# Patient Record
Sex: Female | Born: 1940
Health system: Southern US, Community
[De-identification: ages and names within clinical notes are randomized; demographics above are authoritative.]

## PROBLEM LIST (undated history)

## (undated) DIAGNOSIS — J45909 Unspecified asthma, uncomplicated: Secondary | ICD-10-CM

## (undated) DIAGNOSIS — E785 Hyperlipidemia, unspecified: Secondary | ICD-10-CM

## (undated) DIAGNOSIS — M1990 Unspecified osteoarthritis, unspecified site: Secondary | ICD-10-CM

## (undated) DIAGNOSIS — E7800 Pure hypercholesterolemia, unspecified: Secondary | ICD-10-CM

## (undated) DIAGNOSIS — E669 Obesity, unspecified: Secondary | ICD-10-CM

## (undated) DIAGNOSIS — K635 Polyp of colon: Secondary | ICD-10-CM

## (undated) DIAGNOSIS — I700 Atherosclerosis of aorta: Secondary | ICD-10-CM

## (undated) DIAGNOSIS — F32A Depression, unspecified: Secondary | ICD-10-CM

## (undated) DIAGNOSIS — E1140 Type 2 diabetes mellitus with diabetic neuropathy, unspecified: Secondary | ICD-10-CM

## (undated) DIAGNOSIS — E119 Type 2 diabetes mellitus without complications: Secondary | ICD-10-CM

## (undated) DIAGNOSIS — F329 Major depressive disorder, single episode, unspecified: Secondary | ICD-10-CM

## (undated) DIAGNOSIS — I10 Essential (primary) hypertension: Secondary | ICD-10-CM

## (undated) DIAGNOSIS — J309 Allergic rhinitis, unspecified: Secondary | ICD-10-CM

## (undated) HISTORY — DX: Major depressive disorder, single episode, unspecified: F32.9

## (undated) HISTORY — DX: Depression, unspecified: F32.A

## (undated) HISTORY — PX: KNEE SURGERY: SHX244

## (undated) HISTORY — DX: Atherosclerosis of aorta: I70.0

## (undated) HISTORY — DX: Obesity, unspecified: E66.9

## (undated) HISTORY — PX: BACK SURGERY: SHX140

## (undated) HISTORY — PX: CARDIAC CATHETERIZATION: SHX172

## (undated) HISTORY — PX: SHOULDER SURGERY: SHX246

## (undated) HISTORY — DX: Allergic rhinitis, unspecified: J30.9

## (undated) HISTORY — PX: APPENDECTOMY: SHX54

## (undated) HISTORY — DX: Type 2 diabetes mellitus with diabetic neuropathy, unspecified: E11.40

## (undated) HISTORY — DX: Unspecified osteoarthritis, unspecified site: M19.90

## (undated) HISTORY — DX: Hyperlipidemia, unspecified: E78.5

## (undated) HISTORY — PX: CHOLECYSTECTOMY: SHX55

## (undated) HISTORY — DX: Polyp of colon: K63.5

## (undated) HISTORY — PX: SPINE SURGERY: SHX786

## (undated) HISTORY — DX: Unspecified asthma, uncomplicated: J45.909

## (undated) HISTORY — PX: ABDOMINAL HYSTERECTOMY: SHX81

---

## 2016-04-05 DIAGNOSIS — M5416 Radiculopathy, lumbar region: Secondary | ICD-10-CM

## 2016-04-05 DIAGNOSIS — Z9889 Other specified postprocedural states: Secondary | ICD-10-CM

## 2016-04-06 DIAGNOSIS — M5416 Radiculopathy, lumbar region: Secondary | ICD-10-CM | POA: Diagnosis not present

## 2016-04-06 DIAGNOSIS — Z9889 Other specified postprocedural states: Secondary | ICD-10-CM | POA: Diagnosis not present

## 2016-04-30 DIAGNOSIS — M48062 Spinal stenosis, lumbar region with neurogenic claudication: Secondary | ICD-10-CM | POA: Diagnosis not present

## 2016-05-17 DIAGNOSIS — E1140 Type 2 diabetes mellitus with diabetic neuropathy, unspecified: Secondary | ICD-10-CM | POA: Diagnosis not present

## 2016-05-17 DIAGNOSIS — E119 Type 2 diabetes mellitus without complications: Secondary | ICD-10-CM | POA: Diagnosis not present

## 2016-05-17 DIAGNOSIS — M48061 Spinal stenosis, lumbar region without neurogenic claudication: Secondary | ICD-10-CM | POA: Diagnosis not present

## 2016-05-17 DIAGNOSIS — I10 Essential (primary) hypertension: Secondary | ICD-10-CM | POA: Diagnosis not present

## 2016-07-07 DIAGNOSIS — M48062 Spinal stenosis, lumbar region with neurogenic claudication: Secondary | ICD-10-CM | POA: Diagnosis not present

## 2016-07-29 DIAGNOSIS — N390 Urinary tract infection, site not specified: Secondary | ICD-10-CM | POA: Diagnosis not present

## 2016-08-01 DIAGNOSIS — J9601 Acute respiratory failure with hypoxia: Secondary | ICD-10-CM | POA: Diagnosis not present

## 2016-08-01 DIAGNOSIS — I209 Angina pectoris, unspecified: Secondary | ICD-10-CM | POA: Diagnosis not present

## 2016-08-01 DIAGNOSIS — R222 Localized swelling, mass and lump, trunk: Secondary | ICD-10-CM | POA: Diagnosis not present

## 2016-08-01 DIAGNOSIS — R5381 Other malaise: Secondary | ICD-10-CM | POA: Diagnosis not present

## 2016-08-01 DIAGNOSIS — R0989 Other specified symptoms and signs involving the circulatory and respiratory systems: Secondary | ICD-10-CM | POA: Diagnosis not present

## 2016-08-01 DIAGNOSIS — L02212 Cutaneous abscess of back [any part, except buttock]: Secondary | ICD-10-CM | POA: Diagnosis not present

## 2016-08-01 DIAGNOSIS — T814XXA Infection following a procedure, initial encounter: Secondary | ICD-10-CM | POA: Diagnosis not present

## 2016-08-01 DIAGNOSIS — N390 Urinary tract infection, site not specified: Secondary | ICD-10-CM | POA: Diagnosis not present

## 2016-08-01 DIAGNOSIS — I10 Essential (primary) hypertension: Secondary | ICD-10-CM | POA: Diagnosis not present

## 2016-08-01 DIAGNOSIS — R0789 Other chest pain: Secondary | ICD-10-CM | POA: Diagnosis not present

## 2016-08-01 DIAGNOSIS — Y838 Other surgical procedures as the cause of abnormal reaction of the patient, or of later complication, without mention of misadventure at the time of the procedure: Secondary | ICD-10-CM | POA: Diagnosis not present

## 2016-08-01 DIAGNOSIS — I674 Hypertensive encephalopathy: Secondary | ICD-10-CM | POA: Diagnosis not present

## 2016-08-01 DIAGNOSIS — R4182 Altered mental status, unspecified: Secondary | ICD-10-CM | POA: Diagnosis not present

## 2016-08-01 DIAGNOSIS — R410 Disorientation, unspecified: Secondary | ICD-10-CM | POA: Diagnosis not present

## 2016-08-01 DIAGNOSIS — E785 Hyperlipidemia, unspecified: Secondary | ICD-10-CM | POA: Diagnosis not present

## 2016-08-01 DIAGNOSIS — E669 Obesity, unspecified: Secondary | ICD-10-CM | POA: Diagnosis not present

## 2016-08-01 DIAGNOSIS — E1165 Type 2 diabetes mellitus with hyperglycemia: Secondary | ICD-10-CM | POA: Diagnosis not present

## 2016-08-01 DIAGNOSIS — M5416 Radiculopathy, lumbar region: Secondary | ICD-10-CM | POA: Diagnosis not present

## 2016-08-01 DIAGNOSIS — L7682 Other postprocedural complications of skin and subcutaneous tissue: Secondary | ICD-10-CM | POA: Diagnosis not present

## 2016-08-01 DIAGNOSIS — Z792 Long term (current) use of antibiotics: Secondary | ICD-10-CM | POA: Diagnosis not present

## 2016-08-01 DIAGNOSIS — J45909 Unspecified asthma, uncomplicated: Secondary | ICD-10-CM | POA: Diagnosis not present

## 2016-08-01 DIAGNOSIS — Z79899 Other long term (current) drug therapy: Secondary | ICD-10-CM | POA: Diagnosis not present

## 2016-08-01 DIAGNOSIS — I96 Gangrene, not elsewhere classified: Secondary | ICD-10-CM | POA: Diagnosis not present

## 2016-08-02 DIAGNOSIS — M5416 Radiculopathy, lumbar region: Secondary | ICD-10-CM | POA: Diagnosis not present

## 2016-08-02 DIAGNOSIS — T814XXA Infection following a procedure, initial encounter: Secondary | ICD-10-CM | POA: Diagnosis not present

## 2016-08-02 DIAGNOSIS — I10 Essential (primary) hypertension: Secondary | ICD-10-CM | POA: Diagnosis not present

## 2016-08-02 DIAGNOSIS — E119 Type 2 diabetes mellitus without complications: Secondary | ICD-10-CM | POA: Diagnosis not present

## 2016-08-02 DIAGNOSIS — R410 Disorientation, unspecified: Secondary | ICD-10-CM | POA: Diagnosis not present

## 2016-08-02 DIAGNOSIS — L02212 Cutaneous abscess of back [any part, except buttock]: Secondary | ICD-10-CM | POA: Diagnosis not present

## 2016-08-04 DIAGNOSIS — I10 Essential (primary) hypertension: Secondary | ICD-10-CM | POA: Diagnosis not present

## 2016-08-04 DIAGNOSIS — R410 Disorientation, unspecified: Secondary | ICD-10-CM | POA: Diagnosis not present

## 2016-08-04 DIAGNOSIS — J9601 Acute respiratory failure with hypoxia: Secondary | ICD-10-CM | POA: Diagnosis not present

## 2016-08-04 DIAGNOSIS — R0902 Hypoxemia: Secondary | ICD-10-CM | POA: Diagnosis not present

## 2016-08-04 DIAGNOSIS — L02212 Cutaneous abscess of back [any part, except buttock]: Secondary | ICD-10-CM | POA: Diagnosis not present

## 2016-08-05 DIAGNOSIS — J9601 Acute respiratory failure with hypoxia: Secondary | ICD-10-CM | POA: Diagnosis not present

## 2016-08-05 DIAGNOSIS — I10 Essential (primary) hypertension: Secondary | ICD-10-CM | POA: Diagnosis not present

## 2016-08-05 DIAGNOSIS — L02212 Cutaneous abscess of back [any part, except buttock]: Secondary | ICD-10-CM | POA: Diagnosis not present

## 2016-08-05 DIAGNOSIS — R410 Disorientation, unspecified: Secondary | ICD-10-CM | POA: Diagnosis not present

## 2016-08-06 DIAGNOSIS — M4636 Infection of intervertebral disc (pyogenic), lumbar region: Secondary | ICD-10-CM | POA: Diagnosis not present

## 2016-08-06 DIAGNOSIS — R410 Disorientation, unspecified: Secondary | ICD-10-CM | POA: Diagnosis not present

## 2016-08-06 DIAGNOSIS — I10 Essential (primary) hypertension: Secondary | ICD-10-CM | POA: Diagnosis not present

## 2016-08-06 DIAGNOSIS — L02212 Cutaneous abscess of back [any part, except buttock]: Secondary | ICD-10-CM | POA: Diagnosis not present

## 2016-08-06 DIAGNOSIS — Z452 Encounter for adjustment and management of vascular access device: Secondary | ICD-10-CM | POA: Diagnosis not present

## 2016-08-06 DIAGNOSIS — J9601 Acute respiratory failure with hypoxia: Secondary | ICD-10-CM | POA: Diagnosis not present

## 2016-08-09 DIAGNOSIS — E1142 Type 2 diabetes mellitus with diabetic polyneuropathy: Secondary | ICD-10-CM | POA: Diagnosis not present

## 2016-08-09 DIAGNOSIS — I10 Essential (primary) hypertension: Secondary | ICD-10-CM | POA: Diagnosis not present

## 2016-08-09 DIAGNOSIS — F3341 Major depressive disorder, recurrent, in partial remission: Secondary | ICD-10-CM | POA: Diagnosis not present

## 2016-08-09 DIAGNOSIS — L02212 Cutaneous abscess of back [any part, except buttock]: Secondary | ICD-10-CM | POA: Diagnosis not present

## 2016-08-10 DIAGNOSIS — E1142 Type 2 diabetes mellitus with diabetic polyneuropathy: Secondary | ICD-10-CM | POA: Diagnosis not present

## 2016-08-10 DIAGNOSIS — E782 Mixed hyperlipidemia: Secondary | ICD-10-CM | POA: Diagnosis not present

## 2016-08-10 DIAGNOSIS — F3341 Major depressive disorder, recurrent, in partial remission: Secondary | ICD-10-CM | POA: Diagnosis not present

## 2016-08-10 DIAGNOSIS — I10 Essential (primary) hypertension: Secondary | ICD-10-CM | POA: Diagnosis not present

## 2016-08-13 DIAGNOSIS — Z79891 Long term (current) use of opiate analgesic: Secondary | ICD-10-CM | POA: Diagnosis not present

## 2016-08-13 DIAGNOSIS — L02818 Cutaneous abscess of other sites: Secondary | ICD-10-CM | POA: Diagnosis not present

## 2016-08-13 DIAGNOSIS — F329 Major depressive disorder, single episode, unspecified: Secondary | ICD-10-CM | POA: Diagnosis not present

## 2016-08-13 DIAGNOSIS — J45909 Unspecified asthma, uncomplicated: Secondary | ICD-10-CM | POA: Diagnosis not present

## 2016-08-13 DIAGNOSIS — Z452 Encounter for adjustment and management of vascular access device: Secondary | ICD-10-CM | POA: Diagnosis not present

## 2016-08-13 DIAGNOSIS — Z7984 Long term (current) use of oral hypoglycemic drugs: Secondary | ICD-10-CM | POA: Diagnosis not present

## 2016-08-13 DIAGNOSIS — F3341 Major depressive disorder, recurrent, in partial remission: Secondary | ICD-10-CM | POA: Diagnosis not present

## 2016-08-13 DIAGNOSIS — Z792 Long term (current) use of antibiotics: Secondary | ICD-10-CM | POA: Diagnosis not present

## 2016-08-13 DIAGNOSIS — I10 Essential (primary) hypertension: Secondary | ICD-10-CM | POA: Diagnosis not present

## 2016-08-13 DIAGNOSIS — R410 Disorientation, unspecified: Secondary | ICD-10-CM | POA: Diagnosis not present

## 2016-08-13 DIAGNOSIS — E119 Type 2 diabetes mellitus without complications: Secondary | ICD-10-CM | POA: Diagnosis not present

## 2016-08-13 DIAGNOSIS — J9601 Acute respiratory failure with hypoxia: Secondary | ICD-10-CM | POA: Diagnosis not present

## 2016-08-13 DIAGNOSIS — L02212 Cutaneous abscess of back [any part, except buttock]: Secondary | ICD-10-CM | POA: Diagnosis not present

## 2016-08-13 DIAGNOSIS — E1142 Type 2 diabetes mellitus with diabetic polyneuropathy: Secondary | ICD-10-CM | POA: Diagnosis not present

## 2016-08-14 DIAGNOSIS — J9601 Acute respiratory failure with hypoxia: Secondary | ICD-10-CM | POA: Diagnosis not present

## 2016-08-14 DIAGNOSIS — L02818 Cutaneous abscess of other sites: Secondary | ICD-10-CM | POA: Diagnosis not present

## 2016-08-14 DIAGNOSIS — R410 Disorientation, unspecified: Secondary | ICD-10-CM | POA: Diagnosis not present

## 2016-08-15 DIAGNOSIS — J9601 Acute respiratory failure with hypoxia: Secondary | ICD-10-CM | POA: Diagnosis not present

## 2016-08-15 DIAGNOSIS — L02818 Cutaneous abscess of other sites: Secondary | ICD-10-CM | POA: Diagnosis not present

## 2016-08-15 DIAGNOSIS — R410 Disorientation, unspecified: Secondary | ICD-10-CM | POA: Diagnosis not present

## 2016-08-16 DIAGNOSIS — L02818 Cutaneous abscess of other sites: Secondary | ICD-10-CM | POA: Diagnosis not present

## 2016-08-16 DIAGNOSIS — Z5181 Encounter for therapeutic drug level monitoring: Secondary | ICD-10-CM | POA: Diagnosis not present

## 2016-08-16 DIAGNOSIS — J9601 Acute respiratory failure with hypoxia: Secondary | ICD-10-CM | POA: Diagnosis not present

## 2016-08-16 DIAGNOSIS — R410 Disorientation, unspecified: Secondary | ICD-10-CM | POA: Diagnosis not present

## 2016-08-17 DIAGNOSIS — R410 Disorientation, unspecified: Secondary | ICD-10-CM | POA: Diagnosis not present

## 2016-08-17 DIAGNOSIS — J9601 Acute respiratory failure with hypoxia: Secondary | ICD-10-CM | POA: Diagnosis not present

## 2016-08-17 DIAGNOSIS — L02818 Cutaneous abscess of other sites: Secondary | ICD-10-CM | POA: Diagnosis not present

## 2016-08-18 DIAGNOSIS — J9601 Acute respiratory failure with hypoxia: Secondary | ICD-10-CM | POA: Diagnosis not present

## 2016-08-18 DIAGNOSIS — R410 Disorientation, unspecified: Secondary | ICD-10-CM | POA: Diagnosis not present

## 2016-08-18 DIAGNOSIS — L02818 Cutaneous abscess of other sites: Secondary | ICD-10-CM | POA: Diagnosis not present

## 2016-08-19 DIAGNOSIS — R410 Disorientation, unspecified: Secondary | ICD-10-CM | POA: Diagnosis not present

## 2016-08-19 DIAGNOSIS — I10 Essential (primary) hypertension: Secondary | ICD-10-CM | POA: Diagnosis not present

## 2016-08-19 DIAGNOSIS — L02212 Cutaneous abscess of back [any part, except buttock]: Secondary | ICD-10-CM | POA: Diagnosis not present

## 2016-08-19 DIAGNOSIS — J9601 Acute respiratory failure with hypoxia: Secondary | ICD-10-CM | POA: Diagnosis not present

## 2016-08-19 DIAGNOSIS — L02818 Cutaneous abscess of other sites: Secondary | ICD-10-CM | POA: Diagnosis not present

## 2016-08-20 DIAGNOSIS — R410 Disorientation, unspecified: Secondary | ICD-10-CM | POA: Diagnosis not present

## 2016-08-20 DIAGNOSIS — L02818 Cutaneous abscess of other sites: Secondary | ICD-10-CM | POA: Diagnosis not present

## 2016-08-20 DIAGNOSIS — J9601 Acute respiratory failure with hypoxia: Secondary | ICD-10-CM | POA: Diagnosis not present

## 2016-08-21 DIAGNOSIS — J9601 Acute respiratory failure with hypoxia: Secondary | ICD-10-CM | POA: Diagnosis not present

## 2016-08-21 DIAGNOSIS — R410 Disorientation, unspecified: Secondary | ICD-10-CM | POA: Diagnosis not present

## 2016-08-21 DIAGNOSIS — L02818 Cutaneous abscess of other sites: Secondary | ICD-10-CM | POA: Diagnosis not present

## 2016-08-22 DIAGNOSIS — I10 Essential (primary) hypertension: Secondary | ICD-10-CM | POA: Diagnosis not present

## 2016-08-22 DIAGNOSIS — R0602 Shortness of breath: Secondary | ICD-10-CM | POA: Diagnosis not present

## 2016-08-22 DIAGNOSIS — R4182 Altered mental status, unspecified: Secondary | ICD-10-CM | POA: Diagnosis not present

## 2016-08-22 DIAGNOSIS — J9601 Acute respiratory failure with hypoxia: Secondary | ICD-10-CM | POA: Diagnosis not present

## 2016-08-22 DIAGNOSIS — L02818 Cutaneous abscess of other sites: Secondary | ICD-10-CM | POA: Diagnosis not present

## 2016-08-22 DIAGNOSIS — R410 Disorientation, unspecified: Secondary | ICD-10-CM | POA: Diagnosis not present

## 2016-08-22 DIAGNOSIS — E785 Hyperlipidemia, unspecified: Secondary | ICD-10-CM | POA: Diagnosis not present

## 2016-08-22 DIAGNOSIS — L02212 Cutaneous abscess of back [any part, except buttock]: Secondary | ICD-10-CM | POA: Diagnosis not present

## 2016-08-23 DIAGNOSIS — E785 Hyperlipidemia, unspecified: Secondary | ICD-10-CM | POA: Diagnosis not present

## 2016-08-23 DIAGNOSIS — E669 Obesity, unspecified: Secondary | ICD-10-CM | POA: Diagnosis not present

## 2016-08-23 DIAGNOSIS — M1990 Unspecified osteoarthritis, unspecified site: Secondary | ICD-10-CM | POA: Diagnosis not present

## 2016-08-23 DIAGNOSIS — Z888 Allergy status to other drugs, medicaments and biological substances status: Secondary | ICD-10-CM | POA: Diagnosis not present

## 2016-08-23 DIAGNOSIS — R0602 Shortness of breath: Secondary | ICD-10-CM | POA: Diagnosis not present

## 2016-08-23 DIAGNOSIS — J9601 Acute respiratory failure with hypoxia: Secondary | ICD-10-CM | POA: Diagnosis not present

## 2016-08-23 DIAGNOSIS — E1165 Type 2 diabetes mellitus with hyperglycemia: Secondary | ICD-10-CM | POA: Diagnosis not present

## 2016-08-23 DIAGNOSIS — R4182 Altered mental status, unspecified: Secondary | ICD-10-CM | POA: Diagnosis not present

## 2016-08-23 DIAGNOSIS — I10 Essential (primary) hypertension: Secondary | ICD-10-CM | POA: Diagnosis not present

## 2016-08-23 DIAGNOSIS — G9340 Encephalopathy, unspecified: Secondary | ICD-10-CM | POA: Diagnosis not present

## 2016-08-23 DIAGNOSIS — R410 Disorientation, unspecified: Secondary | ICD-10-CM | POA: Diagnosis not present

## 2016-08-23 DIAGNOSIS — Z9889 Other specified postprocedural states: Secondary | ICD-10-CM | POA: Diagnosis not present

## 2016-08-23 DIAGNOSIS — F329 Major depressive disorder, single episode, unspecified: Secondary | ICD-10-CM | POA: Diagnosis not present

## 2016-08-23 DIAGNOSIS — Z884 Allergy status to anesthetic agent status: Secondary | ICD-10-CM | POA: Diagnosis not present

## 2016-08-23 DIAGNOSIS — Z79899 Other long term (current) drug therapy: Secondary | ICD-10-CM | POA: Diagnosis not present

## 2016-08-23 DIAGNOSIS — L02212 Cutaneous abscess of back [any part, except buttock]: Secondary | ICD-10-CM | POA: Diagnosis not present

## 2016-08-23 DIAGNOSIS — K219 Gastro-esophageal reflux disease without esophagitis: Secondary | ICD-10-CM | POA: Diagnosis not present

## 2016-08-23 DIAGNOSIS — L02818 Cutaneous abscess of other sites: Secondary | ICD-10-CM | POA: Diagnosis not present

## 2016-08-24 DIAGNOSIS — J9601 Acute respiratory failure with hypoxia: Secondary | ICD-10-CM | POA: Diagnosis not present

## 2016-08-24 DIAGNOSIS — L02818 Cutaneous abscess of other sites: Secondary | ICD-10-CM | POA: Diagnosis not present

## 2016-08-24 DIAGNOSIS — E785 Hyperlipidemia, unspecified: Secondary | ICD-10-CM | POA: Diagnosis not present

## 2016-08-24 DIAGNOSIS — L02212 Cutaneous abscess of back [any part, except buttock]: Secondary | ICD-10-CM | POA: Diagnosis not present

## 2016-08-24 DIAGNOSIS — S31000A Unspecified open wound of lower back and pelvis without penetration into retroperitoneum, initial encounter: Secondary | ICD-10-CM | POA: Diagnosis not present

## 2016-08-24 DIAGNOSIS — I10 Essential (primary) hypertension: Secondary | ICD-10-CM | POA: Diagnosis not present

## 2016-08-24 DIAGNOSIS — R4182 Altered mental status, unspecified: Secondary | ICD-10-CM | POA: Diagnosis not present

## 2016-08-24 DIAGNOSIS — R410 Disorientation, unspecified: Secondary | ICD-10-CM | POA: Diagnosis not present

## 2016-08-25 DIAGNOSIS — E669 Obesity, unspecified: Secondary | ICD-10-CM | POA: Diagnosis not present

## 2016-08-25 DIAGNOSIS — L02818 Cutaneous abscess of other sites: Secondary | ICD-10-CM | POA: Diagnosis not present

## 2016-08-25 DIAGNOSIS — L02212 Cutaneous abscess of back [any part, except buttock]: Secondary | ICD-10-CM | POA: Diagnosis not present

## 2016-08-25 DIAGNOSIS — J9601 Acute respiratory failure with hypoxia: Secondary | ICD-10-CM | POA: Diagnosis not present

## 2016-08-25 DIAGNOSIS — R410 Disorientation, unspecified: Secondary | ICD-10-CM | POA: Diagnosis not present

## 2016-08-25 DIAGNOSIS — Z9889 Other specified postprocedural states: Secondary | ICD-10-CM | POA: Diagnosis not present

## 2016-08-25 DIAGNOSIS — E1165 Type 2 diabetes mellitus with hyperglycemia: Secondary | ICD-10-CM | POA: Diagnosis not present

## 2016-08-26 DIAGNOSIS — Z9889 Other specified postprocedural states: Secondary | ICD-10-CM | POA: Diagnosis not present

## 2016-08-26 DIAGNOSIS — E669 Obesity, unspecified: Secondary | ICD-10-CM | POA: Diagnosis not present

## 2016-08-26 DIAGNOSIS — L02818 Cutaneous abscess of other sites: Secondary | ICD-10-CM | POA: Diagnosis not present

## 2016-08-26 DIAGNOSIS — E1165 Type 2 diabetes mellitus with hyperglycemia: Secondary | ICD-10-CM | POA: Diagnosis not present

## 2016-08-26 DIAGNOSIS — R410 Disorientation, unspecified: Secondary | ICD-10-CM | POA: Diagnosis not present

## 2016-08-26 DIAGNOSIS — J9601 Acute respiratory failure with hypoxia: Secondary | ICD-10-CM | POA: Diagnosis not present

## 2016-08-26 DIAGNOSIS — L02212 Cutaneous abscess of back [any part, except buttock]: Secondary | ICD-10-CM | POA: Diagnosis not present

## 2016-08-28 ENCOUNTER — Inpatient Hospital Stay (HOSPITAL_COMMUNITY)
Admission: EM | Admit: 2016-08-28 | Discharge: 2016-08-29 | DRG: 948 | Disposition: A | Discharge status: Home / Self Care | Payer: PPO | Attending: Emergency Medicine | Admitting: Emergency Medicine

## 2016-08-28 ENCOUNTER — Emergency Department (HOSPITAL_COMMUNITY): Payer: PPO

## 2016-08-28 ENCOUNTER — Encounter (HOSPITAL_COMMUNITY): Payer: Self-pay | Admitting: Emergency Medicine

## 2016-08-28 DIAGNOSIS — Z539 Procedure and treatment not carried out, unspecified reason: Secondary | ICD-10-CM | POA: Diagnosis present

## 2016-08-28 DIAGNOSIS — R410 Disorientation, unspecified: Principal | ICD-10-CM | POA: Diagnosis present

## 2016-08-28 DIAGNOSIS — Z9889 Other specified postprocedural states: Secondary | ICD-10-CM

## 2016-08-28 DIAGNOSIS — R0902 Hypoxemia: Secondary | ICD-10-CM | POA: Diagnosis not present

## 2016-08-28 DIAGNOSIS — R0600 Dyspnea, unspecified: Secondary | ICD-10-CM | POA: Diagnosis not present

## 2016-08-28 DIAGNOSIS — J9600 Acute respiratory failure, unspecified whether with hypoxia or hypercapnia: Secondary | ICD-10-CM | POA: Diagnosis present

## 2016-08-28 DIAGNOSIS — M545 Low back pain: Secondary | ICD-10-CM | POA: Diagnosis not present

## 2016-08-28 HISTORY — DX: Pure hypercholesterolemia, unspecified: E78.00

## 2016-08-28 HISTORY — DX: Essential (primary) hypertension: I10

## 2016-08-28 HISTORY — DX: Type 2 diabetes mellitus without complications: E11.9

## 2016-08-28 LAB — CBC WITH DIFFERENTIAL/PLATELET
BASOS ABS: 0 10*3/uL (ref 0.0–0.1)
BASOS PCT: 0 %
EOS ABS: 0.1 10*3/uL (ref 0.0–0.7)
Eosinophils Relative: 1 %
HCT: 39.3 % (ref 36.0–46.0)
Hemoglobin: 12.5 g/dL (ref 12.0–15.0)
Lymphocytes Relative: 25 %
Lymphs Abs: 2.5 10*3/uL (ref 0.7–4.0)
MCH: 28 pg (ref 26.0–34.0)
MCHC: 31.8 g/dL (ref 30.0–36.0)
MCV: 87.9 fL (ref 78.0–100.0)
MONO ABS: 0.7 10*3/uL (ref 0.1–1.0)
Monocytes Relative: 7 %
Neutro Abs: 6.6 10*3/uL (ref 1.7–7.7)
Neutrophils Relative %: 67 %
PLATELETS: 232 10*3/uL (ref 150–400)
RBC: 4.47 MIL/uL (ref 3.87–5.11)
RDW: 14.6 % (ref 11.5–15.5)
WBC: 10 10*3/uL (ref 4.0–10.5)

## 2016-08-28 LAB — COMPREHENSIVE METABOLIC PANEL
ALK PHOS: 51 U/L (ref 38–126)
ALT: 12 U/L — AB (ref 14–54)
AST: 18 U/L (ref 15–41)
Albumin: 3.5 g/dL (ref 3.5–5.0)
Anion gap: 10 (ref 5–15)
BILIRUBIN TOTAL: 0.8 mg/dL (ref 0.3–1.2)
BUN: 19 mg/dL (ref 6–20)
CALCIUM: 9.7 mg/dL (ref 8.9–10.3)
CO2: 29 mmol/L (ref 22–32)
CREATININE: 0.94 mg/dL (ref 0.44–1.00)
Chloride: 101 mmol/L (ref 101–111)
GFR calc Af Amer: >60 mL/min (ref >60)
GFR, EST NON AFRICAN AMERICAN: 59 mL/min — AB (ref >60)
Glucose, Bld: 151 mg/dL — ABNORMAL HIGH (ref 65–99)
POTASSIUM: 3.2 mmol/L — AB (ref 3.5–5.1)
Sodium: 140 mmol/L (ref 135–145)
TOTAL PROTEIN: 7.5 g/dL (ref 6.5–8.1)

## 2016-08-28 LAB — URINALYSIS, ROUTINE W REFLEX MICROSCOPIC
Bacteria, UA: NONE SEEN
Bilirubin Urine: NEGATIVE
GLUCOSE, UA: NEGATIVE mg/dL
Hgb urine dipstick: NEGATIVE
KETONES UR: 5 mg/dL — AB
Nitrite: NEGATIVE
PH: 5 (ref 5.0–8.0)
Protein, ur: 100 mg/dL — AB
Specific Gravity, Urine: 1.03 (ref 1.005–1.030)

## 2016-08-28 LAB — I-STAT ARTERIAL BLOOD GAS, ED
ACID-BASE EXCESS: 5 mmol/L — AB (ref 0.0–2.0)
Bicarbonate: 30.1 mmol/L — ABNORMAL HIGH (ref 20.0–28.0)
O2 SAT: 99 %
PCO2 ART: 43.8 mmHg (ref 32.0–48.0)
PO2 ART: 111 mmHg — AB (ref 83.0–108.0)
Patient temperature: 97.9
TCO2: 31 mmol/L (ref 0–100)
pH, Arterial: 7.444 (ref 7.350–7.450)

## 2016-08-28 LAB — I-STAT CG4 LACTIC ACID, ED
LACTIC ACID, VENOUS: 1.21 mmol/L (ref 0.5–1.9)
Lactic Acid, Venous: 0.83 mmol/L (ref 0.5–1.9)

## 2016-08-28 LAB — PROTIME-INR
INR: 0.99
Prothrombin Time: 13.1 seconds (ref 11.4–15.2)

## 2016-08-28 MED ORDER — GADOBENATE DIMEGLUMINE 529 MG/ML IV SOLN
17.0000 mL | Freq: Once | INTRAVENOUS | Status: AC | PRN
  Administered 2016-08-28: 17 mL via INTRAVENOUS

## 2016-08-28 MED ORDER — POTASSIUM CHLORIDE CRYS ER 20 MEQ PO TBCR
40.0000 meq | EXTENDED_RELEASE_TABLET | Freq: Once | ORAL | Status: AC
  Administered 2016-08-29: 40 meq via ORAL
  Filled 2016-08-28 (×2): qty 2

## 2016-08-28 NOTE — ED Notes (Signed)
Patient transported to MRI 

## 2016-08-28 NOTE — ED Notes (Signed)
Delay in blood draw,  edp at bedside.

## 2016-08-28 NOTE — Discharge Instructions (Addendum)
You were seen in the ED for confusion.  We did not find any evidence of active infection and the MRI of your back shows continuing improvement.  You oxygen levels were a little low, but we did not find evidence of pneumonia, blood clot, or any other dangerous causes.  Please follow-up with your PCP ASAP and discuss your breathing.  You should also see a neurologist to discuss your confusion.

## 2016-08-28 NOTE — ED Provider Notes (Signed)
[facility] DEPT Provider Note   CSN: [id number] Arrival date & time: 08/28/16  1622     History   Chief Complaint Chief Complaint  Patient presents with  . Altered Mental Status  . r/o sepsis    HPI Jeanette [name] is a 76 y.o. female with history of DM, HTN, and multiple spinal surgeries (last 07/2016, c/b wound infection) who presents with subacute confusion and memory loss.  In 11/2015 she had a spinal surgery by Dr Donivan Scull of [facility] (L3-4 decompressive surgery).  The surgical site began draining pus, and she was readmitted in 03/2016 for revision for infection.  She was again readmitted for infection in 07/2016, and has been infusion cefepime BID for the past month, first and rehab and then at home for the last 2 weeks.  Current plan is for 2 more weeks of cefepime.  She presents to the ED today with some confusion and impaired concentration and memory which have been present over the last month or two, stable or perhaps slightly improving.  There was no acute change to precipitate this presentation, but a friend recommended she come here rather than [facility].    She denies headaches, falls, weakness/numbness, double vision, dysuria, dyspnea, cough, and other symptoms.   HPI  Past Medical History:  Diagnosis Date  . Diabetes mellitus without complication ([place])   . High cholesterol   . Hypertension     There are no active problems to display for this patient.   Past Surgical History:  Procedure Laterality Date  . BACK SURGERY    . KNEE SURGERY      OB History    No data available       Home Medications    Prior to Admission medications   Medication Sig Start Date End Date Taking? Authorizing Provider  ceFEPIme 2 g in dextrose 5 % 50 mL Inject 2 g into the vein every 12 (twelve) hours. FOR 38 DAYS   Yes [provider]  diclofenac (VOLTAREN) 75 MG EC tablet Take 75 mg by mouth 2 (two) times daily. 08/18/16  Yes [provider]    FLUoxetine (PROZAC) 20 MG capsule Take 20 mg by mouth daily. 07/02/16  Yes [provider]  hydrALAZINE (APRESOLINE) 100 MG tablet Take 100 mg by mouth 2 (two) times daily. 08/18/16  Yes [provider]  hydrochlorothiazide (HYDRODIURIL) 25 MG tablet Take 25 mg by mouth daily. 08/18/16  Yes [provider]  lisinopril (PRINIVIL,ZESTRIL) 40 MG tablet Take 40 mg by mouth daily. 07/06/16  Yes [provider]  metFORMIN (GLUCOPHAGE) 500 MG tablet Take 500 mg by mouth 2 (two) times daily. 08/18/16  Yes [provider]  simvastatin (ZOCOR) 40 MG tablet Take 40 mg by mouth every evening. 08/18/16  Yes [provider]    Family History No family history on file.  Social History Social History  Substance Use Topics  . Smoking status: Never Smoker  . Smokeless tobacco: Never Used  . Alcohol use No     Allergies   Lidocaine; Metoprolol; Oxycodone; and Tramadol   Review of Systems Review of Systems  Constitutional: Negative for chills and fever.  Eyes: Negative for photophobia and visual disturbance.  Respiratory: Negative for chest tightness and shortness of breath.   Cardiovascular: Negative for chest pain and palpitations.  Gastrointestinal: Negative for abdominal pain, constipation, diarrhea, nausea and vomiting.  Genitourinary: Negative for dysuria and hematuria.  Musculoskeletal: Positive for back pain. Negative for gait problem.  Skin:  Positive for wound.  Neurological: Negative for dizziness, syncope and headaches.  Psychiatric/Behavioral: Positive for behavioral problems, confusion and decreased concentration.     Physical Exam Updated Vital Signs BP (!) 146/74   Pulse 95   Temp 97.9 F (36.6 C)   Resp (!) 22   Ht 5\' 6"  (1.676 m)   Wt 84.8 kg   SpO2 (!) 87%   BMI 30.18 kg/m   Physical Exam  Constitutional: She appears well-developed and well-nourished. No distress.  Cardiovascular: Normal rate and regular rhythm.    Musculoskeletal:  Subacute lumbar incision with mild erythema, no bleeding or drainage, no signs of infection No LE edema No posterior calf tenderness  Neurological:  AO x4 Follows commands slowly Immediate recall 3/3, delayed 1/3 Cannot spell WORLD backwards Cannot add nickel, dime, and quarter Occasional echolalia Resting tremor of bilateral hands CN intact Strength and sensation intact and symmetric throughout  Psychiatric: She has a normal mood and affect. Her behavior is normal.     ED Treatments / Results  Labs (all labs ordered are listed, but only abnormal results are displayed) Labs Reviewed  COMPREHENSIVE METABOLIC PANEL - Abnormal; Notable for the following:       Result Value   Potassium 3.2 (*)    Glucose, Bld 151 (*)    ALT 12 (*)    GFR calc non Af Amer 59 (*)    All other components within normal limits  URINALYSIS, ROUTINE W REFLEX MICROSCOPIC - Abnormal; Notable for the following:    APPearance HAZY (*)    Ketones, ur 5 (*)    Protein, ur 100 (*)    Leukocytes, UA TRACE (*)    Squamous Epithelial / LPF 6-30 (*)    All other components within normal limits  I-STAT ARTERIAL BLOOD GAS, ED - Abnormal; Notable for the following:    pO2, Arterial 111.0 (*)    Bicarbonate 30.1 (*)    Acid-Base Excess 5.0 (*)    All other components within normal limits  CULTURE, BLOOD (ROUTINE X 2)  CULTURE, BLOOD (ROUTINE X 2)  CBC WITH DIFFERENTIAL/PLATELET  PROTIME-INR  I-STAT CG4 LACTIC ACID, ED  I-STAT CG4 LACTIC ACID, ED    EKG  EKG Interpretation None       Radiology Mr Lumbar Spine W Wo Contrast  Result Date: 08/28/2016 CLINICAL DATA:  Low back pain EXAM: MRI LUMBAR SPINE WITHOUT AND WITH CONTRAST TECHNIQUE: Multiplanar and multiecho pulse sequences of the lumbar spine were obtained without and with intravenous contrast. CONTRAST:  30mL MULTIHANCE GADOBENATE DIMEGLUMINE 529 MG/ML IV SOLN COMPARISON:  Lumbar spine MRI [facility] 08/24/2016  FINDINGS: Segmentation:  Standard. Alignment:  Chronic grade 1 anterolisthesis at L3-4 and L4-5 Vertebrae: Remote T12 inferior endplate fracture. No acute fracture, discitis, or aggressive bone lesion. Facet marrow edema and enhancement described below. Conus medullaris: Extends to the L1-2 disc level and appears normal. No abnormal intrathecal enhancement. Paraspinal and other soft tissues: Status post L3-4 laminectomy. Complex fluid collection in the laminectomy defect measuring 24 x 18 x 27 mm, mildly decreased from prior. This collection is likely contiguous with superior tracking subcutaneous and intermuscular collection extending to the level of the L2 spinous process, largest pocket 11 x 7 mm on axial slices. There is also inferior migration to the left, posterior to the L4-5 facet. Stable enhancement of facets at L3-4, nonspecific given the postoperative changes and degree of severe degeneration. Disc enhancement at L3-4 is at an annular fissure and protrusion and is chronic.  No indication of discitis. No epidural collection. Disc levels: T12- L1: Degenerative disc narrowing with endplate ridging. No impingement L1-L2: Mild facet spurring.  Negative disc.  No impingement L2-L3: Facet arthropathy with spurring and bilateral joint fluid. Ligamentum flavum thickening. Mild inferior foraminal disc bulging or protrusions. No impingement L3-L4: Severe facet arthropathy with anterolisthesis. Facet joints are markedly hypertrophic. There is anterolisthesis with circumferential disc bulging and biforaminal L3 compression. Spinal stenosis is severe, with minimal visible CSF. These changes are above the laminectomy defect and associated collection. L4-L5: Advanced facet arthropathy with bulky hypertrophy and anterolisthesis. The disc is circumferentially bulging. Moderate spinal stenosis. Moderate bilateral foraminal narrowing. Status post laminotomy on the left. Bilateral effacement of the subarticular recesses.  L5-S1:Degenerative disc narrowing with endplate ridging. Facet spurring. No impingement. IMPRESSION: 1. Mildly decreased postoperative fluid collection in the L3-4 laminectomy defect, 24 x 18 x 27 mm today. Likely contiguous subcutaneous/intermuscular collections also appear decreased. These are of indeterminate sterility; no epidural collection. 2. L3-4 severe facet arthropathy with anterolisthesis and disc bulging/ herniation. Chronic severe spinal and bilateral foraminal stenosis. 3. L4-5 severe facet arthropathy with anterolisthesis. Chronic moderate spinal stenosis. Bilateral subarticular recess impingement and bilateral moderate foraminal narrowing. Prior left laminotomy at this level. 4. Remote T12 compression fracture. Electronically Signed   By: Monte Fantasia M.D.   On: 08/28/2016 21:21   Dg Chest Port 1 View  Result Date: 08/28/2016 CLINICAL DATA:  Acute onset of hypoxia.  Initial encounter. EXAM: PORTABLE CHEST 1 VIEW COMPARISON:  None. FINDINGS: The lungs are hypoexpanded but appear grossly clear. There is no evidence of focal opacification, pleural effusion or pneumothorax. The cardiomediastinal silhouette is borderline enlarged. No acute osseous abnormalities are seen. A right PICC is noted ending about the mid SVC. Clips are noted within the right upper quadrant, reflecting prior cholecystectomy. IMPRESSION: Lungs hypoexpanded but grossly clear.  Borderline cardiomegaly. Electronically Signed   By: Garald Balding M.D.   On: 08/28/2016 23:10    Procedures Procedures (including critical care time)  Medications Ordered in ED Medications  potassium chloride SA (K-DUR,KLOR-CON) CR tablet 40 mEq (not administered)  gadobenate dimeglumine (MULTIHANCE) injection 17 mL (17 mLs Intravenous Contrast Given 08/28/16 2030)     Initial Impression / Assessment and Plan / ED Course  I have reviewed the triage vital signs and the nursing notes.  Pertinent labs & imaging results that were available  during my care of the patient were reviewed by me and considered in my medical decision making (see chart for details).  76 year old woman with recent history of spine surgery complicated by infection and confusion.  She has had no acute change in neurologic status, her confusion is likely delerium, perhaps associated with her antibiotics.  Afebrile, no leukocytosis, wound looks healthy, no signs of active infection.  Will order repeat imaging of L spine to evaluate for resolution vs progression of infection. -MRI L spine w wo contrast -UA Clinical Course as of Aug 29 1309  Sat Aug 28, 2016  2133 MRI with improving post-operative fluid collection in soft tissue over L3-L4 laminectomy  [MO]  2200 Mental status improved, more alert and interactive  [MO]  8768 Desat to mid 30s per RN, placed on 2L [place].  Now sats in mid 90 on RA again, no dyspnea.  Will check CXR and VBG to rule out PNA or CO2 retention, thought she has no history of pulmonary disease.  [MO]  2333 With recent surgery, borderline unexplained hypoxia, will order CTA chest  PE.  D-dimer likely to be elevated from surgeries and inflammation.  [MO]  Sun Aug 29, 2016  5885 Care transferred to Dr Venora Maples.  [MO]    Clinical Course User Index [MO] Minus Liberty, MD    Final Clinical Impressions(s) / ED Diagnoses   Final diagnoses:  History of lumbar surgery  Hypoxia   Ambulatory with desaturations, feels well, will discharged home with instructions to follow-up with PCP and neurology.  New Prescriptions New Prescriptions   No medications on file     Minus Liberty, MD 08/29/16 1311    Minus Liberty, MD 08/29/16 1312    Blanchie Dessert, MD 08/29/16 [phone]

## 2016-08-28 NOTE — ED Notes (Signed)
Pt SPO2 was at 88-90 while ambulating

## 2016-08-28 NOTE — ED Notes (Signed)
Caregiver sts upon release from [facility] pt's B12 was 40 points low and her CO was high.  They did not recheck her blood gas or B12 before they released her.  Caregiver sts she got a breathing treatment and oxygen and became more clear-headed.

## 2016-08-28 NOTE — ED Triage Notes (Addendum)
Pt here with on going confusion-- "something is just not right" -- pt d/c'd from [facility] 5/10-- for same-- had PICC line placed for home antibiotics for spinal infection post op from lumbar surgery- 4/16-- getting Cefipime q 12 hours at home.  Family is frustrated due to not having answers and having recurrent symptoms

## 2016-08-28 NOTE — ED Notes (Signed)
MRI called and is ready for patient.

## 2016-08-28 NOTE — Progress Notes (Signed)
ABG collected  

## 2016-08-29 ENCOUNTER — Encounter (HOSPITAL_COMMUNITY): Payer: Self-pay | Admitting: Radiology

## 2016-08-29 ENCOUNTER — Emergency Department (HOSPITAL_COMMUNITY): Payer: PPO

## 2016-08-29 DIAGNOSIS — J9600 Acute respiratory failure, unspecified whether with hypoxia or hypercapnia: Secondary | ICD-10-CM | POA: Diagnosis present

## 2016-08-29 DIAGNOSIS — Z539 Procedure and treatment not carried out, unspecified reason: Secondary | ICD-10-CM | POA: Diagnosis not present

## 2016-08-29 DIAGNOSIS — R410 Disorientation, unspecified: Secondary | ICD-10-CM | POA: Diagnosis not present

## 2016-08-29 DIAGNOSIS — R0600 Dyspnea, unspecified: Secondary | ICD-10-CM | POA: Diagnosis not present

## 2016-08-29 HISTORY — DX: Acute respiratory failure, unspecified whether with hypoxia or hypercapnia: J96.00

## 2016-08-29 MED ORDER — IOPAMIDOL (ISOVUE-370) INJECTION 76%
INTRAVENOUS | Status: AC
  Administered 2016-08-29: 100 mL
  Filled 2016-08-29: qty 100

## 2016-08-29 NOTE — ED Notes (Signed)
Admission orders entered in error.  Mrs. Jeanette [name] is still up for discharge.

## 2016-08-30 DIAGNOSIS — R410 Disorientation, unspecified: Secondary | ICD-10-CM | POA: Diagnosis not present

## 2016-08-30 DIAGNOSIS — J9601 Acute respiratory failure with hypoxia: Secondary | ICD-10-CM | POA: Diagnosis not present

## 2016-08-30 DIAGNOSIS — Z5181 Encounter for therapeutic drug level monitoring: Secondary | ICD-10-CM | POA: Diagnosis not present

## 2016-08-30 DIAGNOSIS — L02818 Cutaneous abscess of other sites: Secondary | ICD-10-CM | POA: Diagnosis not present

## 2016-08-31 DIAGNOSIS — R410 Disorientation, unspecified: Secondary | ICD-10-CM | POA: Diagnosis not present

## 2016-08-31 DIAGNOSIS — L02818 Cutaneous abscess of other sites: Secondary | ICD-10-CM | POA: Diagnosis not present

## 2016-08-31 DIAGNOSIS — J9601 Acute respiratory failure with hypoxia: Secondary | ICD-10-CM | POA: Diagnosis not present

## 2016-09-01 DIAGNOSIS — M2548 Effusion, other site: Secondary | ICD-10-CM | POA: Diagnosis not present

## 2016-09-01 DIAGNOSIS — M5136 Other intervertebral disc degeneration, lumbar region: Secondary | ICD-10-CM | POA: Diagnosis not present

## 2016-09-01 DIAGNOSIS — R4182 Altered mental status, unspecified: Secondary | ICD-10-CM | POA: Diagnosis not present

## 2016-09-01 DIAGNOSIS — Z792 Long term (current) use of antibiotics: Secondary | ICD-10-CM | POA: Diagnosis not present

## 2016-09-01 DIAGNOSIS — E860 Dehydration: Secondary | ICD-10-CM | POA: Diagnosis not present

## 2016-09-01 DIAGNOSIS — J9601 Acute respiratory failure with hypoxia: Secondary | ICD-10-CM | POA: Diagnosis not present

## 2016-09-01 DIAGNOSIS — D72829 Elevated white blood cell count, unspecified: Secondary | ICD-10-CM | POA: Diagnosis not present

## 2016-09-01 DIAGNOSIS — Z7984 Long term (current) use of oral hypoglycemic drugs: Secondary | ICD-10-CM | POA: Diagnosis not present

## 2016-09-01 DIAGNOSIS — L02818 Cutaneous abscess of other sites: Secondary | ICD-10-CM | POA: Diagnosis not present

## 2016-09-01 DIAGNOSIS — L02211 Cutaneous abscess of abdominal wall: Secondary | ICD-10-CM | POA: Diagnosis not present

## 2016-09-01 DIAGNOSIS — J9811 Atelectasis: Secondary | ICD-10-CM | POA: Diagnosis not present

## 2016-09-01 DIAGNOSIS — R402441 Other coma, without documented Glasgow coma scale score, or with partial score reported, in the field [EMT or ambulance]: Secondary | ICD-10-CM | POA: Diagnosis not present

## 2016-09-01 DIAGNOSIS — Z79899 Other long term (current) drug therapy: Secondary | ICD-10-CM | POA: Diagnosis not present

## 2016-09-01 DIAGNOSIS — R29898 Other symptoms and signs involving the musculoskeletal system: Secondary | ICD-10-CM | POA: Diagnosis not present

## 2016-09-01 DIAGNOSIS — M6281 Muscle weakness (generalized): Secondary | ICD-10-CM | POA: Diagnosis not present

## 2016-09-01 DIAGNOSIS — R410 Disorientation, unspecified: Secondary | ICD-10-CM | POA: Diagnosis not present

## 2016-09-01 DIAGNOSIS — R809 Proteinuria, unspecified: Secondary | ICD-10-CM | POA: Diagnosis not present

## 2016-09-01 DIAGNOSIS — G92 Toxic encephalopathy: Secondary | ICD-10-CM | POA: Diagnosis not present

## 2016-09-01 DIAGNOSIS — E119 Type 2 diabetes mellitus without complications: Secondary | ICD-10-CM | POA: Diagnosis not present

## 2016-09-01 DIAGNOSIS — J45909 Unspecified asthma, uncomplicated: Secondary | ICD-10-CM | POA: Diagnosis not present

## 2016-09-02 DIAGNOSIS — R410 Disorientation, unspecified: Secondary | ICD-10-CM | POA: Diagnosis not present

## 2016-09-02 DIAGNOSIS — J9601 Acute respiratory failure with hypoxia: Secondary | ICD-10-CM | POA: Diagnosis not present

## 2016-09-02 DIAGNOSIS — L02818 Cutaneous abscess of other sites: Secondary | ICD-10-CM | POA: Diagnosis not present

## 2016-09-02 LAB — CULTURE, BLOOD (ROUTINE X 2)
CULTURE: NO GROWTH
Culture: NO GROWTH
SPECIAL REQUESTS: ADEQUATE
Special Requests: ADEQUATE

## 2016-09-03 DIAGNOSIS — L02818 Cutaneous abscess of other sites: Secondary | ICD-10-CM | POA: Diagnosis not present

## 2016-09-03 DIAGNOSIS — J9601 Acute respiratory failure with hypoxia: Secondary | ICD-10-CM | POA: Diagnosis not present

## 2016-09-03 DIAGNOSIS — R410 Disorientation, unspecified: Secondary | ICD-10-CM | POA: Diagnosis not present

## 2016-09-04 DIAGNOSIS — K219 Gastro-esophageal reflux disease without esophagitis: Secondary | ICD-10-CM | POA: Diagnosis not present

## 2016-09-04 DIAGNOSIS — R4182 Altered mental status, unspecified: Secondary | ICD-10-CM | POA: Diagnosis not present

## 2016-09-04 DIAGNOSIS — R410 Disorientation, unspecified: Secondary | ICD-10-CM | POA: Diagnosis not present

## 2016-09-04 DIAGNOSIS — M5136 Other intervertebral disc degeneration, lumbar region: Secondary | ICD-10-CM | POA: Diagnosis not present

## 2016-09-04 DIAGNOSIS — M6281 Muscle weakness (generalized): Secondary | ICD-10-CM | POA: Diagnosis not present

## 2016-09-04 DIAGNOSIS — R809 Proteinuria, unspecified: Secondary | ICD-10-CM | POA: Diagnosis not present

## 2016-09-04 DIAGNOSIS — L02818 Cutaneous abscess of other sites: Secondary | ICD-10-CM | POA: Diagnosis not present

## 2016-09-04 DIAGNOSIS — J9601 Acute respiratory failure with hypoxia: Secondary | ICD-10-CM | POA: Diagnosis not present

## 2016-09-04 DIAGNOSIS — E119 Type 2 diabetes mellitus without complications: Secondary | ICD-10-CM | POA: Diagnosis not present

## 2016-09-04 DIAGNOSIS — R29898 Other symptoms and signs involving the musculoskeletal system: Secondary | ICD-10-CM | POA: Diagnosis not present

## 2016-09-04 DIAGNOSIS — I10 Essential (primary) hypertension: Secondary | ICD-10-CM | POA: Diagnosis not present

## 2016-09-04 DIAGNOSIS — F339 Major depressive disorder, recurrent, unspecified: Secondary | ICD-10-CM | POA: Diagnosis not present

## 2016-09-04 DIAGNOSIS — R2681 Unsteadiness on feet: Secondary | ICD-10-CM | POA: Diagnosis not present

## 2016-09-04 DIAGNOSIS — E860 Dehydration: Secondary | ICD-10-CM | POA: Diagnosis not present

## 2016-09-04 DIAGNOSIS — D72829 Elevated white blood cell count, unspecified: Secondary | ICD-10-CM | POA: Diagnosis not present

## 2016-09-04 DIAGNOSIS — L02212 Cutaneous abscess of back [any part, except buttock]: Secondary | ICD-10-CM | POA: Diagnosis not present

## 2016-09-04 DIAGNOSIS — L02211 Cutaneous abscess of abdominal wall: Secondary | ICD-10-CM | POA: Diagnosis not present

## 2016-09-04 DIAGNOSIS — E785 Hyperlipidemia, unspecified: Secondary | ICD-10-CM | POA: Diagnosis not present

## 2016-09-04 DIAGNOSIS — R489 Unspecified symbolic dysfunctions: Secondary | ICD-10-CM | POA: Diagnosis not present

## 2016-09-05 DIAGNOSIS — R410 Disorientation, unspecified: Secondary | ICD-10-CM | POA: Diagnosis not present

## 2016-09-05 DIAGNOSIS — J9601 Acute respiratory failure with hypoxia: Secondary | ICD-10-CM | POA: Diagnosis not present

## 2016-09-05 DIAGNOSIS — L02818 Cutaneous abscess of other sites: Secondary | ICD-10-CM | POA: Diagnosis not present

## 2016-09-06 DIAGNOSIS — J9601 Acute respiratory failure with hypoxia: Secondary | ICD-10-CM | POA: Diagnosis not present

## 2016-09-06 DIAGNOSIS — R410 Disorientation, unspecified: Secondary | ICD-10-CM | POA: Diagnosis not present

## 2016-09-06 DIAGNOSIS — E860 Dehydration: Secondary | ICD-10-CM | POA: Diagnosis not present

## 2016-09-06 DIAGNOSIS — L02212 Cutaneous abscess of back [any part, except buttock]: Secondary | ICD-10-CM | POA: Diagnosis not present

## 2016-09-06 DIAGNOSIS — M5136 Other intervertebral disc degeneration, lumbar region: Secondary | ICD-10-CM | POA: Diagnosis not present

## 2016-09-06 DIAGNOSIS — L02818 Cutaneous abscess of other sites: Secondary | ICD-10-CM | POA: Diagnosis not present

## 2016-09-07 DIAGNOSIS — R410 Disorientation, unspecified: Secondary | ICD-10-CM | POA: Diagnosis not present

## 2016-09-07 DIAGNOSIS — J9601 Acute respiratory failure with hypoxia: Secondary | ICD-10-CM | POA: Diagnosis not present

## 2016-09-07 DIAGNOSIS — L02818 Cutaneous abscess of other sites: Secondary | ICD-10-CM | POA: Diagnosis not present

## 2016-09-08 ENCOUNTER — Other Ambulatory Visit: Payer: Self-pay | Admitting: *Deleted

## 2016-09-08 DIAGNOSIS — L02818 Cutaneous abscess of other sites: Secondary | ICD-10-CM | POA: Diagnosis not present

## 2016-09-08 DIAGNOSIS — R410 Disorientation, unspecified: Secondary | ICD-10-CM | POA: Diagnosis not present

## 2016-09-08 DIAGNOSIS — J9601 Acute respiratory failure with hypoxia: Secondary | ICD-10-CM | POA: Diagnosis not present

## 2016-09-08 NOTE — Patient Outreach (Signed)
[facility]) Care Management  09/08/2016  KEYLANI PERLSTEIN 1941-01-27 [id number]   Met with Juan Quam, LPN, case manager at facility. She reports patient at facility for IV antibiotics. Until 5/28 then will go home. Patient was at facility before for IV antibiotics for cellulitis. She will set up home care, MD appointment, patient will get 30 days prescriptions.   Met with patient at bedside, Patient states she is married, has great support. Briefly reviewed [facility] care management program. Patient does not feel she has any needs at this time, but appreciates information. Left brochure for patient.   Plan to sign off, patient will get EMMI discharge calls. Patient has [facility] care management and RNCM contact for future reference.   Royetta Crochet. Laymond Purser, RN, BSN, [place] [phone]) Business Cell  [phone]) Toll Free Office

## 2016-09-09 DIAGNOSIS — R4182 Altered mental status, unspecified: Secondary | ICD-10-CM | POA: Diagnosis not present

## 2016-09-09 DIAGNOSIS — E119 Type 2 diabetes mellitus without complications: Secondary | ICD-10-CM | POA: Diagnosis not present

## 2016-09-09 DIAGNOSIS — R569 Unspecified convulsions: Secondary | ICD-10-CM | POA: Diagnosis not present

## 2016-09-09 DIAGNOSIS — R531 Weakness: Secondary | ICD-10-CM | POA: Diagnosis not present

## 2016-09-09 DIAGNOSIS — R410 Disorientation, unspecified: Secondary | ICD-10-CM | POA: Diagnosis not present

## 2016-09-09 DIAGNOSIS — I10 Essential (primary) hypertension: Secondary | ICD-10-CM | POA: Diagnosis not present

## 2016-09-09 DIAGNOSIS — J9601 Acute respiratory failure with hypoxia: Secondary | ICD-10-CM | POA: Diagnosis not present

## 2016-09-09 DIAGNOSIS — R0602 Shortness of breath: Secondary | ICD-10-CM | POA: Diagnosis not present

## 2016-09-09 DIAGNOSIS — L02212 Cutaneous abscess of back [any part, except buttock]: Secondary | ICD-10-CM | POA: Diagnosis not present

## 2016-09-09 DIAGNOSIS — M1990 Unspecified osteoarthritis, unspecified site: Secondary | ICD-10-CM | POA: Diagnosis not present

## 2016-09-09 DIAGNOSIS — Z884 Allergy status to anesthetic agent status: Secondary | ICD-10-CM | POA: Diagnosis not present

## 2016-09-09 DIAGNOSIS — L02818 Cutaneous abscess of other sites: Secondary | ICD-10-CM | POA: Diagnosis not present

## 2016-09-09 DIAGNOSIS — F329 Major depressive disorder, single episode, unspecified: Secondary | ICD-10-CM | POA: Diagnosis not present

## 2016-09-09 DIAGNOSIS — M5416 Radiculopathy, lumbar region: Secondary | ICD-10-CM | POA: Diagnosis not present

## 2016-09-09 DIAGNOSIS — E7800 Pure hypercholesterolemia, unspecified: Secondary | ICD-10-CM | POA: Diagnosis not present

## 2016-09-09 DIAGNOSIS — Z79899 Other long term (current) drug therapy: Secondary | ICD-10-CM | POA: Diagnosis not present

## 2016-09-09 DIAGNOSIS — Z888 Allergy status to other drugs, medicaments and biological substances status: Secondary | ICD-10-CM | POA: Diagnosis not present

## 2016-09-09 DIAGNOSIS — Z9889 Other specified postprocedural states: Secondary | ICD-10-CM | POA: Diagnosis not present

## 2016-09-09 DIAGNOSIS — A419 Sepsis, unspecified organism: Secondary | ICD-10-CM | POA: Diagnosis not present

## 2016-09-09 DIAGNOSIS — K219 Gastro-esophageal reflux disease without esophagitis: Secondary | ICD-10-CM | POA: Diagnosis not present

## 2016-09-09 DIAGNOSIS — G9340 Encephalopathy, unspecified: Secondary | ICD-10-CM | POA: Diagnosis not present

## 2016-09-10 DIAGNOSIS — L02818 Cutaneous abscess of other sites: Secondary | ICD-10-CM | POA: Diagnosis not present

## 2016-09-10 DIAGNOSIS — J9601 Acute respiratory failure with hypoxia: Secondary | ICD-10-CM | POA: Diagnosis not present

## 2016-09-10 DIAGNOSIS — R410 Disorientation, unspecified: Secondary | ICD-10-CM | POA: Diagnosis not present

## 2016-09-11 DIAGNOSIS — L02818 Cutaneous abscess of other sites: Secondary | ICD-10-CM | POA: Diagnosis not present

## 2016-09-11 DIAGNOSIS — R410 Disorientation, unspecified: Secondary | ICD-10-CM | POA: Diagnosis not present

## 2016-09-11 DIAGNOSIS — J9601 Acute respiratory failure with hypoxia: Secondary | ICD-10-CM | POA: Diagnosis not present

## 2016-09-12 DIAGNOSIS — L02818 Cutaneous abscess of other sites: Secondary | ICD-10-CM | POA: Diagnosis not present

## 2016-09-12 DIAGNOSIS — R410 Disorientation, unspecified: Secondary | ICD-10-CM | POA: Diagnosis not present

## 2016-09-12 DIAGNOSIS — J9601 Acute respiratory failure with hypoxia: Secondary | ICD-10-CM | POA: Diagnosis not present

## 2016-09-13 ENCOUNTER — Encounter (HOSPITAL_COMMUNITY): Payer: Self-pay | Admitting: Internal Medicine

## 2016-09-13 ENCOUNTER — Observation Stay (HOSPITAL_COMMUNITY)
Admission: AD | Admit: 2016-09-13 | Discharge: 2016-09-16 | Disposition: A | Discharge status: Home / Self Care | Payer: PPO | Source: Other Acute Inpatient Hospital | Attending: Internal Medicine | Admitting: Internal Medicine

## 2016-09-13 DIAGNOSIS — I10 Essential (primary) hypertension: Secondary | ICD-10-CM | POA: Diagnosis present

## 2016-09-13 DIAGNOSIS — R410 Disorientation, unspecified: Secondary | ICD-10-CM | POA: Diagnosis not present

## 2016-09-13 DIAGNOSIS — E119 Type 2 diabetes mellitus without complications: Secondary | ICD-10-CM

## 2016-09-13 DIAGNOSIS — E7800 Pure hypercholesterolemia, unspecified: Secondary | ICD-10-CM | POA: Insufficient documentation

## 2016-09-13 DIAGNOSIS — G92 Toxic encephalopathy: Secondary | ICD-10-CM | POA: Diagnosis not present

## 2016-09-13 DIAGNOSIS — M7989 Other specified soft tissue disorders: Secondary | ICD-10-CM | POA: Diagnosis present

## 2016-09-13 DIAGNOSIS — L02818 Cutaneous abscess of other sites: Secondary | ICD-10-CM | POA: Diagnosis not present

## 2016-09-13 DIAGNOSIS — Z79899 Other long term (current) drug therapy: Secondary | ICD-10-CM | POA: Diagnosis not present

## 2016-09-13 DIAGNOSIS — G9340 Encephalopathy, unspecified: Secondary | ICD-10-CM

## 2016-09-13 DIAGNOSIS — D649 Anemia, unspecified: Secondary | ICD-10-CM | POA: Diagnosis not present

## 2016-09-13 DIAGNOSIS — T814XXA Infection following a procedure, initial encounter: Secondary | ICD-10-CM | POA: Diagnosis not present

## 2016-09-13 DIAGNOSIS — L02212 Cutaneous abscess of back [any part, except buttock]: Secondary | ICD-10-CM | POA: Diagnosis present

## 2016-09-13 DIAGNOSIS — E876 Hypokalemia: Secondary | ICD-10-CM | POA: Diagnosis not present

## 2016-09-13 DIAGNOSIS — J9601 Acute respiratory failure with hypoxia: Secondary | ICD-10-CM | POA: Diagnosis not present

## 2016-09-13 HISTORY — DX: Encephalopathy, unspecified: G93.40

## 2016-09-13 HISTORY — DX: Essential (primary) hypertension: I10

## 2016-09-13 HISTORY — DX: Cutaneous abscess of back (any part, except buttock): L02.212

## 2016-09-13 HISTORY — DX: Other specified soft tissue disorders: M79.89

## 2016-09-13 HISTORY — DX: Type 2 diabetes mellitus without complications: E11.9

## 2016-09-13 LAB — GLUCOSE, CAPILLARY: Glucose-Capillary: 97 mg/dL (ref 65–99)

## 2016-09-13 MED ORDER — SODIUM CHLORIDE 0.9% FLUSH: 10.0000 mL | Freq: Two times a day (BID) | INTRAVENOUS | Status: DC

## 2016-09-13 MED ORDER — HYDROCHLOROTHIAZIDE 25 MG PO TABS
25.0000 mg | ORAL_TABLET | Freq: Every day | ORAL | Status: DC
  Administered 2016-09-14 – 2016-09-16 (×3): 25 mg via ORAL
  Filled 2016-09-13 (×3): qty 1

## 2016-09-13 MED ORDER — SODIUM CHLORIDE 0.9% FLUSH
10.0000 mL | INTRAVENOUS | Status: DC | PRN
  Administered 2016-09-14: 10 mL
  Filled 2016-09-13: qty 40

## 2016-09-13 MED ORDER — ENOXAPARIN SODIUM 40 MG/0.4ML ~~LOC~~ SOLN
40.0000 mg | SUBCUTANEOUS | Status: DC
  Administered 2016-09-14 – 2016-09-16 (×3): 40 mg via SUBCUTANEOUS
  Filled 2016-09-13 (×3): qty 0.4

## 2016-09-13 MED ORDER — HYDRALAZINE HCL 50 MG PO TABS
100.0000 mg | ORAL_TABLET | Freq: Two times a day (BID) | ORAL | Status: DC
  Administered 2016-09-13 – 2016-09-16 (×6): 100 mg via ORAL
  Filled 2016-09-13 (×6): qty 2

## 2016-09-13 MED ORDER — SIMVASTATIN 40 MG PO TABS
40.0000 mg | ORAL_TABLET | Freq: Every evening | ORAL | Status: DC
  Administered 2016-09-13 – 2016-09-15 (×3): 40 mg via ORAL
  Filled 2016-09-13 (×3): qty 1

## 2016-09-13 MED ORDER — FLUOXETINE HCL 20 MG PO CAPS
20.0000 mg | ORAL_CAPSULE | Freq: Every day | ORAL | Status: DC
  Administered 2016-09-14 – 2016-09-16 (×3): 20 mg via ORAL
  Filled 2016-09-13 (×3): qty 1

## 2016-09-13 MED ORDER — LEVETIRACETAM 500 MG PO TABS
500.0000 mg | ORAL_TABLET | Freq: Two times a day (BID) | ORAL | Status: DC
  Administered 2016-09-13 – 2016-09-14 (×3): 500 mg via ORAL
  Filled 2016-09-13 (×4): qty 1

## 2016-09-13 MED ORDER — LISINOPRIL 40 MG PO TABS
40.0000 mg | ORAL_TABLET | Freq: Every day | ORAL | Status: DC
  Administered 2016-09-14 – 2016-09-16 (×3): 40 mg via ORAL
  Filled 2016-09-13 (×3): qty 1

## 2016-09-13 MED ORDER — DICLOFENAC SODIUM 75 MG PO TBEC
75.0000 mg | DELAYED_RELEASE_TABLET | Freq: Two times a day (BID) | ORAL | Status: DC
  Administered 2016-09-14 – 2016-09-16 (×5): 75 mg via ORAL
  Filled 2016-09-13 (×6): qty 1

## 2016-09-13 MED ORDER — INSULIN ASPART 100 UNIT/ML ~~LOC~~ SOLN
0.0000 [IU] | Freq: Three times a day (TID) | SUBCUTANEOUS | Status: DC
  Administered 2016-09-14: 2 [IU] via SUBCUTANEOUS
  Administered 2016-09-14: 3 [IU] via SUBCUTANEOUS
  Administered 2016-09-15 (×2): 2 [IU] via SUBCUTANEOUS
  Administered 2016-09-15: 3 [IU] via SUBCUTANEOUS
  Administered 2016-09-16: 2 [IU] via SUBCUTANEOUS
  Administered 2016-09-16: 3 [IU] via SUBCUTANEOUS

## 2016-09-13 NOTE — H&P (Addendum)
History and Physical    Jeanette [name] WNU:[id number] DOB: 09-13-1940 DOA: 09/13/2016  PCP: [facility]  Patient coming from: [facility] (was in [facility] facility prior to that).  I have personally briefly reviewed patient's old medical records in [place]  Chief Complaint: AMS  HPI: Jeanette [name] is a 76 y.o. female with medical history significant of DM2, HTN, Lumbar laminectomy and discectomy on 12/15/15.  Admitted 04/05/16 for Seroma.  Re-admitted 08/04/16 for AMS and fluid collection / abscess at surgical site.  Underwent I+D.  Cultures were negative, patient was put on cefepime for 38 days to finish today 5/28.  Since then re-admitted from rehab facility with AMS, confusion, delirium "multiple times" (per transfer note).  MRI, EEG negative.  Narcotics were held without benefit to AMS.  Each admission AMS would improve rapidly and patient would be discharged again until this time when AMS has persisted for 4 days now since admission on 09/09/16.  Neurology has requested transfer to [facility] so patient was transferred to [facility].  Patient is confused and cant really contribute to history much.;  Review of Systems: Patient is confused and cant really give a great history or ROS.  Past Medical History:  Diagnosis Date  . Diabetes mellitus without complication ([place])   . High cholesterol   . Hypertension     Past Surgical History:  Procedure Laterality Date  . BACK SURGERY    . KNEE SURGERY       reports that she has never smoked. She has never used smokeless tobacco. She reports that she does not drink alcohol or use drugs.  Allergies  Allergen Reactions  . Lidocaine Other (See Comments)    Reaction not recalled  . Metoprolol Other (See Comments)    Reaction not recalled  . Oxycodone Other (See Comments)    Overly-sedates the patient  . Tramadol Itching    Family History  Problem Relation Age of Onset  . Hypertension Mother   . Diabetes Mother   . Stroke  Mother   . Hypertension Father   . Heart disease Father   . Diabetes Sister   . Cervical cancer Sister   . Cancer Brother     Prior to Admission medications   Medication Sig Start Date End Date Taking? Authorizing Provider  diclofenac (VOLTAREN) 75 MG EC tablet Take 75 mg by mouth 2 (two) times daily. 08/18/16   [provider]  FLUoxetine (PROZAC) 20 MG capsule Take 20 mg by mouth daily. 07/02/16   [provider]  hydrALAZINE (APRESOLINE) 100 MG tablet Take 100 mg by mouth 2 (two) times daily. 08/18/16   [provider]  hydrochlorothiazide (HYDRODIURIL) 25 MG tablet Take 25 mg by mouth daily. 08/18/16   [provider]  lisinopril (PRINIVIL,ZESTRIL) 40 MG tablet Take 40 mg by mouth daily. 07/06/16   [provider]  simvastatin (ZOCOR) 40 MG tablet Take 40 mg by mouth every evening. 08/18/16   [provider]    Physical Exam: Vitals:   09/13/16 2147  BP: (!) 183/59  Pulse: 64  Resp: 18  Temp: 99.3 F (37.4 C)  TempSrc: Oral  SpO2: 99%    Constitutional: NAD, calm, comfortable Eyes: PERRL, lids and conjunctivae normal ENMT: Mucous membranes are moist. Posterior pharynx clear of any exudate or lesions.Normal dentition.  Neck: normal, supple, no masses, no thyromegaly Respiratory: clear to auscultation bilaterally, no wheezing, no crackles. Normal respiratory effort. No accessory muscle use.  Cardiovascular: Regular rate and rhythm,  no murmurs / rubs / gallops. No extremity edema. 2+ pedal pulses. No carotid bruits.  Abdomen: no tenderness, no masses palpated. No hepatosplenomegaly. Bowel sounds positive.  Musculoskeletal: no clubbing / cyanosis. No joint deformity upper and lower extremities. Good ROM, no contractures. Normal muscle tone.  Skin: no rashes, lesions, ulcers. No induration Neurologic: CN 2-12 grossly intact. Sensation intact, DTR normal. Strength 5/5 in all 4.  Psychiatric: confused, not oriented   Labs on  Admission: I have personally reviewed following labs and imaging studies  CBC: No results for input(s): WBC, NEUTROABS, HGB, HCT, MCV, PLT in the last 168 hours. Basic Metabolic Panel: No results for input(s): NA, K, CL, CO2, GLUCOSE, BUN, CREATININE, CALCIUM, MG, PHOS in the last 168 hours. GFR: CrCl cannot be calculated (Unknown ideal weight.). Liver Function Tests: No results for input(s): AST, ALT, ALKPHOS, BILITOT, PROT, ALBUMIN in the last 168 hours. No results for input(s): LIPASE, AMYLASE in the last 168 hours. No results for input(s): AMMONIA in the last 168 hours. Coagulation Profile: No results for input(s): INR, PROTIME in the last 168 hours. Cardiac Enzymes: No results for input(s): CKTOTAL, CKMB, CKMBINDEX, TROPONINI in the last 168 hours. BNP (last 3 results) No results for input(s): PROBNP in the last 8760 hours. HbA1C: No results for input(s): HGBA1C in the last 72 hours. CBG: No results for input(s): GLUCAP in the last 168 hours. Lipid Profile: No results for input(s): CHOL, HDL, LDLCALC, TRIG, CHOLHDL, LDLDIRECT in the last 72 hours. Thyroid Function Tests: No results for input(s): TSH, T4TOTAL, FREET4, T3FREE, THYROIDAB in the last 72 hours. Anemia Panel: No results for input(s): VITAMINB12, FOLATE, FERRITIN, TIBC, IRON, RETICCTPCT in the last 72 hours. Urine analysis:    Component Value Date/Time   COLORURINE YELLOW 08/28/2016 2337   APPEARANCEUR HAZY (A) 08/28/2016 2337   LABSPEC 1.030 08/28/2016 2337   PHURINE 5.0 08/28/2016 [place] 08/28/2016 2337   HGBUR NEGATIVE 08/28/2016 2337   BILIRUBINUR NEGATIVE 08/28/2016 2337   KETONESUR 5 (A) 08/28/2016 2337   PROTEINUR 100 (A) 08/28/2016 2337   NITRITE NEGATIVE 08/28/2016 2337   LEUKOCYTESUR TRACE (A) 08/28/2016 2337    Radiological Exams on Admission: No results found.  EKG: Independently reviewed.  Assessment/Plan Principal Problem:   Encephalopathy Active Problems:   DM2  (diabetes mellitus, type 2) (HCC)   HTN (hypertension)   Abscess of lower back   Fat necrosis of skin    1. Encephalopathy - 1. Call Neuro in AM 2. Have put in for MRI and continuous EEG monitoring as requested by Dr. Leonel Ramsay according to the sign out from my daytime partner. 2. Abscess of back and surgical site - 1. Cultures negative 2. Just finished 38 days of cefepime with last dose being given earlier today 3. Per discharge summary: MRI at [facility] showed that this was improving / responding to cefepime. 3. Fat necrosis of abdomen - apparently benign per [facility] summary (looks more like yeast infection to me, will order nystatin). 4. HTN - Continue home BP meds 5. DM2 - mod scale SSI AC  DVT prophylaxis: Lovenox Code Status: Full Family Communication: No family in room Disposition Plan: TBD Consults called: None, call neurology in AM Admission status: Admit to inpatient   [place], [place] [facility] Pager [phone]  If 7AM-7PM, please contact day team taking care of patient [email or web address] Password [email or web address]  09/13/2016, 10:18 PM

## 2016-09-14 ENCOUNTER — Ambulatory Visit: Payer: PPO | Admitting: Internal Medicine

## 2016-09-14 ENCOUNTER — Inpatient Hospital Stay (HOSPITAL_COMMUNITY): Payer: PPO

## 2016-09-14 ENCOUNTER — Encounter (HOSPITAL_COMMUNITY): Payer: PPO

## 2016-09-14 DIAGNOSIS — G92 Toxic encephalopathy: Secondary | ICD-10-CM | POA: Diagnosis not present

## 2016-09-14 DIAGNOSIS — E119 Type 2 diabetes mellitus without complications: Secondary | ICD-10-CM | POA: Diagnosis not present

## 2016-09-14 DIAGNOSIS — G9340 Encephalopathy, unspecified: Secondary | ICD-10-CM

## 2016-09-14 DIAGNOSIS — L02212 Cutaneous abscess of back [any part, except buttock]: Secondary | ICD-10-CM | POA: Diagnosis not present

## 2016-09-14 DIAGNOSIS — M7989 Other specified soft tissue disorders: Secondary | ICD-10-CM | POA: Diagnosis not present

## 2016-09-14 DIAGNOSIS — I10 Essential (primary) hypertension: Secondary | ICD-10-CM | POA: Diagnosis not present

## 2016-09-14 LAB — URINALYSIS, COMPLETE (UACMP) WITH MICROSCOPIC
BILIRUBIN URINE: NEGATIVE
Glucose, UA: NEGATIVE mg/dL
Hgb urine dipstick: NEGATIVE
KETONES UR: 5 mg/dL — AB
NITRITE: NEGATIVE
PH: 7 (ref 5.0–8.0)
PROTEIN: 30 mg/dL — AB
Specific Gravity, Urine: 1.011 (ref 1.005–1.030)

## 2016-09-14 LAB — COMPREHENSIVE METABOLIC PANEL
ALT: 11 U/L — ABNORMAL LOW (ref 14–54)
ANION GAP: 10 (ref 5–15)
AST: 16 U/L (ref 15–41)
Albumin: 2.9 g/dL — ABNORMAL LOW (ref 3.5–5.0)
Alkaline Phosphatase: 43 U/L (ref 38–126)
BUN: 6 mg/dL (ref 6–20)
CHLORIDE: 97 mmol/L — AB (ref 101–111)
CO2: 32 mmol/L (ref 22–32)
Calcium: 9 mg/dL (ref 8.9–10.3)
Creatinine, Ser: 0.63 mg/dL (ref 0.44–1.00)
GFR calc Af Amer: >60 mL/min (ref >60)
GFR calc non Af Amer: >60 mL/min (ref >60)
GLUCOSE: 97 mg/dL (ref 65–99)
POTASSIUM: 3.1 mmol/L — AB (ref 3.5–5.1)
SODIUM: 139 mmol/L (ref 135–145)
Total Bilirubin: 0.7 mg/dL (ref 0.3–1.2)
Total Protein: 6.3 g/dL — ABNORMAL LOW (ref 6.5–8.1)

## 2016-09-14 LAB — GLUCOSE, CAPILLARY
GLUCOSE-CAPILLARY: 144 mg/dL — AB (ref 65–99)
Glucose-Capillary: 97 mg/dL (ref 65–99)
Glucose-Capillary: 153 mg/dL — ABNORMAL HIGH (ref 65–99)
Glucose-Capillary: 130 mg/dL — ABNORMAL HIGH (ref 65–99)

## 2016-09-14 LAB — CBC
HCT: 35.5 % — ABNORMAL LOW (ref 36.0–46.0)
Hemoglobin: 11.2 g/dL — ABNORMAL LOW (ref 12.0–15.0)
MCH: 27.2 pg (ref 26.0–34.0)
MCHC: 31.5 g/dL (ref 30.0–36.0)
MCV: 86.2 fL (ref 78.0–100.0)
Platelets: 193 10*3/uL (ref 150–400)
RBC: 4.12 MIL/uL (ref 3.87–5.11)
RDW: 14.3 % (ref 11.5–15.5)
WBC: 5.6 10*3/uL (ref 4.0–10.5)

## 2016-09-14 LAB — VITAMIN B12: VITAMIN B 12: 256 pg/mL (ref 180–914)

## 2016-09-14 LAB — T4, FREE: FREE T4: 1.28 ng/dL — AB (ref 0.61–1.12)

## 2016-09-14 LAB — MRSA PCR SCREENING: MRSA BY PCR: NEGATIVE

## 2016-09-14 LAB — AMMONIA: Ammonia: 25 umol/L (ref 9–35)

## 2016-09-14 LAB — TSH: TSH: 3.121 u[IU]/mL (ref 0.350–4.500)

## 2016-09-14 MED ORDER — GADOBENATE DIMEGLUMINE 529 MG/ML IV SOLN
20.0000 mL | Freq: Once | INTRAVENOUS | Status: AC
  Administered 2016-09-14: 17 mL via INTRAVENOUS

## 2016-09-14 MED ORDER — FOLIC ACID 1 MG PO TABS
1.0000 mg | ORAL_TABLET | Freq: Every day | ORAL | Status: DC
  Administered 2016-09-14 – 2016-09-16 (×3): 1 mg via ORAL
  Filled 2016-09-14 (×3): qty 1

## 2016-09-14 MED ORDER — VITAMIN B-1 100 MG PO TABS
100.0000 mg | ORAL_TABLET | Freq: Every day | ORAL | Status: DC
  Administered 2016-09-14 – 2016-09-16 (×3): 100 mg via ORAL
  Filled 2016-09-14 (×3): qty 1

## 2016-09-14 NOTE — Consult Note (Signed)
   [facility] CM Inpatient Consult   09/14/2016  Jeanette [name] 24-Apr-1940 [id number]  Patient assessed for admission in the [place], and recently from a skilled facility, from [facility] and had been seen by [place].  Patient admitted with Encpheloopathy with ongoing confusion.  Went by to see the patient and she was sound asleep.  No family currently at the bedside.  Will follow up for progress with inpatient RNCM.  For questions, please contact:  Natividad Brood, RN BSN [facility] Liaison  [phone] business mobile phone Toll free office [phone]

## 2016-09-14 NOTE — Progress Notes (Signed)
EEG Completed; Results Pending  

## 2016-09-14 NOTE — Progress Notes (Signed)
PT Cancellation Note  Patient Details Name: Jeanette [name] MRN: [id number] DOB: Jul 15, 1940   Cancelled Treatment:    Reason Eval/Treat Not Completed: Patient at procedure or test/unavailable   Shary Decamp [facility] 09/14/2016, 12:08 PM Ande Therrell PT [phone]

## 2016-09-14 NOTE — Procedures (Signed)
ELECTROENCEPHALOGRAM REPORT  Date of Study: 09/14/2016  Patient's Name: Jeanette [name] MRN: [id number] Date of Birth: 1940-07-25  Referring Provider: Marliss Coots, PA-C  Clinical History: 76 year old woman with confusion.  Medications: diclofenac (VOLTAREN) EC tablet 75 mg  enoxaparin (LOVENOX) injection 40 mg  FLUoxetine (PROZAC) capsule 20 mg  hydrALAZINE (APRESOLINE) tablet 100 mg  hydrochlorothiazide (HYDRODIURIL) tablet 25 mg  insulin aspart (novoLOG) injection 0-15 Units  levETIRAcetam (KEPPRA) tablet 500 mg  lisinopril (PRINIVIL,ZESTRIL) tablet 40 mg  simvastatin (ZOCOR) tablet 40 mg  Technical Summary: A multichannel digital EEG recording measured by the international 10-20 system with electrodes applied with paste and impedances below 5000 ohms performed as portable with EKG monitoring in an awake and drowsy patient.  Hyperventilation and photic stimulation were not performed.  The digital EEG was referentially recorded, reformatted, and digitally filtered in a variety of bipolar and referential montages for optimal display.   Description: The patient is awake and drowsy during the recording.  During maximal wakefulness, there is a symmetric, medium voltage 8 Hz posterior dominant rhythm that attenuates with eye opening. This is admixed with a small amount of diffuse 4-5 Hz theta and 2-3 Hz delta slowing of the waking background.  During drowsiness, there is an increase in theta slowing of the background.  Stage 2 sleep is not seen.  There were no epileptiform discharges or electrographic seizures seen.    EKG lead was unremarkable.  Impression: This awake and drowsy EEG is abnormal due to diffuse slowing of the waking background.  Clinical Correlation of the above findings indicates diffuse cerebral dysfunction that is non-specific in etiology and can be seen with hypoxic/ischemic injury, toxic/metabolic encephalopathies, neurodegenerative disorders, or medication  effect.  The absence of epileptiform discharges does not rule out a clinical diagnosis of epilepsy.  Clinical correlation is advised.   Metta Clines, DO

## 2016-09-14 NOTE — Clinical Social Work Note (Signed)
Clinical Social Work Assessment  Patient Details  Name: Jeanette [name] MRN: [id number] Date of Birth: [birth year]  Date of referral:  09/14/16               Reason for consult:  Facility Placement                Permission sought to share information with:  Chartered certified accountant granted to share information::  Yes, Verbal Permission Granted  Name::     Jeanette [name]  Agency::  SNF  Relationship::  spouse  Contact Information:     Housing/Transportation Living arrangements for the past 2 months:  Single Family Home Source of Information:  Spouse Patient Interpreter Needed:  None Criminal Activity/Legal Involvement Pertinent to Current Situation/Hospitalization:  No - Comment as needed Significant Relationships:  Spouse Lives with:  Spouse Do you feel safe going back to the place where you live?  No Need for family participation in patient care:  Yes (Comment) (decision making at this time)  Care giving concerns:  Pt from [facility]- no concerns expressed but pt spouse does not think pt is benefiting from rehab stay.   Social Worker assessment / plan:  CSW spoke with pt spouse about pt disposition planning.  Pt states that pt has had several admissions recently- was at [facility] and [facility] [place] [facility]- pt then readmitted to hospital from SNF.  Pt currently not DC'd from SNF. CSW discussed plan for DC and possible recommendation for return to SNF.  Employment status:  Retired Nurse, adult PT Recommendations:  Not assessed at this time Information / Referral to community resources:  [place]  Patient/Family's Response to care:  Pt spouse hopeful to take patient home when stable for DC- does not think rehab stay has benefited her and he can be available 24 hours a day.  Patient/Family's Understanding of and Emotional Response to Diagnosis, Current Treatment, and Prognosis:  Pt spouse expresses  good understanding of pt condition- hopeful pt will be well enough to return home at time of DC.  Emotional Assessment Appearance:  Appears stated age Attitude/Demeanor/Rapport:  Unable to Assess Affect (typically observed):  Unable to Assess Orientation:  Oriented to Self Alcohol / Substance use:  Not Applicable Psych involvement (Current and /or in the community):  No (Comment)  Discharge Needs  Concerns to be addressed:  Care Coordination Readmission within the last 30 days:  No Current discharge risk:  Physical Impairment Barriers to Discharge:  Continued Medical Work up   Jorge Ny, LCSW 09/14/2016, 4:22 PM

## 2016-09-14 NOTE — Consult Note (Signed)
NEURO HOSPITALIST CONSULT NOTE   Requesting physician: Dr. Posey Pronto  Reason for Consult: Encephalopathy  History obtained from:  Patient, husband, chart review  HPI:                                                                                                                                          Jeanette [name] is a 76 y.o. female who presented [facility] for further neurological evaluation second to non-resolving confusion x 4 days. Per the husband and chart review, Jeanette [name] had undergone back surgery for an infection at the surgical site on 16Apr2018. She was placed on cefepime immediately following surgery (x 38 days) - last dose yesterday, [id number]. Since the surgery, she has had periods of increased confusion that began one to two weeks following the surgery, per the husband. These have led to hospitalization with improvement in her mental status; however, the most recent hospitalization was unsuccessful in clearing her altered mental status. Her husband notes that she is more alert today than she has been over the past month or so but still confused and slow to respond.   Past medical history is significant for back surgery x 4, diabetes mellitus type two, hypertension and high cholesterol.   Husband present at bedside. MR brain W/W/O and spot EEG pending  Past Medical History:  Diagnosis Date  . Diabetes mellitus without complication ([place])   . High cholesterol   . Hypertension     Past Surgical History:  Procedure Laterality Date  . BACK SURGERY    . KNEE SURGERY      Family History  Problem Relation Age of Onset  . Hypertension Mother   . Diabetes Mother   . Stroke Mother   . Hypertension Father   . Heart disease Father   . Diabetes Sister   . Cervical cancer Sister   . Cancer Brother     Social History:  reports that she has never smoked. She has never used smokeless tobacco. She reports that she does not drink  alcohol or use drugs.  Allergies  Allergen Reactions  . Lidocaine Other (See Comments)    Reaction not recalled  . Metoprolol Other (See Comments)    Reaction not recalled  . Oxycodone Other (See Comments)    Overly-sedates the patient  . Tramadol Itching    MEDICATIONS:  Current Facility-Administered Medications:  .  diclofenac (VOLTAREN) EC tablet 75 mg, 75 mg, Oral, BID WC, Gardner, Jared M, DO, 75 mg at 09/14/16 1734 .  enoxaparin (LOVENOX) injection 40 mg, 40 mg, Subcutaneous, Q24H, Gardner, Jared M, DO, 40 mg at 09/14/16 0737 .  FLUoxetine (PROZAC) capsule 20 mg, 20 mg, Oral, Daily, Jennette Kettle M, DO, 20 mg at 09/14/16 0949 .  folic acid (FOLVITE) tablet 1 mg, 1 mg, Oral, Daily, Lavina Hamman, MD, 1 mg at 09/14/16 1734 .  hydrALAZINE (APRESOLINE) tablet 100 mg, 100 mg, Oral, BID, Jennette Kettle M, DO, 100 mg at 09/14/16 6314 .  hydrochlorothiazide (HYDRODIURIL) tablet 25 mg, 25 mg, Oral, Daily, Jennette Kettle M, DO, 25 mg at 09/14/16 0949 .  insulin aspart (novoLOG) injection 0-15 Units, 0-15 Units, Subcutaneous, TID WC, Etta Quill, DO, 3 Units at 09/14/16 1736 .  levETIRAcetam (KEPPRA) tablet 500 mg, 500 mg, Oral, BID, Etta Quill, DO, 500 mg at 09/14/16 0949 .  lisinopril (PRINIVIL,ZESTRIL) tablet 40 mg, 40 mg, Oral, Daily, Etta Quill, DO, 40 mg at 09/14/16 0949 .  simvastatin (ZOCOR) tablet 40 mg, 40 mg, Oral, QPM, Etta Quill, DO, 40 mg at 09/14/16 1734 .  sodium chloride flush (NS) 0.9 % injection 10-40 mL, 10-40 mL, Intracatheter, Q12H, Waldemar Dickens, MD .  sodium chloride flush (NS) 0.9 % injection 10-40 mL, 10-40 mL, Intracatheter, PRN, Waldemar Dickens, MD, 10 mL at 09/14/16 1606 .  thiamine (VITAMIN B-1) tablet 100 mg, 100 mg, Oral, Daily, Lavina Hamman, MD, 100 mg at 09/14/16 1734    Review Of Systems:                                                                                                            History obtained from unobtainable from patient due to mental status   Blood pressure (!) 183/59, pulse 64, temperature 99.3 F (37.4 C), temperature source Oral, resp. rate 18, height 5\' 6"  (1.676 m), weight 80.5 kg (177 lb 8 oz), SpO2 99 %.   Physical Examination:                                                                                                      General: WDWN female. Appears calm and comfortable in bed.  HEENT:  Normocephalic, no lesions, without obvious abnormality.  Normal external eye and conjunctiva.  Normal external ears. Normal external nose, mucus membranes and septum.  Normal pharynx. Cardiovascular: S1, S2 normal, pulses palpable throughout   Pulmonary: chest clear, no wheezing, rales, normal symmetric air entry Abdomen: soft Extremities: no joint deformities, effusion, or  inflammation. Scars from surgery on knees bilaterally. Musculoskeletal: no joint tenderness, deformity or swelling. Tone and bulk normal, no atrophy or fasciculations noted Skin: warm and dry, no hyperpigmentation, vitiligo, or suspicious lesions  Neurological Examination:                                                                                               Mental Status: Jeanette [name] is alert and cooperative but not oriented. She knows she is at [facility] and knows her husband is at bedside but is unable to tell the month, year or how long they've been married. She can not follow two step commands correctly (touches right ear when asked to touch left)- or three- step commands. Often stares at practitioner and then states she does not understand what is being asked of her. She moves very slowly when responding to simple commands. Exam was confounded by the inability of Jeanette [name] to understand the given instructions. Cranial Nerves: II: Visual fields grossly normal, pupils are equal, round, reactive to  light  III,IV, VI: Ptosis not present, extra-ocular muscle movements intact bilaterally V,VII: Face is symmetric. Facial light touch sensation intact bilaterally VIII: Hearing grossly intact IX,X: Uvula and palate rise symmetrically XI: Able to adjust position in bed XII: Midline tongue extension Motor: Moves all extremities against gravity, no drift,  no tremor or asterixis noted. Sensory: Pinprick and light touch intact throughout, bilaterally Deep Tendon Reflexes: 2+ and symmetric throughout BUE and LLE. Dropped LLE KJ with 2+ on the right. No AJ Plantars: Right: downgoing   Left: downgoing Cerebellar: Able to touch nose with both hands from outstretched arms without evidence of dysmetria or ataxia.   Gait: Not tested   Lab Results: Basic Metabolic Panel:  Recent Labs Lab 09/14/16 0338  NA 139  K 3.1*  CL 97*  CO2 32  GLUCOSE 97  BUN 6  CREATININE 0.63  CALCIUM 9.0    Liver Function Tests:  Recent Labs Lab 09/14/16 0338  AST 16  ALT 11*  ALKPHOS 43  BILITOT 0.7  PROT 6.3*  ALBUMIN 2.9*   No results for input(s): LIPASE, AMYLASE in the last 168 hours. No results for input(s): AMMONIA in the last 168 hours.  CBC:  Recent Labs Lab 09/14/16 0338  WBC 5.6  HGB 11.2*  HCT 35.5*  MCV 86.2  PLT 193    Cardiac Enzymes: No results for input(s): CKTOTAL, CKMB, CKMBINDEX, TROPONINI in the last 168 hours.  Lipid Panel: No results for input(s): CHOL, TRIG, HDL, CHOLHDL, VLDL, LDLCALC in the last 168 hours.  CBG:  Recent Labs Lab 09/13/16 2331 09/14/16 0810  GLUCAP 97 97    Microbiology: Results for orders placed or performed during the hospital encounter of 09/13/16  MRSA PCR Screening     Status: None   Collection Time: 09/13/16 11:05 PM  Result Value Ref Range Status   MRSA by PCR NEGATIVE NEGATIVE Final    Comment:        The GeneXpert MRSA Assay (FDA approved for NASAL specimens only), is one component of a comprehensive MRSA  colonization surveillance program.  It is not intended to diagnose MRSA infection nor to guide or monitor treatment for MRSA infections.     Coagulation Studies: No results for input(s): LABPROT, INR in the last 72 hours.  Imaging: No results found.   Thank you for consulting the [facility] team. Assessment and plan per attending neurologist.   Solon Augusta PA-C [facility]  09/14/2016, 10:41 AM  Assessment: 76 year old female with intermittent AMS 1. Most likely due to encephalopathy secondary to cefepime side effect 2. Lower on DDx is seizure or stroke  Plan: 1. MRI brain 2. EEG 3. Monitor for possible improvement off cefepime 4. Discontinue Keppra if EEG is negative  Electronically signed: Dr. Kerney Elbe

## 2016-09-14 NOTE — Progress Notes (Signed)
[facility] Progress Note  Patient: Jeanette [name] MEQ:[id number]   PCP: [facility] DOB: [birth year]   DOA: 09/13/2016   DOS: 09/14/2016   Date of Service: the patient was seen and examined on 09/14/2016  Subjective: Per family patient has improvement in her mentation. Patient is able to communicate and admitted that she is in the hospital and knows her husband's name. Denies any acute complaint of nausea vomiting chest and abdominal pain diarrhea or constipation. No fever or chills reported.  Brief hospital course: Pt. with PMH of type II DM, HTN, lumbar laminectomy 8 2017 followed by seroma, followed by infection at the surgical site requiring prolonged antibiotics with IV cefepime finished on 09/13/2016; admitted on 09/13/2016, presented with complaint of confusion, was found to have acute encephalopathy. Currently further plan is further workup.  Assessment and Plan: 1. Acute encephalopathy. Likely due to cefepime. Neurology on board. Repeat MRI ordered. EEG shows evidence of metabolic encephalopathy without any active seizures. Family thinks that the patient is showing improvement. Per discharge summary the patient has waxing and waning mental status changes since recent admission at which time she was started on cefepime. We will follow neurology recommendation. Get B-12 TSH ammonia 54 for further workup. Neurology no indication for continuous EEG monitoring or LP at present.  2. Abscess of the surgical site for lumbar laminectomy. Completed 38 days of IV cefepime. No indication for further antibiotics at present. Cultures are negative so far. No active infection identified. We will monitor.  3. Essential hypertension. Continuing home management  4. Type 2 diabetes mellitus.  sliding scale insulin.  Diet: Heart modified diet DVT Prophylaxis: subcutaneous Heparin  Advance goals of care discussion: Full code  Family Communication: family was present at bedside, at the  time of interview. The pt provided permission to discuss medical plan with the family. Opportunity was given to ask question and all questions were answered satisfactorily.   Disposition:  Discharge to back to SNF.  Consultants: Neurology Procedures: EEG  Antibiotics: Anti-infectives    None       Objective: Physical Exam: Vitals:   09/13/16 2147 09/14/16 0100 09/14/16 1409  BP: (!) 183/59  (!) 148/83  Pulse: 64  92  Resp: 18  20  Temp: 99.3 F (37.4 C)  98.7 F (37.1 C)  TempSrc: Oral  Oral  SpO2: 99%  96%  Weight:  80.5 kg (177 lb 8 oz)   Height:  5\' 6"  (1.676 m)     Intake/Output Summary (Last 24 hours) at 09/14/16 1758 Last data filed at 09/14/16 1737  Gross per 24 hour  Intake              488 ml  Output              250 ml  Net              238 ml   Filed Weights   09/14/16 0100  Weight: 80.5 kg (177 lb 8 oz)   General: Alert, Awake and Oriented to Time, Place and Person. Appear in mild distress, affect flat Eyes: PERRL, Conjunctiva normal ENT: Oral Mucosa clear moist. Neck: difficult to assess JVD, no Abnormal Mass Or lumps Cardiovascular: S1 and S2 Present, no Murmur, Peripheral Pulses Present Respiratory: normal respiratory effort, Bilateral Air entry equal and Decreased, no use of accessory muscle, Clear to Auscultation, no Crackles, no wheezes Abdomen: Bowel Sound present, Soft and no tenderness,  Skin: no redness, no Rash, no induration Extremities: no Pedal  edema, no calf tenderness Neurologic: Grossly no focal neuro deficit. Bilaterally Equal motor strength No asterixis  Data Reviewed: CBC:  Recent Labs Lab 09/14/16 0338  WBC 5.6  HGB 11.2*  HCT 35.5*  MCV 86.2  PLT 263   Basic Metabolic Panel:  Recent Labs Lab 09/14/16 0338  NA 139  K 3.1*  CL 97*  CO2 32  GLUCOSE 97  BUN 6  CREATININE 0.63  CALCIUM 9.0    Liver Function Tests:  Recent Labs Lab 09/14/16 0338  AST 16  ALT 11*  ALKPHOS 43  BILITOT 0.7  PROT 6.3*    ALBUMIN 2.9*   No results for input(s): LIPASE, AMYLASE in the last 168 hours. No results for input(s): AMMONIA in the last 168 hours. Coagulation Profile: No results for input(s): INR, PROTIME in the last 168 hours. Cardiac Enzymes: No results for input(s): CKTOTAL, CKMB, CKMBINDEX, TROPONINI in the last 168 hours. BNP (last 3 results) No results for input(s): PROBNP in the last 8760 hours. CBG:  Recent Labs Lab 09/13/16 2331 09/14/16 0810 09/14/16 1138 09/14/16 1706  GLUCAP 97 97 144* 153*   Studies: Mr Jeri Cos FH Contrast  Result Date: 09/14/2016 CLINICAL DATA:  Encephalopathy. Recent treatment for spinal surgical site infection EXAM: MRI HEAD WITHOUT AND WITH CONTRAST TECHNIQUE: Multiplanar, multiecho pulse sequences of the brain and surrounding structures were obtained without and with intravenous contrast. CONTRAST:  27mL MULTIHANCE GADOBENATE DIMEGLUMINE 529 MG/ML IV SOLN COMPARISON:  08/23/2016 FINDINGS: Brain: No acute infarction, hemorrhage, hydrocephalus, extra-axial collection or mass lesion. Prominent FLAIR hyperintensity in the pons brainstem and bilateral thalamus. No restricted diffusion or enhancement to suggest acute demyelination. This may reflect advanced chronic small vessel ischemia. More mild ot moderate chronic microvascular ischemic type change seen in the cerebral white matter. Two small remote right cerebellar infarcts. No abnormal intracranial enhancement. Vascular: Major flow voids are preserved. Skull and upper cervical spine: No evidence of marrow lesion. Retro dental ligamentous thickening Sinuses/Orbits: Right sphenoid sinusitis with mucosal thickening and fluid level, interval compared to head CT 5 days prior. Other: Intermittently motion degraded, likely best obtainable in this altered patient. IMPRESSION: 1. No acute finding or change from 08/23/2016. 2. Unchanged prominent signal abnormality in the pons and thalami which could be sequela of chronic small  vessel ischemia or osmotic demyelination. 3. Small-vessel type changes in the cerebral white matter are more mild to moderate. Electronically Signed   By: Monte Fantasia M.D.   On: 09/14/2016 15:54    Scheduled Meds: . diclofenac  75 mg Oral BID WC  . enoxaparin (LOVENOX) injection  40 mg Subcutaneous Q24H  . FLUoxetine  20 mg Oral Daily  . folic acid  1 mg Oral Daily  . hydrALAZINE  100 mg Oral BID  . hydrochlorothiazide  25 mg Oral Daily  . insulin aspart  0-15 Units Subcutaneous TID WC  . levETIRAcetam  500 mg Oral BID  . lisinopril  40 mg Oral Daily  . simvastatin  40 mg Oral QPM  . sodium chloride flush  10-40 mL Intracatheter Q12H  . thiamine  100 mg Oral Daily   Continuous Infusions: PRN Meds: sodium chloride flush  Time spent: 35 minutes  Author: Berle Mull, MD [facility] Pager: [phone] 09/14/2016 5:58 PM  If 7PM-7AM, please contact night-coverage at [email or web address], password [facility]

## 2016-09-15 DIAGNOSIS — G9340 Encephalopathy, unspecified: Secondary | ICD-10-CM | POA: Diagnosis not present

## 2016-09-15 DIAGNOSIS — E876 Hypokalemia: Secondary | ICD-10-CM

## 2016-09-15 DIAGNOSIS — G92 Toxic encephalopathy: Secondary | ICD-10-CM | POA: Diagnosis not present

## 2016-09-15 DIAGNOSIS — E119 Type 2 diabetes mellitus without complications: Secondary | ICD-10-CM | POA: Diagnosis not present

## 2016-09-15 DIAGNOSIS — I10 Essential (primary) hypertension: Secondary | ICD-10-CM | POA: Diagnosis not present

## 2016-09-15 LAB — GLUCOSE, CAPILLARY
GLUCOSE-CAPILLARY: 122 mg/dL — AB (ref 65–99)
Glucose-Capillary: 122 mg/dL — ABNORMAL HIGH (ref 65–99)
Glucose-Capillary: 169 mg/dL — ABNORMAL HIGH (ref 65–99)
Glucose-Capillary: 109 mg/dL — ABNORMAL HIGH (ref 65–99)

## 2016-09-15 MED ORDER — HYDRALAZINE HCL 20 MG/ML IJ SOLN
10.0000 mg | INTRAMUSCULAR | Status: DC | PRN
  Filled 2016-09-15: qty 1

## 2016-09-15 MED ORDER — POTASSIUM CHLORIDE CRYS ER 20 MEQ PO TBCR
40.0000 meq | EXTENDED_RELEASE_TABLET | Freq: Once | ORAL | Status: AC
  Administered 2016-09-15: 40 meq via ORAL
  Filled 2016-09-15: qty 2

## 2016-09-15 NOTE — Progress Notes (Signed)
PROGRESS NOTE   Jeanette [name]  MOQ:[id number]    DOB: [birth year]    DOA: 09/13/2016  PCP: [facility] have briefly reviewed patients previous [facility] in [facility].  Brief Narrative:  76 year old female with PMH of DM 2, HTN, lumbar laminectomy complicated by seroma and infected surgical site that required prolonged IV antibiotics and completed IV cefepime on 09/13/16, presented with confusion and diagnosed with acute encephalopathy, suspected from cefepime toxicity. Improving.   Assessment & Plan:   Principal Problem:   Encephalopathy Active Problems:   DM2 (diabetes mellitus, type 2) (HCC)   HTN (hypertension)   Abscess of lower back   Fat necrosis of skin   1. Acute encephalopathy: Neurology follow-up appreciated. MRI and EEG negative. B12 low normal at 256. TSH 3.121 but free T4 mildly elevated at 1.28. Clinically euthyroid. Ammonia 25. UA not suggestive of UTI. Per neurology, encephalopathy secondary to cefepime toxicity, cefepime has been discontinued and patient is slowly improving but this will take time. Keppra was discontinued and neurology signed off 5/30. Continue to monitor. 2. Abscess at lumbar laminectomy surgical site: Completed 38 days of IV cefepime and antibiotics were discontinued. No further indication for antibiotics. 3. Essential hypertension: Controlled. Continue HCTZ, hydralazine and lisinopril. 4. Type II DM: SSI. Reasonable inpatient control. 5. Hypokalemia: Secondary to HCTZ. Aggressively replace and follow. Check magnesium. 6. Anemia: Follow CBCs in a.m.   DVT prophylaxis: Subcutaneous heparin Code Status: Full Family Communication: None at bedside Disposition: DC pending further medical improvement and based on PT evaluation.   Consultants:  Neurology-signed off 5/30   Procedures:  None  Antimicrobials:  Discontinued    Subjective: Seen this morning. States that she feels "fine". He indicates that her confusion  seems to be improving but not fully resolved. Denied any other complaints.   ROS: Denied pain.  Objective:  Vitals:   09/15/16 0600 09/15/16 1229 09/15/16 1245 09/15/16 1424  BP: (!) 153/59   (!) 132/43  Pulse: 60   75  Resp:    20  Temp:    98 F (36.7 C)  TempSrc:    Oral  SpO2:  96% 96% 92%  Weight:      Height:        Examination:  General exam: Pleasant elderly female lying comfortably in bed Respiratory system: Clear to auscultation. Respiratory effort normal. Cardiovascular system: S1 & S2 heard, RRR. No JVD, murmurs, rubs, gallops or clicks. No pedal edema. Gastrointestinal system: Abdomen is nondistended, soft and nontender. No organomegaly or masses felt. Normal bowel sounds heard. Central nervous system: Alert and oriented to person and place. No focal neurological deficits. Extremities: Symmetric 5 x 5 power. Skin: No rashes, lesions or ulcers Psychiatry: Judgement and insight appear impaired. Mood & affect flat.     Data Reviewed: I have personally reviewed following labs and imaging studies  CBC:  Recent Labs Lab 09/14/16 0338  WBC 5.6  HGB 11.2*  HCT 35.5*  MCV 86.2  PLT 354   Basic Metabolic Panel:  Recent Labs Lab 09/14/16 0338  NA 139  K 3.1*  CL 97*  CO2 32  GLUCOSE 97  BUN 6  CREATININE 0.63  CALCIUM 9.0   Liver Function Tests:  Recent Labs Lab 09/14/16 0338  AST 16  ALT 11*  ALKPHOS 43  BILITOT 0.7  PROT 6.3*  ALBUMIN 2.9*   CBG:  Recent Labs Lab 09/14/16 1138 09/14/16 1706 09/14/16 2209 09/15/16 0737 09/15/16 1218  GLUCAP 144*  153* 130* 122* 122*    Recent Results (from the past 240 hour(s))  MRSA PCR Screening     Status: None   Collection Time: 09/13/16 11:05 PM  Result Value Ref Range Status   MRSA by PCR NEGATIVE NEGATIVE Final    Comment:        The GeneXpert MRSA Assay (FDA approved for NASAL specimens only), is one component of a comprehensive MRSA colonization surveillance program. It is  not intended to diagnose MRSA infection nor to guide or monitor treatment for MRSA infections.          Radiology Studies: Mr Jeri Cos ZT Contrast  Result Date: 09/14/2016 CLINICAL DATA:  Encephalopathy. Recent treatment for spinal surgical site infection EXAM: MRI HEAD WITHOUT AND WITH CONTRAST TECHNIQUE: Multiplanar, multiecho pulse sequences of the brain and surrounding structures were obtained without and with intravenous contrast. CONTRAST:  62mL MULTIHANCE GADOBENATE DIMEGLUMINE 529 MG/ML IV SOLN COMPARISON:  08/23/2016 FINDINGS: Brain: No acute infarction, hemorrhage, hydrocephalus, extra-axial collection or mass lesion. Prominent FLAIR hyperintensity in the pons brainstem and bilateral thalamus. No restricted diffusion or enhancement to suggest acute demyelination. This may reflect advanced chronic small vessel ischemia. More mild ot moderate chronic microvascular ischemic type change seen in the cerebral white matter. Two small remote right cerebellar infarcts. No abnormal intracranial enhancement. Vascular: Major flow voids are preserved. Skull and upper cervical spine: No evidence of marrow lesion. Retro dental ligamentous thickening Sinuses/Orbits: Right sphenoid sinusitis with mucosal thickening and fluid level, interval compared to head CT 5 days prior. Other: Intermittently motion degraded, likely best obtainable in this altered patient. IMPRESSION: 1. No acute finding or change from 08/23/2016. 2. Unchanged prominent signal abnormality in the pons and thalami which could be sequela of chronic small vessel ischemia or osmotic demyelination. 3. Small-vessel type changes in the cerebral white matter are more mild to moderate. Electronically Signed   By: Monte Fantasia M.D.   On: 09/14/2016 15:54        Scheduled Meds: . diclofenac  75 mg Oral BID WC  . enoxaparin (LOVENOX) injection  40 mg Subcutaneous Q24H  . FLUoxetine  20 mg Oral Daily  . folic acid  1 mg Oral Daily  .  hydrALAZINE  100 mg Oral BID  . hydrochlorothiazide  25 mg Oral Daily  . insulin aspart  0-15 Units Subcutaneous TID WC  . lisinopril  40 mg Oral Daily  . simvastatin  40 mg Oral QPM  . sodium chloride flush  10-40 mL Intracatheter Q12H  . thiamine  100 mg Oral Daily   Continuous Infusions:   LOS: 1 day     Keyonni Percival, MD, FACP, FHM. [facility] Pager [phone]  If 7PM-7AM, please contact night-coverage [email or web address] Password [email or web address] 09/15/2016, 4:07 PM

## 2016-09-15 NOTE — Progress Notes (Signed)
SATURATION QUALIFICATIONS: (This note is used to comply with regulatory documentation for home oxygen)  Patient Saturations on Room Air at Rest = 97%  Patient Saturations on Room Air while Ambulating = 94%  Patient Saturations on 2 Liters of oxygen while Ambulating = 98%  Please briefly explain why patient needs home oxygen:

## 2016-09-15 NOTE — Progress Notes (Signed)
Pt reports having been able to perform self care and worked with her husband on IADL at her baseline. Pt reports word finding difficulties prior to admission. She presents with impaired cognition and poor balance requiring min guard to min assist for ADL and mobility. Recommending home with [place]. Will follow acutely.   09/15/16 1547  OT Visit Information  Last OT Received On 09/15/16  Assistance Needed +1  History of Present Illness Pt, admitted from [facility] where she was receiving rehab, is a 76 y/o female with PMH significant for type II DM, HTN, lumbar laminectomy 8 2017 followed by seroma, followed by infection at the surgical site requiring prolonged antibiotics with IV cefepime finished on 09/13/2016; admitted on 09/13/2016, presented with complaint of confusion, was found to have acute encephalopathy.  Precautions  Precautions Fall  Pain Assessment  Pain Assessment No/denies pain  Cognition  Arousal/Alertness Awake/alert  Behavior During Therapy Flat affect  Overall Cognitive Status No family/caregiver present to determine baseline cognitive functioning  Area of Impairment Memory;Problem solving  Memory Decreased short-term memory  Problem Solving Slow processing  General Comments slow verbal responses to questions  ADL  Overall ADL's  Needs assistance/impaired  Eating/Feeding Independent;Sitting  Grooming Wash/dry hands;Wash/dry face;Brushing hair;Oral care;Sitting;Standing;Supervision/safety  Upper Body Bathing Supervision/ safety;Sitting  Lower Body Bathing Min guard;Sit to/from stand  Upper Body Dressing  Set up;Sitting  Lower Body Dressing Min guard;Sit to/from Retail buyer Minimal assistance;Ambulation;Comfort height toilet  Toileting- Water quality scientist and Hygiene Min guard;Sit to/from stand  Functional mobility during ADLs Minimal assistance (hand held assist or used furniture)  General ADL Comments Per SW note, husband wants to take pt  home with 24 hour supervision.  Bed Mobility  General bed mobility comments pt in chair  Balance  Overall balance assessment Needs assistance  Sitting balance-Leahy Scale Good  Standing balance-Leahy Scale Poor  Standing balance comment reaches for surfaces as she walks, hand held assist in open spaces  Restrictions  Weight Bearing Restrictions No  Transfers  Overall transfer level Needs assistance  Equipment used 1 person hand held assist  Transfers Sit to/from Stand  Sit to Stand Min guard  General transfer comment no physical assist, min guard for safety  OT - End of Session  Equipment Utilized During Treatment Gait belt  Activity Tolerance Patient tolerated treatment well  Patient left in chair;with call bell/phone within reach;with chair alarm set  OT Assessment/Plan  OT Visit Diagnosis Unsteadiness on feet (R26.81);Cognitive communication deficit (R41.841)  OT Frequency (ACUTE ONLY) Min 2X/week  Follow Up Recommendations Home health OT;Supervision/Assistance - 24 hour  AM-PAC OT "6 Clicks" Daily Activity Outcome Measure  Help from another person eating meals? 4  Help from another person taking care of personal grooming? 3  Help from another person toileting, which includes using toliet, bedpan, or urinal? 3  Help from another person bathing (including washing, rinsing, drying)? 3  Help from another person to put on and taking off regular upper body clothing? 4  Help from another person to put on and taking off regular lower body clothing? 3  6 Click Score 20  ADL G Code Conversion CJ  Acute Rehab OT Goals  Patient Stated Goal to go home  OT Goal Formulation With patient  Time For Goal Achievement 09/22/16  Potential to Achieve Goals Good  OT Time Calculation  OT Start Time (ACUTE ONLY) 1506  OT Stop Time (ACUTE ONLY) 1525  OT Time Calculation (min) 19 min  OT G-codes **NOT  FOR INPATIENT CLASS**  Functional Assessment Tool Used Clinical judgement  Functional  Limitation Self care  Self Care Current Status [phone]) CJ  Self Care Goal Status (A4497) CI  OT General Charges  $OT Visit 1 Procedure  OT Evaluation  $OT Eval Moderate Complexity 1 Procedure  09/15/2016 Nestor Lewandowsky, OTR/L Pager: [phone]

## 2016-09-15 NOTE — Progress Notes (Signed)
Subjective: Slowly improving. Her husband she is more alert and slightly better with mentation.  Exam: Vitals:   09/15/16 0447 09/15/16 0600  BP: (!) 171/60 (!) 153/59  Pulse: 82 60  Resp: 16   Temp: 99.2 F (37.3 C)     HEENT-  Normocephalic, no lesions, without obvious abnormality.  Normal external eye and conjunctiva.  Normal TM's bilaterally.  Normal auditory canals and external ears. Normal external nose, mucus membranes and septum.  Normal pharynx.   Neuro: -Patient is alert to hospital believes that she is in [place], able to tell me the month is May and follow three-step commands today which is improved since yesterday CN: Pupils are equal and round. They are symmetrically reactive from 3-->2 mm. EOMI without nystagmus. Facial sensation is intact to light touch. Face is symmetric at rest with normal strength and mobility. Hearing is intact to conversational voice. Palate elevates symmetrically and uvula is midline. Voice is normal in tone, pitch and quality. Bilateral SCM and trapezii are 5/5. Tongue is midline with normal bulk and mobility.  Motor: Normal bulk, tone, and strength. 5/5 throughout. No drift.  Sensation: Intact to light touch.  DTRs: 2+, symmetric  Toes downgoing bilaterally. No pathologic reflexes.  Coordination: Finger-to-nose and heel-to-shin are without dysmetria     Pertinent Labs/Diagnostics: MRI and EEG were normal  Etta Quill PA-C [facility] [phone]  Impression: Encephalopathy secondary to cefepime toxicity. Cefepime has been DC'd and patient is slowly improving. This will take time however there is definite improvement on a daily basis. We will discontinue Keppra at this time also. At this time no further recommendations per neurology and neurology will sign off      09/15/2016, 9:24 AM

## 2016-09-15 NOTE — Progress Notes (Signed)
Patient's BP elevated at 171/60 this morning. Kakrakandy,MD notified. Kakrakandy,MD returned page and gave verbal order for RN to place order for PRN q4 hours 10mg  of hydralazine for patient. RN placed order in EPIC. Will give to patient per orders. Will continue to monitor and treat per MD orders.

## 2016-09-15 NOTE — Evaluation (Signed)
Physical Therapy Evaluation Patient Details Name: Jeanette [name] MRN: [id number] DOB: 22-Nov-1940 Today's Date: 09/15/2016   History of Present Illness  Pt is a 76 y/o female with PMH significant for type II DM, HTN, lumbar laminectomy 8 2017 followed by seroma, followed by infection at the surgical site requiring prolonged antibiotics with IV cefepime finished on 09/13/2016; admitted on 09/13/2016, presented with complaint of confusion, was found to have acute encephalopathy.  Clinical Impression  Pt admitted with/for AMS from antibiotic toxicity.  Pt currently limited functionally due to the problems listed below.  (see problems list.)  Pt will benefit from PT to maximize function and safety to be able to get home safely with available assist of family.     Follow Up Recommendations Home health PT    Equipment Recommendations       Recommendations for Other Services       Precautions / Restrictions Precautions Precautions: Fall Restrictions Weight Bearing Restrictions: No      Mobility  Bed Mobility Overal bed mobility: Needs Assistance Bed Mobility: Supine to Sit     Supine to sit: Supervision     General bed mobility comments: pt in chair (Simultaneous filing. User may not have seen previous data.)  Transfers Overall transfer level: Needs assistance Equipment used: 1 person hand held assist Transfers: Sit to/from Stand Sit to Stand: Min guard         General transfer comment: no physical assist, min guard for safety  Ambulation/Gait Ambulation/Gait assistance: Min guard Ambulation Distance (Feet): 90 Feet Assistive device: None Gait Pattern/deviations: Step-through pattern   Gait velocity interpretation: Below normal speed for age/gender General Gait Details: mildly unsteady overall, reach consistently for stationary furniture, the rail..  Some drifting or mild stagger, but no overt LOB.  Stairs            Wheelchair Mobility    Modified Rankin  (Stroke Patients Only)       Balance Overall balance assessment: Needs assistance   Sitting balance-Leahy Scale: Good       Standing balance-Leahy Scale: Poor (Simultaneous filing. User may not have seen previous data.) Standing balance comment: reaches for surfaces as she walks, hand held assist in open spaces                             Pertinent Vitals/Pain Pain Assessment: No/denies pain    Home Living Family/patient expects to be discharged to:: Private residence Living Arrangements: Spouse/significant other Available Help at Discharge: Family Type of Home: Mobile home Home Access: Level entry     Home Layout: One level Home Equipment: Environmental consultant - 2 wheels      Prior Function Level of Independence: Needs assistance   Gait / Transfers Assistance Needed: walks sometimes with a walker, likely furniture walks at home  ADL's / Homemaking Assistance Needed: husband assists with medication, pt and husband work together on meal prep and housekeeping, pt is independent in ADL and stands to shower, does not drive anymore  Comments: Pt considers herself a "home body"     Hand Dominance   Dominant Hand: Right    Extremity/Trunk Assessment   Upper Extremity Assessment Upper Extremity Assessment: Overall WFL for tasks assessed    Lower Extremity Assessment Lower Extremity Assessment: Overall WFL for tasks assessed (bil mild proximal weakness)       Communication   Communication: Expressive difficulties;HOH (impaired word finding)  Cognition Arousal/Alertness: Awake/alert Behavior During Therapy: Flat affect  Overall Cognitive Status: No family/caregiver present to determine baseline cognitive functioning Area of Impairment: Memory;Problem solving                     Memory: Decreased short-term memory       Problem Solving: Slow processing General Comments: slow verbal responses to questions      General Comments      Exercises      Assessment/Plan    PT Assessment Patient needs continued PT services  PT Problem List Decreased strength;Decreased activity tolerance;Decreased balance;Decreased mobility       PT Treatment Interventions Gait training;Functional mobility training;Therapeutic activities;Balance training;Patient/family education    PT Goals (Current goals can be found in the Care Plan section)  Acute Rehab PT Goals Patient Stated Goal: to go home PT Goal Formulation: With patient Time For Goal Achievement: 09/22/16 Potential to Achieve Goals: Good    Frequency Min 3X/week   Barriers to discharge        Co-evaluation               AM-PAC PT "6 Clicks" Daily Activity  Outcome Measure Difficulty turning over in bed (including adjusting bedclothes, sheets and blankets)?: A Little Difficulty moving from lying on back to sitting on the side of the bed? : A Little Difficulty sitting down on and standing up from a chair with arms (e.g., wheelchair, bedside commode, etc,.)?: A Little Help needed moving to and from a bed to chair (including a wheelchair)?: A Little Help needed walking in hospital room?: A Little Help needed climbing 3-5 steps with a railing? : A Little 6 Click Score: 18    End of Session   Activity Tolerance: Patient tolerated treatment well Patient left: in chair;with call bell/phone within reach;with chair alarm set Nurse Communication: Mobility status PT Visit Diagnosis: Unsteadiness on feet (R26.81)    Time: 1449-1510 PT Time Calculation (min) (ACUTE ONLY): 21 min   Charges:   PT Evaluation $PT Eval Moderate Complexity: 1 Procedure     PT G Codes:   PT G-Codes **NOT FOR INPATIENT CLASS** Functional Assessment Tool Used: AM-PAC 6 Clicks Basic Mobility;Clinical judgement Functional Limitation: Mobility: Walking and moving around Mobility: Walking and Moving Around Current Status (G6659): At least 1 percent but less than 20 percent impaired, limited or  restricted Mobility: Walking and Moving Around Goal Status [phone]): At least 1 percent but less than 20 percent impaired, limited or restricted    09/15/2016  Jeanette [name], PT [phone]  (pager)  Jeanette [name] 09/15/2016, 4:24 PM

## 2016-09-16 DIAGNOSIS — G9340 Encephalopathy, unspecified: Secondary | ICD-10-CM | POA: Diagnosis not present

## 2016-09-16 DIAGNOSIS — E876 Hypokalemia: Secondary | ICD-10-CM | POA: Diagnosis not present

## 2016-09-16 DIAGNOSIS — G92 Toxic encephalopathy: Secondary | ICD-10-CM | POA: Diagnosis not present

## 2016-09-16 DIAGNOSIS — I10 Essential (primary) hypertension: Secondary | ICD-10-CM | POA: Diagnosis not present

## 2016-09-16 DIAGNOSIS — E119 Type 2 diabetes mellitus without complications: Secondary | ICD-10-CM | POA: Diagnosis not present

## 2016-09-16 LAB — BASIC METABOLIC PANEL
Anion gap: 8 (ref 5–15)
BUN: 12 mg/dL (ref 6–20)
CALCIUM: 9.1 mg/dL (ref 8.9–10.3)
CO2: 33 mmol/L — ABNORMAL HIGH (ref 22–32)
CREATININE: 0.73 mg/dL (ref 0.44–1.00)
Chloride: 98 mmol/L — ABNORMAL LOW (ref 101–111)
GFR calc Af Amer: >60 mL/min (ref >60)
GFR calc non Af Amer: >60 mL/min (ref >60)
Glucose, Bld: 142 mg/dL — ABNORMAL HIGH (ref 65–99)
Potassium: 3.5 mmol/L (ref 3.5–5.1)
SODIUM: 139 mmol/L (ref 135–145)

## 2016-09-16 LAB — MAGNESIUM: MAGNESIUM: 1.3 mg/dL — AB (ref 1.7–2.4)

## 2016-09-16 LAB — CBC
HCT: 36.7 % (ref 36.0–46.0)
Hemoglobin: 11.7 g/dL — ABNORMAL LOW (ref 12.0–15.0)
MCH: 27.6 pg (ref 26.0–34.0)
MCHC: 31.9 g/dL (ref 30.0–36.0)
MCV: 86.6 fL (ref 78.0–100.0)
Platelets: 183 10*3/uL (ref 150–400)
RBC: 4.24 MIL/uL (ref 3.87–5.11)
RDW: 14.2 % (ref 11.5–15.5)
WBC: 6.8 10*3/uL (ref 4.0–10.5)

## 2016-09-16 LAB — GLUCOSE, CAPILLARY
GLUCOSE-CAPILLARY: 171 mg/dL — AB (ref 65–99)
Glucose-Capillary: 123 mg/dL — ABNORMAL HIGH (ref 65–99)

## 2016-09-16 MED ORDER — MAGNESIUM SULFATE 4 GM/100ML IV SOLN
4.0000 g | Freq: Once | INTRAVENOUS | Status: AC
  Administered 2016-09-16: 4 g via INTRAVENOUS
  Filled 2016-09-16: qty 100

## 2016-09-16 MED ORDER — POTASSIUM CHLORIDE CRYS ER 20 MEQ PO TBCR
40.0000 meq | EXTENDED_RELEASE_TABLET | Freq: Once | ORAL | Status: AC
  Administered 2016-09-16: 40 meq via ORAL
  Filled 2016-09-16: qty 2

## 2016-09-16 MED ORDER — POTASSIUM CHLORIDE CRYS ER 20 MEQ PO TBCR: 20.0000 meq | EXTENDED_RELEASE_TABLET | Freq: Every day | ORAL | 0 refills | Status: DC

## 2016-09-16 MED ORDER — DICLOFENAC SODIUM 75 MG PO TBEC: 75.0000 mg | DELAYED_RELEASE_TABLET | Freq: Two times a day (BID) | ORAL | Status: DC | PRN

## 2016-09-16 NOTE — Discharge Summary (Signed)
Physician Discharge Summary  Jeanette [name] VZD:[id number] DOB: [birth year]  PCP: Townsend Roger, MD  Admit date: 09/13/2016 Discharge date: 09/16/2016  Recommendations for Outpatient Follow-up:  1. Dr. Townsend Roger, PCP in 5 days with repeat labs (CBC, BMP & magnesium). 2. Recommend repeating TSH, free T4 in 4-6 weeks. 3. Dr. Creig Hines, Othopedics: Follow-up regarding recent lumbar spine surgery that was complicated by postop wound infection and completed prolonged course of IV antibiotics.  Home Health: PT Equipment/Devices: None    Discharge Condition: Improved and stable  CODE STATUS: Full  Diet recommendation: Heart healthy and diabetic diet.  Discharge Diagnoses:  Principal Problem:   Encephalopathy Active Problems:   DM2 (diabetes mellitus, type 2) (HCC)   HTN (hypertension)   Abscess of lower back   Fat necrosis of skin   Brief Summary: 76 year old female with PMH of diet-controlled DM 2, HTN, lumbar laminectomy complicated by seroma and infected surgical site that required prolonged IV antibiotics and completed IV cefepime on 09/13/16, presented with confusion and diagnosed with acute encephalopathy, suspected from cefepime toxicity. Improving.   Assessment & Plan:   1. Acute encephalopathy: Neurology follow-up appreciated. MRI showed no acute findings or change from 08/23/16. Detailed findings of MRI as below. EEG negative for seizure like activity. B12 low normal at 256 (may consider oral B12 supplements as outpatient). TSH 3.121 but free T4 mildly elevated at 1.28. Clinically euthyroid. Ammonia 25. UA not suggestive of UTI. Per neurology, encephalopathy secondary to cefepime toxicity, cefepime was completed and discontinued. Patient is slowly improving but this will take time. Keppra was discontinued and neurology signed off 5/30. As per spouse, mental status is at least 50% better. Outpatient follow-up with PCP. 2. Abscess at lumbar laminectomy surgical site:  Completed 38 days of IV cefepime and antibiotics were discontinued. No further indication for antibiotics. Confirmed with pharmacist that patient actually completed the course of antibiotics that she was supposed to be on. RUE in the lumen PICC line that was placed prior to admission will BE removed prior to discharge. 3. Essential hypertension: Controlled. Continue HCTZ, hydralazine and lisinopril. 4. Type II DM: SSI. Reasonable inpatient control. Not on diabetic meds as outpatient. May follow-up with PCP. No A1c in system but may have one at PCP. 5. Hypokalemia: Secondary to HCTZ. Improved to 3.5 but will give additional supplementation prior to discharge and continue KDur 20 meq daily while on HCTZ. Magnesium 1.3, replace with IV magnesium sulfate 4 g 1 dose prior to discharge. Follow BMP and magnesium as outpatient next week. 6. Anemia: Stable.   Consultants:  Neurology-signed off 5/30   Procedures:  Patient had right upper extremity single-lumen PICC line prior to admission which was removed at discharge.  Discharge Instructions  Discharge Instructions    Call MD for:    Complete by:  As directed    Worsening confusion or persistent confusion.   Call MD for:  difficulty breathing, headache or visual disturbances    Complete by:  As directed    Call MD for:  extreme fatigue    Complete by:  As directed    Call MD for:  persistant dizziness or light-headedness    Complete by:  As directed    Call MD for:  persistant nausea and vomiting    Complete by:  As directed    Call MD for:  redness, tenderness, or signs of infection (pain, swelling, redness, odor or green/yellow discharge around incision site)    Complete by:  As directed  Call MD for:  severe uncontrolled pain    Complete by:  As directed    Call MD for:  temperature >100.4    Complete by:  As directed    Diet - low sodium heart healthy    Complete by:  As directed    Diet Carb Modified    Complete by:  As  directed    Increase activity slowly    Complete by:  As directed        Medication List    STOP taking these medications   nystatin powder Commonly known as:  MYCOSTATIN/NYSTOP     TAKE these medications   diclofenac 75 MG EC tablet Commonly known as:  VOLTAREN Take 1 tablet (75 mg total) by mouth 2 (two) times daily as needed for mild pain or moderate pain. What changed:  when to take this  reasons to take this   FLUoxetine 20 MG capsule Commonly known as:  PROZAC Take 20 mg by mouth daily.   hydrALAZINE 100 MG tablet Commonly known as:  APRESOLINE Take 100 mg by mouth 2 (two) times daily.   hydrochlorothiazide 25 MG tablet Commonly known as:  HYDRODIURIL Take 25 mg by mouth daily.   lisinopril 40 MG tablet Commonly known as:  PRINIVIL,ZESTRIL Take 40 mg by mouth daily.   ondansetron 8 MG tablet Commonly known as:  ZOFRAN Take 8 mg by mouth 2 (two) times daily as needed for nausea or vomiting.   pantoprazole 40 MG tablet Commonly known as:  PROTONIX Take 40 mg by mouth daily.   potassium chloride SA 20 MEQ tablet Commonly known as:  K-DUR,KLOR-CON Take 1 tablet (20 mEq total) by mouth daily. Start taking on:  09/17/2016   simvastatin 40 MG tablet Commonly known as:  ZOCOR Take 40 mg by mouth daily.      Follow-up Information    Nona Dell, Corene Cornea, MD. Schedule an appointment as soon as possible for a visit in 5 day(s).   Specialty:  Internal Medicine Why:  To be seen with repeat labs (CBC & BMP). Contact information: [address] [place] [address] [phone]        Creig Hines, MD. Schedule an appointment as soon as possible for a visit in 1 week(s).   Specialty:  Orthopedic Surgery Why:  Follow-up regarding recent surgical site infection and completed prolonged IV antibiotics. Contact information: [place] [address] [phone]          Allergies  Allergen Reactions  . Lidocaine Other (See  Comments)    Reaction not recalled  . Metoprolol Other (See Comments)    Reaction not recalled  . Oxycodone Other (See Comments)    Overly-sedates the patient  . Tramadol Itching      Procedures/Studies:  Mr Jeri Cos VO Contrast  Result Date: 09/14/2016 CLINICAL DATA:  Encephalopathy. Recent treatment for spinal surgical site infection EXAM: MRI HEAD WITHOUT AND WITH CONTRAST TECHNIQUE: Multiplanar, multiecho pulse sequences of the brain and surrounding structures were obtained without and with intravenous contrast. CONTRAST:  18mL MULTIHANCE GADOBENATE DIMEGLUMINE 529 MG/ML IV SOLN COMPARISON:  08/23/2016 FINDINGS: Brain: No acute infarction, hemorrhage, hydrocephalus, extra-axial collection or mass lesion. Prominent FLAIR hyperintensity in the pons brainstem and bilateral thalamus. No restricted diffusion or enhancement to suggest acute demyelination. This may reflect advanced chronic small vessel ischemia. More mild ot moderate chronic microvascular ischemic type change seen in the cerebral white matter. Two small remote right cerebellar  infarcts. No abnormal intracranial enhancement. Vascular: Major flow voids are preserved. Skull and upper cervical spine: No evidence of marrow lesion. Retro dental ligamentous thickening Sinuses/Orbits: Right sphenoid sinusitis with mucosal thickening and fluid level, interval compared to head CT 5 days prior. Other: Intermittently motion degraded, likely best obtainable in this altered patient. IMPRESSION: 1. No acute finding or change from 08/23/2016. 2. Unchanged prominent signal abnormality in the pons and thalami which could be sequela of chronic small vessel ischemia or osmotic demyelination. 3. Small-vessel type changes in the cerebral white matter are more mild to moderate. Electronically Signed   By: Monte Fantasia M.D.   On: 09/14/2016 15:54      Subjective: Patient states that she feels okay. She denies any complaints. She denies back pain, lower  extremity tingling, numbness or weakness. As per spouse at bedside, patient continues to gradually improve and he indicates that her confusion is at least 50% better. As per RN, no acute issues reported.  Discharge Exam:  Vitals:   09/15/16 1424 09/15/16 2212 09/16/16 0605 09/16/16 0627  BP: (!) 132/43 (!) 176/72 (!) 170/68 (!) 155/65  Pulse: 75 72 86 82  Resp: 20 18 18    Temp: 98 F (36.7 C) 98.1 F (36.7 C) 98 F (36.7 C)   TempSrc: Oral Oral Oral   SpO2: 92% 93% 96%   Weight:      Height:        General exam: Pleasant elderly female lying comfortably in bed. Does not look septic or toxic. Respiratory system: Clear to auscultation. Respiratory effort normal. Cardiovascular system: S1 & S2 heard, RRR. No JVD, murmurs, rubs, gallops or clicks. No pedal edema. Not on telemetry. Gastrointestinal system: Abdomen is nondistended, soft and nontender. No organomegaly or masses felt. Normal bowel sounds heard. Central nervous system: Alert and oriented to person, place and partly to time. No focal neurological deficits. Extremities: Symmetric 5 x 5 power. Skin: No rashes, lesions or ulcers. Midline lumbar surgical site wound healing well without overt complicating features at this time. Psychiatry: Judgement and insight appear impaired. Mood & affect flat.     The results of significant diagnostics from this hospitalization (including imaging, microbiology, ancillary and laboratory) are listed below for reference.     Microbiology: Recent Results (from the past 240 hour(s))  MRSA PCR Screening     Status: None   Collection Time: 09/13/16 11:05 PM  Result Value Ref Range Status   MRSA by PCR NEGATIVE NEGATIVE Final    Comment:        The GeneXpert MRSA Assay (FDA approved for NASAL specimens only), is one component of a comprehensive MRSA colonization surveillance program. It is not intended to diagnose MRSA infection nor to guide or monitor treatment for MRSA infections.       Labs: CBC:  Recent Labs Lab 09/14/16 0338 09/16/16 0420  WBC 5.6 6.8  HGB 11.2* 11.7*  HCT 35.5* 36.7  MCV 86.2 86.6  PLT 193 073   Basic Metabolic Panel:  Recent Labs Lab 09/14/16 0338 09/16/16 0420  NA 139 139  K 3.1* 3.5  CL 97* 98*  CO2 32 33*  GLUCOSE 97 142*  BUN 6 12  CREATININE 0.63 0.73  CALCIUM 9.0 9.1  MG  --  1.3*   Liver Function Tests:  Recent Labs Lab 09/14/16 0338  AST 16  ALT 11*  ALKPHOS 43  BILITOT 0.7  PROT 6.3*  ALBUMIN 2.9*   CBG:  Recent Labs Lab 09/15/16 1218 09/15/16  1645 09/15/16 2209 09/16/16 0751 09/16/16 1208  GLUCAP 122* 169* 109* 123* 171*   Thyroid function studies  Recent Labs  09/14/16 0338  TSH 3.121   Anemia work up  Recent Labs  09/14/16 1716  VITAMINB12 256   Urinalysis    Component Value Date/Time   COLORURINE YELLOW 09/13/2016 2210   APPEARANCEUR CLEAR 09/13/2016 2210   LABSPEC 1.011 09/13/2016 2210   PHURINE 7.0 09/13/2016 2210   GLUCOSEU NEGATIVE 09/13/2016 2210   HGBUR NEGATIVE 09/13/2016 2210   BILIRUBINUR NEGATIVE 09/13/2016 2210   KETONESUR 5 (A) 09/13/2016 2210   PROTEINUR 30 (A) 09/13/2016 2210   NITRITE NEGATIVE 09/13/2016 2210   LEUKOCYTESUR TRACE (A) 09/13/2016 2210    Discussed in detail with patient's spouse. Updated care and answered questions.  Time coordinating discharge: Over 30 minutes  SIGNED:  Vernell Leep, MD, FACP, [place]. [facility] Pager [phone]  If 7PM-7AM, please contact night-coverage [email or web address] Password [facility] 09/16/2016, 12:32 PM

## 2016-09-16 NOTE — Care Management Note (Addendum)
Case Management Note  Patient Details  Name: Jeanette [name] MRN: [id number] Date of Birth: [birth year]  Subjective/Objective:        Admitted with acute encephalopathy, hx of DM, HTN, lumbar laminectomy 8 2017 followed by seroma, followed by infection at the surgical site requiring prolonged antibiotics with IV cefepime finished on 09/13/2016. From home with husband.  Haze Justin (Spouse) Jerrye Noble (Daughter)    [phone]      PCP: Townsend Roger  Action/Plan:  Plan is to d/c to home with home health services(PT).  Expected Discharge Date:  09/16/16               Expected Discharge Plan:  [place]  In-House Referral:     Discharge planning Services  CM Consult  Post Acute Care Choice:    Choice offered to:  Patient  DME Arranged:    DME Agency:     HH Arranged:  PT, RN (active PTA) [place] Agency:  [facility]/ order,demographics, H&P, D/C summary faxed to [phone].  Status of Service:  Completed, signed off  If discussed at [place] of Stay Meetings, dates discussed:    Additional Comments:  Sharin Mons, RN 09/16/2016, 1:20 PM

## 2016-09-16 NOTE — Progress Notes (Signed)
Physical Therapy Treatment Patient Details Name: Jeanette [name] MRN: [id number] DOB: 05-Feb-1941 Today's Date: 09/16/2016    History of Present Illness Pt is a 76 y/o female with PMH significant for type II DM, HTN, lumbar laminectomy 8 2017 followed by seroma, followed by infection at the surgical site requiring prolonged antibiotics with IV cefepime finished on 09/13/2016; admitted on 09/13/2016, presented with complaint of confusion, was found to have acute encephalopathy.    PT Comments    Assisted OOB to amb a greater distance in hallway using walker for increased safety and to increase amb distance.  Believe pt will be safe to use her cane at home which she prefers.  Pt stated she still feels "tiered" and "weak" but excited to be going home today.    Follow Up Recommendations  Home health PT     Equipment Recommendations       Recommendations for Other Services       Precautions / Restrictions Precautions Precautions: Fall Restrictions Weight Bearing Restrictions: No    Mobility  Bed Mobility Overal bed mobility: Modified Independent             General bed mobility comments: increased time  Transfers Overall transfer level: Needs assistance Equipment used: 1 person hand held assist Transfers: Sit to/from Stand Sit to Stand: Supervision;Min guard         General transfer comment: no physical assist, min guard for safety  Ambulation/Gait Ambulation/Gait assistance: Supervision Ambulation Distance (Feet): 125 Feet Assistive device: Rolling walker (2 wheeled) Gait Pattern/deviations: Step-through pattern;Decreased stride length Gait velocity: decreased   General Gait Details: used walker for increased safety and to increase amb distance.  Believe pt will use her cane when at home which she prefers.     Stairs            Wheelchair Mobility    Modified Rankin (Stroke Patients Only)       Balance                                            Cognition Arousal/Alertness: Awake/alert Behavior During Therapy: WFL for tasks assessed/performed Overall Cognitive Status: Within Functional Limits for tasks assessed                                 General Comments: feels tired      Exercises      General Comments        Pertinent Vitals/Pain Pain Assessment: No/denies pain    Home Living                      Prior Function            PT Goals (current goals can now be found in the care plan section) Progress towards PT goals: Progressing toward goals    Frequency    Min 3X/week      PT Plan Current plan remains appropriate    Co-evaluation              AM-PAC PT "6 Clicks" Daily Activity  Outcome Measure  Difficulty turning over in bed (including adjusting bedclothes, sheets and blankets)?: A Little Difficulty moving from lying on back to sitting on the side of the bed? : A Little Difficulty sitting down on and standing up from  a chair with arms (e.g., wheelchair, bedside commode, etc,.)?: A Little Help needed moving to and from a bed to chair (including a wheelchair)?: A Little Help needed walking in hospital room?: A Little Help needed climbing 3-5 steps with a railing? : A Little 6 Click Score: 18    End of Session Equipment Utilized During Treatment: Gait belt Activity Tolerance: Patient tolerated treatment well Patient left: in bed;with call bell/phone within reach;with family/visitor present   PT Visit Diagnosis: Unsteadiness on feet (R26.81)     Time: 5625-6389 PT Time Calculation (min) (ACUTE ONLY): 14 min  Charges:  $Gait Training: 8-22 mins                    G Codes:       {Ashima Shrake  PTA [facility]  Acute  Rehab Pager      [phone]

## 2016-09-16 NOTE — Consult Note (Signed)
   [facility] CM Inpatient Consult   09/16/2016  Jeanette [name] Aug 22, 1940 [id number]  Follow up: Patient was admitted with Acute Encephalopathy with infection.  Patient to discharge home with home health per inpatient RNCM with [facility]. Met with the patient and her husband regarding [facility] Care management, if needs arise to call. A,brochure and 24 hour nurse advise line magnet provider and encouraged to call for any post hospital community needs. Made inpatient RNCM aware of seeing this patient.  For question, please contact:  [facility], RN BSN [facility] Liaison  [phone] business mobile phone Toll free office [phone]

## 2016-09-16 NOTE — Progress Notes (Signed)
Occupational Therapy Treatment Patient Details Name: Jeanette [name] MRN: [id number] DOB: Apr 19, 1941 Today's Date: 09/16/2016    History of present illness Pt is a 76 y/o female with PMH significant for type II DM, HTN, lumbar laminectomy 8 2017 followed by seroma, followed by infection at the surgical site requiring prolonged antibiotics with IV cefepime finished on 09/13/2016; admitted on 09/13/2016, presented with complaint of confusion, was found to have acute encephalopathy.   OT comments  Educated husband and pt in use of 3 in 1 they already own in the shower and benefits of a hand held shower head. Husband agreeable to supervising shower transfer. Pt overall requiring min guard to supervision for standing ADL and ADL transfers. Husband appears to be aware of pt's level of care required. Pt eager to go home later today.  Follow Up Recommendations  Home health OT;Supervision/Assistance - 24 hour    Equipment Recommendations  None recommended by OT    Recommendations for Other Services      Precautions / Restrictions Precautions Precautions: Fall Restrictions Weight Bearing Restrictions: No       Mobility Bed Mobility Overal bed mobility: Modified Independent             General bed mobility comments: increased time  Transfers Overall transfer level: Needs assistance Equipment used: 1 person hand held assist Transfers: Sit to/from Stand Sit to Stand: Supervision         General transfer comment: supervision for safety    Balance     Sitting balance-Leahy Scale: Good       Standing balance-Leahy Scale: Fair Standing balance comment: reaches for surfaces as she walks, hand held assist in open spaces                           ADL either performed or assessed with clinical judgement   ADL Overall ADL's : Needs assistance/impaired     Grooming: Wash/dry hands;Standing;Supervision/safety           Upper Body Dressing : Set up;Sitting       Toilet Transfer: Min guard;RW;Ambulation;BSC   Toileting- Clothing Manipulation and Hygiene: Supervision/safety;Sit to/from Nurse, children's Details (indicate cue type and reason): educated pt and husband in use of 3 in 1 as shower seat and to elevate toilet Functional mobility during ADLs: Min guard General ADL Comments: Husband appearing upset by delayed discharge, but receptive of education.     Vision       Perception     Praxis      Cognition Arousal/Alertness: Awake/alert Behavior During Therapy: WFL for tasks assessed/performed Overall Cognitive Status: Impaired/Different from baseline Area of Impairment: Problem solving                             Problem Solving: Slow processing General Comments: much improved         Exercises     Shoulder Instructions       General Comments      Pertinent Vitals/ Pain       Pain Assessment: No/denies pain  Home Living                                          Prior Functioning/Environment  Frequency  Min 2X/week        Progress Toward Goals  OT Goals(current goals can now be found in the care plan section)  Progress towards OT goals: Progressing toward goals  Acute Rehab OT Goals Patient Stated Goal: to go home OT Goal Formulation: With patient Time For Goal Achievement: 09/22/16 Potential to Achieve Goals: Good  Plan Discharge plan remains appropriate    Co-evaluation                 AM-PAC PT "6 Clicks" Daily Activity     Outcome Measure   Help from another person eating meals?: None Help from another person taking care of personal grooming?: A Little Help from another person toileting, which includes using toliet, bedpan, or urinal?: A Little Help from another person bathing (including washing, rinsing, drying)?: A Little Help from another person to put on and taking off regular upper body clothing?: None Help from another  person to put on and taking off regular lower body clothing?: A Little 6 Click Score: 20    End of Session Equipment Utilized During Treatment: Gait belt  OT Visit Diagnosis: Unsteadiness on feet (R26.81);Cognitive communication deficit (R41.841)   Activity Tolerance Patient tolerated treatment well   Patient Left in bed;with call bell/phone within reach;with family/visitor present   Nurse Communication          Time: 4765-4650 OT Time Calculation (min): 20 min  Charges: OT General Charges $OT Visit: 1 Procedure OT Treatments $Self Care/Home Management : 8-22 mins    Jeanette [name] 09/16/2016, 3:42 PM  [id number]

## 2016-09-16 NOTE — Progress Notes (Signed)
Pt given discharge instructions, prescriptions, and care notes. Pt verbalized understanding AEB no further questions or concerns at this time. IV was discontinued, no redness, pain, or swelling noted at this time. Pt to leave the floor via wheelchair with staff in stable condition. 

## 2016-09-22 DIAGNOSIS — G9341 Metabolic encephalopathy: Secondary | ICD-10-CM | POA: Diagnosis not present

## 2016-11-05 DIAGNOSIS — I10 Essential (primary) hypertension: Secondary | ICD-10-CM | POA: Diagnosis not present

## 2016-11-05 DIAGNOSIS — E538 Deficiency of other specified B group vitamins: Secondary | ICD-10-CM | POA: Diagnosis not present

## 2016-11-05 DIAGNOSIS — E119 Type 2 diabetes mellitus without complications: Secondary | ICD-10-CM | POA: Diagnosis not present

## 2016-11-05 DIAGNOSIS — R946 Abnormal results of thyroid function studies: Secondary | ICD-10-CM | POA: Diagnosis not present

## 2016-11-09 DIAGNOSIS — M869 Osteomyelitis, unspecified: Secondary | ICD-10-CM | POA: Diagnosis not present

## 2016-11-09 DIAGNOSIS — Z9889 Other specified postprocedural states: Secondary | ICD-10-CM | POA: Diagnosis not present

## 2016-11-09 DIAGNOSIS — M48062 Spinal stenosis, lumbar region with neurogenic claudication: Secondary | ICD-10-CM | POA: Diagnosis not present

## 2017-01-13 DIAGNOSIS — E1140 Type 2 diabetes mellitus with diabetic neuropathy, unspecified: Secondary | ICD-10-CM | POA: Diagnosis not present

## 2017-01-13 DIAGNOSIS — I10 Essential (primary) hypertension: Secondary | ICD-10-CM | POA: Diagnosis not present

## 2017-01-13 DIAGNOSIS — M25561 Pain in right knee: Secondary | ICD-10-CM | POA: Diagnosis not present

## 2017-01-13 DIAGNOSIS — Z23 Encounter for immunization: Secondary | ICD-10-CM | POA: Diagnosis not present

## 2017-02-07 DIAGNOSIS — G894 Chronic pain syndrome: Secondary | ICD-10-CM | POA: Diagnosis not present

## 2017-02-07 DIAGNOSIS — M5416 Radiculopathy, lumbar region: Secondary | ICD-10-CM | POA: Diagnosis not present

## 2017-02-22 DIAGNOSIS — M545 Low back pain: Secondary | ICD-10-CM | POA: Diagnosis not present

## 2017-02-25 DIAGNOSIS — M545 Low back pain: Secondary | ICD-10-CM | POA: Diagnosis not present

## 2017-02-25 DIAGNOSIS — M5124 Other intervertebral disc displacement, thoracic region: Secondary | ICD-10-CM | POA: Diagnosis not present

## 2017-02-25 DIAGNOSIS — M4854XA Collapsed vertebra, not elsewhere classified, thoracic region, initial encounter for fracture: Secondary | ICD-10-CM | POA: Diagnosis not present

## 2017-02-25 DIAGNOSIS — M47814 Spondylosis without myelopathy or radiculopathy, thoracic region: Secondary | ICD-10-CM | POA: Diagnosis not present

## 2017-02-25 DIAGNOSIS — M5126 Other intervertebral disc displacement, lumbar region: Secondary | ICD-10-CM | POA: Diagnosis not present

## 2017-03-01 DIAGNOSIS — M5136 Other intervertebral disc degeneration, lumbar region: Secondary | ICD-10-CM

## 2017-03-01 DIAGNOSIS — M25559 Pain in unspecified hip: Secondary | ICD-10-CM

## 2017-03-01 DIAGNOSIS — M48061 Spinal stenosis, lumbar region without neurogenic claudication: Secondary | ICD-10-CM | POA: Diagnosis not present

## 2017-03-01 DIAGNOSIS — M4726 Other spondylosis with radiculopathy, lumbar region: Secondary | ICD-10-CM | POA: Diagnosis not present

## 2017-03-01 DIAGNOSIS — M25552 Pain in left hip: Secondary | ICD-10-CM | POA: Diagnosis not present

## 2017-03-01 DIAGNOSIS — M51369 Other intervertebral disc degeneration, lumbar region without mention of lumbar back pain or lower extremity pain: Secondary | ICD-10-CM

## 2017-03-01 DIAGNOSIS — M5416 Radiculopathy, lumbar region: Secondary | ICD-10-CM | POA: Insufficient documentation

## 2017-03-01 HISTORY — DX: Radiculopathy, lumbar region: M54.16

## 2017-03-01 HISTORY — DX: Other intervertebral disc degeneration, lumbar region: M51.36

## 2017-03-01 HISTORY — DX: Other intervertebral disc degeneration, lumbar region without mention of lumbar back pain or lower extremity pain: M51.369

## 2017-03-01 HISTORY — DX: Pain in unspecified hip: M25.559

## 2017-03-11 DIAGNOSIS — B999 Unspecified infectious disease: Secondary | ICD-10-CM | POA: Diagnosis not present

## 2017-03-24 DIAGNOSIS — G894 Chronic pain syndrome: Secondary | ICD-10-CM | POA: Diagnosis not present

## 2017-04-14 DIAGNOSIS — M25559 Pain in unspecified hip: Secondary | ICD-10-CM | POA: Diagnosis not present

## 2017-04-22 DIAGNOSIS — E7800 Pure hypercholesterolemia, unspecified: Secondary | ICD-10-CM | POA: Diagnosis not present

## 2017-04-22 DIAGNOSIS — I10 Essential (primary) hypertension: Secondary | ICD-10-CM | POA: Diagnosis not present

## 2017-04-22 DIAGNOSIS — Z0000 Encounter for general adult medical examination without abnormal findings: Secondary | ICD-10-CM | POA: Diagnosis not present

## 2017-04-22 DIAGNOSIS — E559 Vitamin D deficiency, unspecified: Secondary | ICD-10-CM | POA: Diagnosis not present

## 2017-04-22 DIAGNOSIS — E119 Type 2 diabetes mellitus without complications: Secondary | ICD-10-CM | POA: Diagnosis not present

## 2017-04-22 DIAGNOSIS — Z1389 Encounter for screening for other disorder: Secondary | ICD-10-CM | POA: Diagnosis not present

## 2017-04-25 DIAGNOSIS — M47816 Spondylosis without myelopathy or radiculopathy, lumbar region: Secondary | ICD-10-CM | POA: Diagnosis not present

## 2017-04-25 DIAGNOSIS — M5416 Radiculopathy, lumbar region: Secondary | ICD-10-CM | POA: Diagnosis not present

## 2017-04-25 DIAGNOSIS — M5136 Other intervertebral disc degeneration, lumbar region: Secondary | ICD-10-CM | POA: Diagnosis not present

## 2017-05-06 DIAGNOSIS — M25559 Pain in unspecified hip: Secondary | ICD-10-CM | POA: Diagnosis not present

## 2017-05-31 DIAGNOSIS — M5416 Radiculopathy, lumbar region: Secondary | ICD-10-CM | POA: Diagnosis not present

## 2017-05-31 DIAGNOSIS — M47816 Spondylosis without myelopathy or radiculopathy, lumbar region: Secondary | ICD-10-CM | POA: Diagnosis not present

## 2017-05-31 DIAGNOSIS — M5136 Other intervertebral disc degeneration, lumbar region: Secondary | ICD-10-CM | POA: Diagnosis not present

## 2017-06-27 DIAGNOSIS — M5416 Radiculopathy, lumbar region: Secondary | ICD-10-CM | POA: Diagnosis not present

## 2017-06-27 DIAGNOSIS — M5136 Other intervertebral disc degeneration, lumbar region: Secondary | ICD-10-CM | POA: Diagnosis not present

## 2017-06-27 DIAGNOSIS — M47816 Spondylosis without myelopathy or radiculopathy, lumbar region: Secondary | ICD-10-CM | POA: Diagnosis not present

## 2017-06-27 DIAGNOSIS — M48061 Spinal stenosis, lumbar region without neurogenic claudication: Secondary | ICD-10-CM | POA: Diagnosis not present

## 2017-07-19 DIAGNOSIS — M48061 Spinal stenosis, lumbar region without neurogenic claudication: Secondary | ICD-10-CM | POA: Diagnosis not present

## 2017-07-19 DIAGNOSIS — M5136 Other intervertebral disc degeneration, lumbar region: Secondary | ICD-10-CM | POA: Diagnosis not present

## 2017-07-19 DIAGNOSIS — M5416 Radiculopathy, lumbar region: Secondary | ICD-10-CM | POA: Diagnosis not present

## 2017-07-19 DIAGNOSIS — M47816 Spondylosis without myelopathy or radiculopathy, lumbar region: Secondary | ICD-10-CM | POA: Diagnosis not present

## 2017-07-25 DIAGNOSIS — M545 Low back pain: Secondary | ICD-10-CM | POA: Diagnosis not present

## 2017-07-25 DIAGNOSIS — Z8619 Personal history of other infectious and parasitic diseases: Secondary | ICD-10-CM | POA: Diagnosis not present

## 2017-07-25 DIAGNOSIS — E119 Type 2 diabetes mellitus without complications: Secondary | ICD-10-CM | POA: Diagnosis not present

## 2017-07-25 DIAGNOSIS — Z9889 Other specified postprocedural states: Secondary | ICD-10-CM | POA: Diagnosis not present

## 2017-07-25 DIAGNOSIS — M48061 Spinal stenosis, lumbar region without neurogenic claudication: Secondary | ICD-10-CM | POA: Diagnosis not present

## 2017-08-08 DIAGNOSIS — M48061 Spinal stenosis, lumbar region without neurogenic claudication: Secondary | ICD-10-CM | POA: Diagnosis not present

## 2017-08-08 DIAGNOSIS — H109 Unspecified conjunctivitis: Secondary | ICD-10-CM | POA: Diagnosis not present

## 2017-08-08 DIAGNOSIS — Z8619 Personal history of other infectious and parasitic diseases: Secondary | ICD-10-CM | POA: Diagnosis not present

## 2017-08-10 DIAGNOSIS — Z8619 Personal history of other infectious and parasitic diseases: Secondary | ICD-10-CM

## 2017-08-10 HISTORY — DX: Personal history of other infectious and parasitic diseases: Z86.19

## 2017-08-24 DIAGNOSIS — I081 Rheumatic disorders of both mitral and tricuspid valves: Secondary | ICD-10-CM | POA: Diagnosis not present

## 2017-08-24 DIAGNOSIS — I447 Left bundle-branch block, unspecified: Secondary | ICD-10-CM | POA: Diagnosis not present

## 2017-08-24 DIAGNOSIS — Z8619 Personal history of other infectious and parasitic diseases: Secondary | ICD-10-CM | POA: Diagnosis not present

## 2017-08-24 DIAGNOSIS — Z01810 Encounter for preprocedural cardiovascular examination: Secondary | ICD-10-CM | POA: Diagnosis not present

## 2017-08-24 DIAGNOSIS — I10 Essential (primary) hypertension: Secondary | ICD-10-CM | POA: Diagnosis not present

## 2017-08-24 DIAGNOSIS — E119 Type 2 diabetes mellitus without complications: Secondary | ICD-10-CM | POA: Diagnosis not present

## 2017-08-24 DIAGNOSIS — M48061 Spinal stenosis, lumbar region without neurogenic claudication: Secondary | ICD-10-CM | POA: Diagnosis not present

## 2017-08-24 DIAGNOSIS — R9431 Abnormal electrocardiogram [ECG] [EKG]: Secondary | ICD-10-CM | POA: Diagnosis not present

## 2017-08-24 DIAGNOSIS — M5416 Radiculopathy, lumbar region: Secondary | ICD-10-CM | POA: Diagnosis not present

## 2017-08-29 DIAGNOSIS — R002 Palpitations: Secondary | ICD-10-CM | POA: Diagnosis not present

## 2017-08-29 DIAGNOSIS — Z452 Encounter for adjustment and management of vascular access device: Secondary | ICD-10-CM | POA: Diagnosis not present

## 2017-08-29 DIAGNOSIS — I5189 Other ill-defined heart diseases: Secondary | ICD-10-CM | POA: Diagnosis not present

## 2017-08-30 DIAGNOSIS — I447 Left bundle-branch block, unspecified: Secondary | ICD-10-CM | POA: Diagnosis not present

## 2017-08-30 DIAGNOSIS — R9431 Abnormal electrocardiogram [ECG] [EKG]: Secondary | ICD-10-CM | POA: Diagnosis not present

## 2017-09-07 DIAGNOSIS — Z8619 Personal history of other infectious and parasitic diseases: Secondary | ICD-10-CM | POA: Diagnosis not present

## 2017-09-07 DIAGNOSIS — M48062 Spinal stenosis, lumbar region with neurogenic claudication: Secondary | ICD-10-CM | POA: Diagnosis not present

## 2017-09-07 DIAGNOSIS — M5416 Radiculopathy, lumbar region: Secondary | ICD-10-CM | POA: Diagnosis not present

## 2017-09-15 DIAGNOSIS — R42 Dizziness and giddiness: Secondary | ICD-10-CM | POA: Diagnosis not present

## 2017-09-15 DIAGNOSIS — M48062 Spinal stenosis, lumbar region with neurogenic claudication: Secondary | ICD-10-CM | POA: Diagnosis not present

## 2017-09-15 DIAGNOSIS — M6281 Muscle weakness (generalized): Secondary | ICD-10-CM | POA: Diagnosis not present

## 2017-09-15 DIAGNOSIS — F329 Major depressive disorder, single episode, unspecified: Secondary | ICD-10-CM | POA: Diagnosis not present

## 2017-09-15 DIAGNOSIS — E785 Hyperlipidemia, unspecified: Secondary | ICD-10-CM | POA: Diagnosis not present

## 2017-09-15 DIAGNOSIS — F419 Anxiety disorder, unspecified: Secondary | ICD-10-CM | POA: Diagnosis not present

## 2017-09-15 DIAGNOSIS — M8580 Other specified disorders of bone density and structure, unspecified site: Secondary | ICD-10-CM | POA: Diagnosis not present

## 2017-09-15 DIAGNOSIS — Z9049 Acquired absence of other specified parts of digestive tract: Secondary | ICD-10-CM | POA: Diagnosis not present

## 2017-09-15 DIAGNOSIS — Z743 Need for continuous supervision: Secondary | ICD-10-CM | POA: Diagnosis not present

## 2017-09-15 DIAGNOSIS — Z7984 Long term (current) use of oral hypoglycemic drugs: Secondary | ICD-10-CM | POA: Diagnosis not present

## 2017-09-15 DIAGNOSIS — R279 Unspecified lack of coordination: Secondary | ICD-10-CM | POA: Diagnosis not present

## 2017-09-15 DIAGNOSIS — M4326 Fusion of spine, lumbar region: Secondary | ICD-10-CM | POA: Diagnosis not present

## 2017-09-15 DIAGNOSIS — M79606 Pain in leg, unspecified: Secondary | ICD-10-CM | POA: Diagnosis not present

## 2017-09-15 DIAGNOSIS — R262 Difficulty in walking, not elsewhere classified: Secondary | ICD-10-CM | POA: Diagnosis not present

## 2017-09-15 DIAGNOSIS — S22080D Wedge compression fracture of T11-T12 vertebra, subsequent encounter for fracture with routine healing: Secondary | ICD-10-CM | POA: Diagnosis not present

## 2017-09-15 DIAGNOSIS — I10 Essential (primary) hypertension: Secondary | ICD-10-CM | POA: Diagnosis not present

## 2017-09-15 DIAGNOSIS — G8929 Other chronic pain: Secondary | ICD-10-CM | POA: Diagnosis not present

## 2017-09-15 DIAGNOSIS — M5416 Radiculopathy, lumbar region: Secondary | ICD-10-CM | POA: Diagnosis not present

## 2017-09-15 DIAGNOSIS — M5137 Other intervertebral disc degeneration, lumbosacral region: Secondary | ICD-10-CM | POA: Diagnosis not present

## 2017-09-15 DIAGNOSIS — Z96653 Presence of artificial knee joint, bilateral: Secondary | ICD-10-CM | POA: Diagnosis not present

## 2017-09-15 DIAGNOSIS — M4726 Other spondylosis with radiculopathy, lumbar region: Secondary | ICD-10-CM | POA: Diagnosis not present

## 2017-09-15 DIAGNOSIS — M48061 Spinal stenosis, lumbar region without neurogenic claudication: Secondary | ICD-10-CM | POA: Diagnosis not present

## 2017-09-15 DIAGNOSIS — D62 Acute posthemorrhagic anemia: Secondary | ICD-10-CM | POA: Diagnosis not present

## 2017-09-15 DIAGNOSIS — Z4789 Encounter for other orthopedic aftercare: Secondary | ICD-10-CM | POA: Diagnosis not present

## 2017-09-15 DIAGNOSIS — M533 Sacrococcygeal disorders, not elsewhere classified: Secondary | ICD-10-CM | POA: Diagnosis not present

## 2017-09-15 DIAGNOSIS — Z888 Allergy status to other drugs, medicaments and biological substances status: Secondary | ICD-10-CM | POA: Diagnosis not present

## 2017-09-15 DIAGNOSIS — E119 Type 2 diabetes mellitus without complications: Secondary | ICD-10-CM | POA: Diagnosis not present

## 2017-09-15 DIAGNOSIS — Z90710 Acquired absence of both cervix and uterus: Secondary | ICD-10-CM | POA: Diagnosis not present

## 2017-09-15 DIAGNOSIS — M532X6 Spinal instabilities, lumbar region: Secondary | ICD-10-CM | POA: Diagnosis not present

## 2017-09-23 DIAGNOSIS — R279 Unspecified lack of coordination: Secondary | ICD-10-CM | POA: Diagnosis not present

## 2017-09-23 DIAGNOSIS — R410 Disorientation, unspecified: Secondary | ICD-10-CM | POA: Diagnosis not present

## 2017-09-23 DIAGNOSIS — L02818 Cutaneous abscess of other sites: Secondary | ICD-10-CM | POA: Diagnosis not present

## 2017-09-23 DIAGNOSIS — K5900 Constipation, unspecified: Secondary | ICD-10-CM | POA: Diagnosis not present

## 2017-09-23 DIAGNOSIS — R262 Difficulty in walking, not elsewhere classified: Secondary | ICD-10-CM | POA: Diagnosis not present

## 2017-09-23 DIAGNOSIS — M4326 Fusion of spine, lumbar region: Secondary | ICD-10-CM | POA: Diagnosis not present

## 2017-09-23 DIAGNOSIS — E1142 Type 2 diabetes mellitus with diabetic polyneuropathy: Secondary | ICD-10-CM | POA: Diagnosis not present

## 2017-09-23 DIAGNOSIS — M1712 Unilateral primary osteoarthritis, left knee: Secondary | ICD-10-CM | POA: Diagnosis not present

## 2017-09-23 DIAGNOSIS — Z743 Need for continuous supervision: Secondary | ICD-10-CM | POA: Diagnosis not present

## 2017-09-23 DIAGNOSIS — F329 Major depressive disorder, single episode, unspecified: Secondary | ICD-10-CM | POA: Diagnosis not present

## 2017-09-23 DIAGNOSIS — J9601 Acute respiratory failure with hypoxia: Secondary | ICD-10-CM | POA: Diagnosis not present

## 2017-09-23 DIAGNOSIS — M48062 Spinal stenosis, lumbar region with neurogenic claudication: Secondary | ICD-10-CM | POA: Diagnosis not present

## 2017-09-23 DIAGNOSIS — H109 Unspecified conjunctivitis: Secondary | ICD-10-CM | POA: Diagnosis not present

## 2017-09-23 DIAGNOSIS — D649 Anemia, unspecified: Secondary | ICD-10-CM | POA: Diagnosis not present

## 2017-09-23 DIAGNOSIS — F331 Major depressive disorder, recurrent, moderate: Secondary | ICD-10-CM | POA: Diagnosis not present

## 2017-09-23 DIAGNOSIS — Z4789 Encounter for other orthopedic aftercare: Secondary | ICD-10-CM | POA: Diagnosis not present

## 2017-09-23 DIAGNOSIS — I10 Essential (primary) hypertension: Secondary | ICD-10-CM | POA: Diagnosis not present

## 2017-09-23 DIAGNOSIS — M5416 Radiculopathy, lumbar region: Secondary | ICD-10-CM | POA: Diagnosis not present

## 2017-09-23 DIAGNOSIS — F419 Anxiety disorder, unspecified: Secondary | ICD-10-CM | POA: Diagnosis not present

## 2017-09-23 DIAGNOSIS — M6281 Muscle weakness (generalized): Secondary | ICD-10-CM | POA: Diagnosis not present

## 2017-09-23 DIAGNOSIS — E119 Type 2 diabetes mellitus without complications: Secondary | ICD-10-CM | POA: Diagnosis not present

## 2017-09-23 DIAGNOSIS — K146 Glossodynia: Secondary | ICD-10-CM | POA: Diagnosis not present

## 2017-09-23 DIAGNOSIS — E785 Hyperlipidemia, unspecified: Secondary | ICD-10-CM | POA: Diagnosis not present

## 2017-09-26 DIAGNOSIS — K146 Glossodynia: Secondary | ICD-10-CM | POA: Diagnosis not present

## 2017-09-26 DIAGNOSIS — E1142 Type 2 diabetes mellitus with diabetic polyneuropathy: Secondary | ICD-10-CM | POA: Diagnosis not present

## 2017-09-26 DIAGNOSIS — I10 Essential (primary) hypertension: Secondary | ICD-10-CM | POA: Diagnosis not present

## 2017-09-26 DIAGNOSIS — H109 Unspecified conjunctivitis: Secondary | ICD-10-CM | POA: Diagnosis not present

## 2017-09-27 DIAGNOSIS — J9601 Acute respiratory failure with hypoxia: Secondary | ICD-10-CM | POA: Diagnosis not present

## 2017-09-27 DIAGNOSIS — E119 Type 2 diabetes mellitus without complications: Secondary | ICD-10-CM | POA: Diagnosis not present

## 2017-09-27 DIAGNOSIS — M1712 Unilateral primary osteoarthritis, left knee: Secondary | ICD-10-CM | POA: Diagnosis not present

## 2017-09-27 DIAGNOSIS — I10 Essential (primary) hypertension: Secondary | ICD-10-CM | POA: Diagnosis not present

## 2017-10-03 DIAGNOSIS — E785 Hyperlipidemia, unspecified: Secondary | ICD-10-CM | POA: Diagnosis not present

## 2017-10-03 DIAGNOSIS — E119 Type 2 diabetes mellitus without complications: Secondary | ICD-10-CM | POA: Diagnosis not present

## 2017-10-03 DIAGNOSIS — D649 Anemia, unspecified: Secondary | ICD-10-CM | POA: Diagnosis not present

## 2017-10-10 DIAGNOSIS — F419 Anxiety disorder, unspecified: Secondary | ICD-10-CM | POA: Diagnosis not present

## 2017-10-10 DIAGNOSIS — F331 Major depressive disorder, recurrent, moderate: Secondary | ICD-10-CM | POA: Diagnosis not present

## 2017-10-10 DIAGNOSIS — K5900 Constipation, unspecified: Secondary | ICD-10-CM | POA: Diagnosis not present

## 2017-10-12 DIAGNOSIS — K5900 Constipation, unspecified: Secondary | ICD-10-CM | POA: Diagnosis not present

## 2017-10-12 DIAGNOSIS — F419 Anxiety disorder, unspecified: Secondary | ICD-10-CM | POA: Diagnosis not present

## 2017-10-12 DIAGNOSIS — F331 Major depressive disorder, recurrent, moderate: Secondary | ICD-10-CM | POA: Diagnosis not present

## 2017-10-12 DIAGNOSIS — E1142 Type 2 diabetes mellitus with diabetic polyneuropathy: Secondary | ICD-10-CM | POA: Diagnosis not present

## 2017-10-15 DIAGNOSIS — M48062 Spinal stenosis, lumbar region with neurogenic claudication: Secondary | ICD-10-CM | POA: Diagnosis not present

## 2017-10-15 DIAGNOSIS — K219 Gastro-esophageal reflux disease without esophagitis: Secondary | ICD-10-CM | POA: Diagnosis not present

## 2017-10-15 DIAGNOSIS — Z9119 Patient's noncompliance with other medical treatment and regimen: Secondary | ICD-10-CM | POA: Diagnosis not present

## 2017-10-15 DIAGNOSIS — Z993 Dependence on wheelchair: Secondary | ICD-10-CM | POA: Diagnosis not present

## 2017-10-15 DIAGNOSIS — F419 Anxiety disorder, unspecified: Secondary | ICD-10-CM | POA: Diagnosis not present

## 2017-10-15 DIAGNOSIS — E119 Type 2 diabetes mellitus without complications: Secondary | ICD-10-CM | POA: Diagnosis not present

## 2017-10-15 DIAGNOSIS — J45909 Unspecified asthma, uncomplicated: Secondary | ICD-10-CM | POA: Diagnosis not present

## 2017-10-15 DIAGNOSIS — D649 Anemia, unspecified: Secondary | ICD-10-CM | POA: Diagnosis not present

## 2017-10-15 DIAGNOSIS — Z7984 Long term (current) use of oral hypoglycemic drugs: Secondary | ICD-10-CM | POA: Diagnosis not present

## 2017-10-15 DIAGNOSIS — M4306 Spondylolysis, lumbar region: Secondary | ICD-10-CM | POA: Diagnosis not present

## 2017-10-15 DIAGNOSIS — Z981 Arthrodesis status: Secondary | ICD-10-CM | POA: Diagnosis not present

## 2017-10-15 DIAGNOSIS — Z9181 History of falling: Secondary | ICD-10-CM | POA: Diagnosis not present

## 2017-10-15 DIAGNOSIS — F329 Major depressive disorder, single episode, unspecified: Secondary | ICD-10-CM | POA: Diagnosis not present

## 2017-10-15 DIAGNOSIS — I2510 Atherosclerotic heart disease of native coronary artery without angina pectoris: Secondary | ICD-10-CM | POA: Diagnosis not present

## 2017-10-15 DIAGNOSIS — I10 Essential (primary) hypertension: Secondary | ICD-10-CM | POA: Diagnosis not present

## 2017-10-15 DIAGNOSIS — E669 Obesity, unspecified: Secondary | ICD-10-CM | POA: Diagnosis not present

## 2017-10-17 DIAGNOSIS — R609 Edema, unspecified: Secondary | ICD-10-CM | POA: Diagnosis not present

## 2017-10-17 DIAGNOSIS — R112 Nausea with vomiting, unspecified: Secondary | ICD-10-CM | POA: Diagnosis not present

## 2017-10-17 DIAGNOSIS — I10 Essential (primary) hypertension: Secondary | ICD-10-CM | POA: Diagnosis not present

## 2017-10-17 DIAGNOSIS — R197 Diarrhea, unspecified: Secondary | ICD-10-CM | POA: Diagnosis not present

## 2017-10-17 DIAGNOSIS — B9689 Other specified bacterial agents as the cause of diseases classified elsewhere: Secondary | ICD-10-CM | POA: Diagnosis not present

## 2017-10-17 DIAGNOSIS — R109 Unspecified abdominal pain: Secondary | ICD-10-CM | POA: Diagnosis not present

## 2017-10-17 DIAGNOSIS — R1110 Vomiting, unspecified: Secondary | ICD-10-CM | POA: Diagnosis not present

## 2017-10-17 DIAGNOSIS — J45909 Unspecified asthma, uncomplicated: Secondary | ICD-10-CM | POA: Diagnosis not present

## 2017-10-17 DIAGNOSIS — Z79899 Other long term (current) drug therapy: Secondary | ICD-10-CM | POA: Diagnosis not present

## 2017-10-17 DIAGNOSIS — N3000 Acute cystitis without hematuria: Secondary | ICD-10-CM | POA: Diagnosis not present

## 2017-10-17 DIAGNOSIS — R1084 Generalized abdominal pain: Secondary | ICD-10-CM | POA: Diagnosis not present

## 2017-10-19 ENCOUNTER — Other Ambulatory Visit: Payer: Self-pay

## 2017-10-19 NOTE — Patient Outreach (Signed)
[facility]) Care Management  10/19/2017  Jeanette [name] 1940-09-29 [id number]  Transition of care  Referral date: 10/19/17 Referral source: discharged from [facility] rehab center on 10/13/17 Insurance: health team advantage  Telephone call to patient regarding transition of care referral. Contact answering phone states patient is in the hospital then proceed to hang up.  PLAN: RNCM will close patient due to patient being enrolled in an external program.  RNCM will send closure notification to patients primary MD   Quinn Plowman RN,BSN,CCM [facility] Telephonic  [phone]

## 2017-10-25 ENCOUNTER — Other Ambulatory Visit: Payer: Self-pay | Admitting: *Deleted

## 2017-10-25 NOTE — Patient Outreach (Signed)
Received request from Dr. Amalia Hailey [facility] Medical Director to investigate pt status. He states she is high risk for epidural abscess due to diabetes, obesity and prior episode from prior surgery in May 2018. He suggests that it would be beneficial for her MD to order a sed rate and CRP.  I note that Quinn Plowman, RN, had called pt on 10/19/17 and was told the pt was in the hospital.  I called the pt and talked with her husband. He reports she has not been in the hospital since her discharge early June. She was then at [facility] until the end of June. He reports that he may have told our caller that because he has been inundated with so many solicitous calls.  He reports she was evaluated by [facility] last week but services have not started. He blames HTA for not approving the request. I advised I will do some investigating and get back with him.  I called [facility] and was told that they have evaluated pt and they have sent Dr. Manson Allan orders but he has not faxed them back.  I called Dr. Doran Durand office to give a message from Dr. Amalia Hailey that he is concerned for potential epidural abscess and need for the labs mentioned above. Also to approve and send back the home health orders after he sees the pt tomorrow 10/26/17.  Called Mr. Gladman back and gave him the report that it is not HTA that is holding up the home health services. I advised that usually the MD needs a face to face to be able to order home health. Mr. Farrey was appreciative of this.  I requested if I could refer to one of community care managers to follow her progress in addition to home health. He agrees for this service. I have advised that we would usually be in contact by phone while home health is visiting and usually make one home visit during this time.  Will refer to CCM in the [place] area.  Reported back to Dr. Amalia Hailey the above actions and findings.  Eulah Pont. Myrtie Neither, MSN, [facility] Gerontological Nurse  Practitioner [facility] Care Management [phone]

## 2017-10-26 ENCOUNTER — Other Ambulatory Visit: Payer: Self-pay

## 2017-10-26 DIAGNOSIS — M48061 Spinal stenosis, lumbar region without neurogenic claudication: Secondary | ICD-10-CM | POA: Diagnosis not present

## 2017-10-26 DIAGNOSIS — R269 Unspecified abnormalities of gait and mobility: Secondary | ICD-10-CM | POA: Diagnosis not present

## 2017-10-26 DIAGNOSIS — E119 Type 2 diabetes mellitus without complications: Secondary | ICD-10-CM | POA: Diagnosis not present

## 2017-10-26 DIAGNOSIS — M545 Low back pain: Secondary | ICD-10-CM | POA: Diagnosis not present

## 2017-10-26 DIAGNOSIS — G894 Chronic pain syndrome: Secondary | ICD-10-CM | POA: Diagnosis not present

## 2017-10-26 NOTE — Patient Outreach (Signed)
Telephone outreach:  New referral for assessment of needs;  Placed call to patient, who answered. Explained reason for call.  Offered home visit for 10/28/2017 and patient has accepted.  PLAN: home visit on 10/28/2017  Confirmed address.  Provided my contact information.  Tomasa Rand, RN, BSN, CEN [facility] [employer] [phone]

## 2017-10-28 ENCOUNTER — Other Ambulatory Visit: Payer: Self-pay

## 2017-10-28 NOTE — Patient Outreach (Signed)
[facility]) Care Management   10/28/2017  Jeanette [name] October 26, 1940 [id number]  Jeanette [name] is an 77 y.o. female  Subjective:  Arrived for home visit. Husband present. Patient reports that she has some " bad" back surgery 2 years ago and decided to go see another back specialist who did a revision of surgery with placement of rods and pins on 09/15/2017 at [facility]. Patient reports she was discharged after 7 days to [place] for 20 days. Patient reports that she has been home since 10/14/2017 and has not been able to get therapy. Patient reports that she saw her primary MD on 10/26/2017 but has not heard from home health.  Patient reports that her legs are very weak and she continues to have back pain.  Husband assist patient will all care. Patient reports that she is able to get to bed side commode.  Patient also reports medications are very expensive and medical bills are high. She would like assistance with this. Husband is in agreement.   Objective:  Awake and alert, laying in hospital bed. Refused to get up at this time as she reports she is exhausted and did not sleep well last night.  Today's Vitals   10/28/17 0940 10/28/17 0944  BP: (!) 122/52   Pulse: 85   Resp: 18   SpO2: 94%   Weight: 170 lb (77.1 kg)   Height: 1.651 m (5\' 5" )   PainSc:  7    Review of Systems  Constitutional: Positive for malaise/fatigue and weight loss.  Eyes:       Wears glasses  Respiratory: Negative.   Cardiovascular: Negative.   Gastrointestinal: Positive for nausea.  Genitourinary: Negative.   Musculoskeletal: Positive for back pain and joint pain.  Skin: Negative.   Neurological: Positive for sensory change.       Numbness in feet.  Endo/Heme/Allergies: Bruises/bleeds easily.  Psychiatric/Behavioral:       Reports occasional problems with sleeping.     Physical Exam  Constitutional: She is oriented to person, place, and time. She appears well-developed and  well-nourished.  Cardiovascular: Normal rate, normal heart sounds and intact distal pulses.  Respiratory: Effort normal and breath sounds normal.  GI: Soft. Bowel sounds are normal.  Musculoskeletal: Normal range of motion.  Able to do pedal pushes with weak strength. Able to wiggles upon command. Able to bend knees upon command  Neurological: She is alert and oriented to person, place, and time.  Skin: Skin is warm and dry.  Back incision well healed,   Psychiatric: She has a normal mood and affect. Her behavior is normal. Judgment and thought content normal.    Encounter Medications:   Outpatient Encounter Medications as of 10/28/2017  Medication Sig  . acetaminophen (TYLENOL) 500 MG tablet Take 1,000 mg by mouth every 6 (six) hours as needed.  . Cholecalciferol (VITAMIN D3) 1000 units CAPS Take 1 capsule by mouth daily.  Marland Kitchen FLUoxetine (PROZAC) 20 MG capsule Take 20 mg by mouth daily.  Marland Kitchen gabapentin (NEURONTIN) 600 MG tablet Take 600 mg by mouth 3 (three) times daily.  . hydrALAZINE (APRESOLINE) 100 MG tablet Take 100 mg by mouth 2 (two) times daily.  . hydrochlorothiazide (HYDRODIURIL) 25 MG tablet Take 25 mg by mouth daily.  Marland Kitchen lisinopril (PRINIVIL,ZESTRIL) 40 MG tablet Take 40 mg by mouth daily.  . metFORMIN (GLUCOPHAGE) 500 MG tablet Take by mouth 2 (two) times daily with a meal.  . ondansetron (ZOFRAN) 8 MG tablet Take 8 mg  by mouth 2 (two) times daily as needed for nausea or vomiting.  Marland Kitchen oxyCODONE-acetaminophen (PERCOCET) 7.5-325 MG tablet Take 1 tablet by mouth every 4 (four) hours as needed.  . simvastatin (ZOCOR) 40 MG tablet Take 40 mg by mouth daily.  . vitamin C (ASCORBIC ACID) 500 MG tablet Take 500 mg by mouth daily.  . [DISCONTINUED] diclofenac (VOLTAREN) 75 MG EC tablet Take 1 tablet (75 mg total) by mouth 2 (two) times daily as needed for mild pain or moderate pain.  . [DISCONTINUED] pantoprazole (PROTONIX) 40 MG tablet Take 40 mg by mouth daily.  . [DISCONTINUED]  potassium chloride SA (K-DUR,KLOR-CON) 20 MEQ tablet Take 1 tablet (20 mEq total) by mouth daily.   No facility-administered encounter medications on file as of 10/28/2017.     Functional Status:   In your present state of health, do you have any difficulty performing the following activities: 10/28/2017  Hearing? N  Vision? N  Difficulty concentrating or making decisions? N  Walking or climbing stairs? Y  Dressing or bathing? Y  Doing errands, shopping? Y  Preparing Food and eating ? Y  Using the Toilet? Y  In the past six months, have you accidently leaked urine? N  Do you have problems with loss of bowel control? N  Managing your Medications? Y  Comment daughters fills pill boxes.  Medications are expense  Managing your Finances? Y  Housekeeping or managing your Housekeeping? N  Some recent data might be hidden    Fall/Depression Screening:    Fall Risk  10/28/2017  Falls in the past year? Yes  Number falls in past yr: 2 or more  Injury with Fall? No  Risk Factor Category  High Fall Risk  Follow up Falls evaluation completed;Falls prevention discussed   PHQ 2/9 Scores 10/28/2017  PHQ - 2 Score 0    Assessment:  Reviewed [facility] program and transition of care. Provided new patient packet, my business card, [facility] calendar. Consent signed.  (2) medication cost are high (3) medical bills are mounting up. (4) PT not started yet.  (5) difficulty ambulating (6) difficulty with bathing.   Plan:  (1) consent scanned into chart.  Next contact in 1 week.  (2) referral to [place] for medication assistance (3) referral to [place] for assistance with medical expenses.  (4) phone call to [facility], spoke to [place] who reports no orders received from MD office. Tillie Rung states they called office yesterday but office was closed after lunch. Placed call to MD office who states no orders placed yet. I explained importance of patient getting PT. Encouraged MD to complete  orders and send to [facility].  3pm  Follow up call to [facility] who states MD order received  And pt will be started on Sunday.   Update provided to [employer].  (5) encouraged patient to start exercises and complete recommended therapy. (6) referral to [place] worker.   Care planning and goal setting during home visit. Primary goal to to improve ambulation.  THN CM Care Plan Problem One     Most Recent Value  Care Plan Problem One  Recently discharged from rehab post back surgery.  Role Documenting the Problem One  Care Management [place] for Problem One  Active  THN Long Term Goal   Patient will report no readmissions to the hospital in the next 31 days.   THN Long Term Goal Start Date  10/28/17  Interventions for  Problem One Long Term Goal  Home visit completed. Transition of care reviewed.  THN CM Short Term Goal #1   Patient will report arrival of home health PT in the next 5 days.   THN CM Short Term Goal #1 Start Date  10/28/17  Interventions for Short Term Goal #1  Place call to MD office and enocuraged orders to be sent. Placed call to home health and comfirmed orders. Reviewed opening of case with husband.   THN CM Short Term Goal #2   Patient will report improved strength and mobilty in the next 30 days.   THN CM Short Term Goal #2 Start Date  10/28/17  Interventions for Short Term Goal #2  Reviewed importnace of daily exercises and increasing activity. Encouraged patient to take pain medications as needed to be able to complete  home exercise program. Reviewed fall precautions.       Tomasa Rand, RN, BSN, CEN [facility] [employer] [phone]

## 2017-10-31 ENCOUNTER — Telehealth: Payer: Self-pay | Admitting: Pharmacist

## 2017-10-31 NOTE — Patient Outreach (Addendum)
[facility]) Care Management  10/31/2017  [name] 07-Jan-1941 [id number]   Left HIPAA compliant message for Jeanette [name] with her husband regarding medication assistance.   Plan: -Will f/u in 3-5 business days if call not returned -Unsuccessful outreach letter routed  Regina Eck, PharmD, [address]  [phone]

## 2017-11-02 ENCOUNTER — Other Ambulatory Visit: Payer: Self-pay | Admitting: *Deleted

## 2017-11-02 NOTE — Patient Outreach (Signed)
[facility]) Care Management  11/02/2017  Jeanette [name] 1940/04/29 [id number]   CSW made initial phone outreach attempt today without success. CSW noted  [facility] RPH has already  sent unsuccessful outreach letter to pt.  CSW will plan outreach attempt to pt again in the next 3-4 business days.   Eduard Clos, MSW, [place] Worker  [name] [phone]

## 2017-11-03 ENCOUNTER — Other Ambulatory Visit: Payer: Self-pay

## 2017-11-03 ENCOUNTER — Ambulatory Visit: Payer: Self-pay | Admitting: Pharmacist

## 2017-11-03 ENCOUNTER — Other Ambulatory Visit: Payer: Self-pay | Admitting: *Deleted

## 2017-11-03 ENCOUNTER — Other Ambulatory Visit: Payer: Self-pay | Admitting: Pharmacist

## 2017-11-03 NOTE — Patient Outreach (Addendum)
Jeanette [name] [facility] [name]) Care Management  11/03/2017  Jeanette [name] Aug 19, 1940 [id number]  77 year old female referred to [place] [facility] (pharmacy) for medication assistance.  PMH significant for, but not limited to: HTN, DMT2 (not on insulin), depression, HLD.  Perreview ofnotes: patient reported "medication bills are very expensive and medical bills are high".  Insurance: HTA  SUBJECTIVE: Successful call to Jeanette [name] today. HIPAA identifiers verified.  Jeanette [name] is agreeable to review medications telephonically.  She states her copays "really aren't that bad since they are all generic", however her husbands copays and her medical bills make it challenging.  She states she doesn't qualify for programs, but couldn't recall the names of them.  OBJECTIVE: Medications Reviewed Today    Reviewed by Lavera Guise, [facility] (Pharmacist) on 11/03/17 at 1139  Med List Status: <None>  Medication Order Taking? Sig Documenting Provider Last Dose Status Informant  acetaminophen (TYLENOL) 500 MG tablet [id number] Yes Take 1,000 mg by mouth every 6 (six) hours as needed. [provider] Taking Active   FLUoxetine (PROZAC) 20 MG capsule [id number] Yes Take 20 mg by mouth daily. [provider] Taking Active Multiple Informants  gabapentin (NEURONTIN) 600 MG tablet [id number] Yes Take 600 mg by mouth 3 (three) times daily. [provider] Taking Active   hydrALAZINE (APRESOLINE) 100 MG tablet [id number] Yes Take 100 mg by mouth 2 (two) times daily. [provider] Taking Active Multiple Informants  hydrochlorothiazide (HYDRODIURIL) 25 MG tablet [id number] Yes Take 25 mg by mouth daily. [provider] Taking Active Multiple Informants  lisinopril (PRINIVIL,ZESTRIL) 40 MG tablet [id number] Yes Take 40 mg by mouth daily. [provider] Taking Active Multiple Informants  metFORMIN (GLUCOPHAGE) 500 MG tablet [id number] Yes Take by mouth 2 (two)  times daily with a meal. [provider] Taking Active   ondansetron (ZOFRAN) 8 MG tablet [id number] Yes Take 8 mg by mouth 2 (two) times daily as needed for nausea or vomiting. [provider] Taking Active Multiple Informants  simvastatin (ZOCOR) 40 MG tablet [id number] Yes Take 40 mg by mouth daily. [provider] Taking Active Multiple Informants  vitamin C (ASCORBIC ACID) 500 MG tablet [id number] Yes Take 500 mg by mouth daily. [provider] Taking Active         ASSESSMENT:  Drugs sorted by system:  Neurologic/Psychologic: fluoxetine, gabapentin  Cardiovascular: lisinopril, HCTZ, hydralazine, simvastatin  Gastrointestinal: Zofran PRN  Endocrine: metformin  Pain: APAP PRN  MEDICATION ASSISTANCE -Upon review of monthly household income, patient does not qualify for Extra Help (LIS) per Medicare.gov -All of patient's medications are generic and do not offer patient assistance programs by the manufacturer  -Reviewed the following cost-saving options:   -switching medications to mail order pharmacy (would fill a 3 month supply & save 1 monthly copay for patient)  -utilizing [employer]'s $4 list since almost all of her medications would qualify  Patient respectfully declined all of the above options.  Plan: -I will close this case. I am happy to assist as needed in the future.  Regina Eck, PharmD, [place]  [phone]

## 2017-11-03 NOTE — Patient Outreach (Signed)
Transition of care:  Placed call to patient who reports that she about the same. Reports PT started and then stopped. Patient reports that she needs more therapy than 4 visits.  Placed call to [place] and spoke with Danton Clap who reports patient will continue to get therapy. Next visit planned for Monday.  Patient is pending more visit approval.   Placed call back to patient to inform. Patient appreciative of call and assistance.   PLAN: will continue weekly transition of care calls.  Tomasa Rand, RN, BSN, CEN [facility] [employer] [phone]

## 2017-11-03 NOTE — Patient Outreach (Signed)
[place] [facility]) Care Management  11/03/2017  Jeanette [name] May 26, 1940 [id number]   CSW was able to speak with pt by phone and confirmed pt's identity.  CSW introduced self and role and reason for call. Pt's husband was in the background and appeared to be helping her to answer questions related to financial concerns. "we have hundreds of thousands of dollars in medical bills". Pt reports she has had multiple hospital admissions and has outstanding medical bills that they cannot pay. "we haven't even opened the bills coming in because we know we can't pay them". Pt reports that her insurance has covered approximately 80% of bills.  Pt and husband advised that they may want to apply for Medicaid and in order to that, will need to compile the outstanding medical bills.  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  CSW advised them of plans to call back next week to see the status of the bills they are able to organize and compile.  They are appreciative and agreeable to this plan.   CSW also reminded pt that [facility] RPH and RNCM were attempting to reach her also for support.    Eduard Clos, MSW, [place] Worker  [place] [phone]

## 2017-11-09 DIAGNOSIS — Z792 Long term (current) use of antibiotics: Secondary | ICD-10-CM | POA: Diagnosis not present

## 2017-11-09 DIAGNOSIS — J45998 Other asthma: Secondary | ICD-10-CM | POA: Diagnosis not present

## 2017-11-09 DIAGNOSIS — R0902 Hypoxemia: Secondary | ICD-10-CM | POA: Diagnosis not present

## 2017-11-09 DIAGNOSIS — S8991XA Unspecified injury of right lower leg, initial encounter: Secondary | ICD-10-CM | POA: Diagnosis not present

## 2017-11-09 DIAGNOSIS — R42 Dizziness and giddiness: Secondary | ICD-10-CM | POA: Diagnosis not present

## 2017-11-09 DIAGNOSIS — M4626 Osteomyelitis of vertebra, lumbar region: Secondary | ICD-10-CM | POA: Diagnosis not present

## 2017-11-09 DIAGNOSIS — M545 Low back pain: Secondary | ICD-10-CM | POA: Diagnosis not present

## 2017-11-09 DIAGNOSIS — W19XXXA Unspecified fall, initial encounter: Secondary | ICD-10-CM | POA: Diagnosis not present

## 2017-11-09 DIAGNOSIS — I10 Essential (primary) hypertension: Secondary | ICD-10-CM | POA: Diagnosis not present

## 2017-11-09 DIAGNOSIS — I959 Hypotension, unspecified: Secondary | ICD-10-CM | POA: Diagnosis not present

## 2017-11-09 DIAGNOSIS — R0602 Shortness of breath: Secondary | ICD-10-CM | POA: Diagnosis not present

## 2017-11-09 DIAGNOSIS — Z79899 Other long term (current) drug therapy: Secondary | ICD-10-CM | POA: Diagnosis not present

## 2017-11-09 DIAGNOSIS — M79604 Pain in right leg: Secondary | ICD-10-CM | POA: Diagnosis not present

## 2017-11-09 DIAGNOSIS — R52 Pain, unspecified: Secondary | ICD-10-CM | POA: Diagnosis not present

## 2017-11-09 DIAGNOSIS — M25561 Pain in right knee: Secondary | ICD-10-CM | POA: Diagnosis not present

## 2017-11-09 DIAGNOSIS — R509 Fever, unspecified: Secondary | ICD-10-CM | POA: Diagnosis not present

## 2017-11-09 DIAGNOSIS — E119 Type 2 diabetes mellitus without complications: Secondary | ICD-10-CM | POA: Diagnosis not present

## 2017-11-10 ENCOUNTER — Ambulatory Visit: Payer: Self-pay

## 2017-11-10 ENCOUNTER — Other Ambulatory Visit: Payer: Self-pay

## 2017-11-10 ENCOUNTER — Other Ambulatory Visit: Payer: Self-pay | Admitting: *Deleted

## 2017-11-10 DIAGNOSIS — Z794 Long term (current) use of insulin: Secondary | ICD-10-CM | POA: Diagnosis not present

## 2017-11-10 DIAGNOSIS — I10 Essential (primary) hypertension: Secondary | ICD-10-CM | POA: Diagnosis not present

## 2017-11-10 DIAGNOSIS — R9431 Abnormal electrocardiogram [ECG] [EKG]: Secondary | ICD-10-CM | POA: Diagnosis not present

## 2017-11-10 DIAGNOSIS — M79671 Pain in right foot: Secondary | ICD-10-CM | POA: Diagnosis not present

## 2017-11-10 DIAGNOSIS — Z90710 Acquired absence of both cervix and uterus: Secondary | ICD-10-CM | POA: Diagnosis not present

## 2017-11-10 DIAGNOSIS — R410 Disorientation, unspecified: Secondary | ICD-10-CM | POA: Diagnosis not present

## 2017-11-10 DIAGNOSIS — R262 Difficulty in walking, not elsewhere classified: Secondary | ICD-10-CM | POA: Diagnosis not present

## 2017-11-10 DIAGNOSIS — I517 Cardiomegaly: Secondary | ICD-10-CM | POA: Diagnosis not present

## 2017-11-10 DIAGNOSIS — Z79899 Other long term (current) drug therapy: Secondary | ICD-10-CM | POA: Diagnosis not present

## 2017-11-10 DIAGNOSIS — M4624 Osteomyelitis of vertebra, thoracic region: Secondary | ICD-10-CM | POA: Diagnosis not present

## 2017-11-10 DIAGNOSIS — E119 Type 2 diabetes mellitus without complications: Secondary | ICD-10-CM | POA: Diagnosis not present

## 2017-11-10 DIAGNOSIS — M48061 Spinal stenosis, lumbar region without neurogenic claudication: Secondary | ICD-10-CM | POA: Diagnosis not present

## 2017-11-10 DIAGNOSIS — Z7984 Long term (current) use of oral hypoglycemic drugs: Secondary | ICD-10-CM | POA: Diagnosis not present

## 2017-11-10 DIAGNOSIS — M549 Dorsalgia, unspecified: Secondary | ICD-10-CM | POA: Diagnosis not present

## 2017-11-10 DIAGNOSIS — M545 Low back pain: Secondary | ICD-10-CM | POA: Diagnosis not present

## 2017-11-10 DIAGNOSIS — E785 Hyperlipidemia, unspecified: Secondary | ICD-10-CM | POA: Diagnosis not present

## 2017-11-10 DIAGNOSIS — S32038A Other fracture of third lumbar vertebra, initial encounter for closed fracture: Secondary | ICD-10-CM | POA: Diagnosis not present

## 2017-11-10 DIAGNOSIS — B957 Other staphylococcus as the cause of diseases classified elsewhere: Secondary | ICD-10-CM | POA: Diagnosis not present

## 2017-11-10 DIAGNOSIS — D649 Anemia, unspecified: Secondary | ICD-10-CM | POA: Diagnosis not present

## 2017-11-10 DIAGNOSIS — E1159 Type 2 diabetes mellitus with other circulatory complications: Secondary | ICD-10-CM | POA: Diagnosis not present

## 2017-11-10 DIAGNOSIS — R509 Fever, unspecified: Secondary | ICD-10-CM | POA: Diagnosis not present

## 2017-11-10 DIAGNOSIS — Z981 Arthrodesis status: Secondary | ICD-10-CM | POA: Diagnosis not present

## 2017-11-10 DIAGNOSIS — E7800 Pure hypercholesterolemia, unspecified: Secondary | ICD-10-CM | POA: Diagnosis not present

## 2017-11-10 DIAGNOSIS — I152 Hypertension secondary to endocrine disorders: Secondary | ICD-10-CM | POA: Diagnosis not present

## 2017-11-10 DIAGNOSIS — M4626 Osteomyelitis of vertebra, lumbar region: Secondary | ICD-10-CM | POA: Diagnosis not present

## 2017-11-10 DIAGNOSIS — J9601 Acute respiratory failure with hypoxia: Secondary | ICD-10-CM | POA: Diagnosis not present

## 2017-11-10 DIAGNOSIS — Z9049 Acquired absence of other specified parts of digestive tract: Secondary | ICD-10-CM | POA: Diagnosis not present

## 2017-11-10 DIAGNOSIS — L02818 Cutaneous abscess of other sites: Secondary | ICD-10-CM | POA: Diagnosis not present

## 2017-11-10 DIAGNOSIS — I447 Left bundle-branch block, unspecified: Secondary | ICD-10-CM | POA: Diagnosis not present

## 2017-11-10 DIAGNOSIS — M4620 Osteomyelitis of vertebra, site unspecified: Secondary | ICD-10-CM | POA: Insufficient documentation

## 2017-11-10 DIAGNOSIS — Z96653 Presence of artificial knee joint, bilateral: Secondary | ICD-10-CM | POA: Diagnosis not present

## 2017-11-10 DIAGNOSIS — M4627 Osteomyelitis of vertebra, lumbosacral region: Secondary | ICD-10-CM | POA: Diagnosis not present

## 2017-11-10 DIAGNOSIS — F419 Anxiety disorder, unspecified: Secondary | ICD-10-CM | POA: Diagnosis not present

## 2017-11-10 DIAGNOSIS — M4326 Fusion of spine, lumbar region: Secondary | ICD-10-CM | POA: Diagnosis not present

## 2017-11-10 DIAGNOSIS — Z9689 Presence of other specified functional implants: Secondary | ICD-10-CM | POA: Diagnosis not present

## 2017-11-10 DIAGNOSIS — E1169 Type 2 diabetes mellitus with other specified complication: Secondary | ICD-10-CM | POA: Diagnosis not present

## 2017-11-10 DIAGNOSIS — F329 Major depressive disorder, single episode, unspecified: Secondary | ICD-10-CM | POA: Diagnosis not present

## 2017-11-10 DIAGNOSIS — S22088A Other fracture of T11-T12 vertebra, initial encounter for closed fracture: Secondary | ICD-10-CM | POA: Diagnosis not present

## 2017-11-10 HISTORY — DX: Osteomyelitis of vertebra, site unspecified: M46.20

## 2017-11-10 MED ORDER — GABAPENTIN 300 MG PO CAPS
600.00 | ORAL_CAPSULE | ORAL | Status: DC
Start: 2017-11-16 — End: 2017-11-10

## 2017-11-10 MED ORDER — FLUOXETINE HCL 20 MG PO CAPS
20.00 | ORAL_CAPSULE | ORAL | Status: DC
Start: 2017-11-16 — End: 2017-11-10

## 2017-11-10 MED ORDER — DEXTROSE 10 % IV SOLN
125.00 | INTRAVENOUS | Status: DC
Start: ? — End: 2017-11-10

## 2017-11-10 MED ORDER — HYDRALAZINE HCL 50 MG PO TABS
100.00 | ORAL_TABLET | ORAL | Status: DC
Start: 2017-11-16 — End: 2017-11-10

## 2017-11-10 MED ORDER — SENNOSIDES-DOCUSATE SODIUM 8.6-50 MG PO TABS
2.00 | ORAL_TABLET | ORAL | Status: DC
Start: 2017-11-16 — End: 2017-11-10

## 2017-11-10 MED ORDER — VITAMIN C 500 MG PO TABS
500.00 | ORAL_TABLET | ORAL | Status: DC
Start: 2017-11-17 — End: 2017-11-10

## 2017-11-10 MED ORDER — GENERIC EXTERNAL MEDICATION
3.38 g | Status: DC
Start: 2017-11-10 — End: 2017-11-10

## 2017-11-10 MED ORDER — CALCIUM CITRATE 200 MG PO TABS
950.00 | ORAL_TABLET | ORAL | Status: DC
Start: 2017-11-17 — End: 2017-11-10

## 2017-11-10 MED ORDER — GENERIC EXTERNAL MEDICATION
1.25 g | Status: DC
Start: 2017-11-10 — End: 2017-11-10

## 2017-11-10 MED ORDER — INSULIN LISPRO 100 UNIT/ML ~~LOC~~ SOLN
2.00 | SUBCUTANEOUS | Status: DC
Start: 2017-11-17 — End: 2017-11-10

## 2017-11-10 MED ORDER — SODIUM CHLORIDE 0.9 % IV SOLN
INTRAVENOUS | Status: DC
Start: ? — End: 2017-11-10

## 2017-11-10 MED ORDER — ACETAMINOPHEN 500 MG PO TABS
1000.00 | ORAL_TABLET | ORAL | Status: DC
Start: 2017-11-16 — End: 2017-11-10

## 2017-11-10 MED ORDER — PRAVASTATIN SODIUM 40 MG PO TABS
80.00 | ORAL_TABLET | ORAL | Status: DC
Start: 2017-11-16 — End: 2017-11-10

## 2017-11-10 MED ORDER — GENERIC EXTERNAL MEDICATION
600.00 | Status: DC
Start: 2017-11-11 — End: 2017-11-10

## 2017-11-10 MED ORDER — ONDANSETRON 4 MG PO TBDP
4.00 | ORAL_TABLET | ORAL | Status: DC
Start: ? — End: 2017-11-10

## 2017-11-10 NOTE — Patient Outreach (Signed)
[place] [facility]) Care Management  11/10/2017  Jeanette [name] 12-28-1940 [id number]   [place] contacted pt today by phone who reports pt was admitted to [facility] due to an infection. He indicated they have not had time to compile the medical bills. CSW encouraged him to ask to speak with someone at the hospital that may be able to help with linking them with financial assistance/support including Medicaid.  CSW will update [facility] team and plans to sign off. Daneen Schick, BSW, [facility] SW, is also engaging pt and family for support and service linking.   Eduard Clos, MSW, [place] Worker  [place] [phone]

## 2017-11-10 NOTE — Patient Outreach (Signed)
Transition of care:  Attempted to reach patient for transition of care. No answer. Left a message requesting a call back.  In basket message received from [place] worker who states that patient is admitted for spinal infection. Reviewed care everywhere and confirmed admission. Appears a plan for patient to be admitted until at least 11/14/2017.   PLAN: will continue to follow up with patient for discharged plans.  Tomasa Rand, RN, BSN, CEN [facility] [employer] [phone]

## 2017-11-11 DIAGNOSIS — D649 Anemia, unspecified: Secondary | ICD-10-CM

## 2017-11-11 HISTORY — DX: Anemia, unspecified: D64.9

## 2017-11-14 DIAGNOSIS — R262 Difficulty in walking, not elsewhere classified: Secondary | ICD-10-CM | POA: Insufficient documentation

## 2017-11-14 HISTORY — DX: Difficulty in walking, not elsewhere classified: R26.2

## 2017-11-15 ENCOUNTER — Ambulatory Visit: Payer: Self-pay

## 2017-11-15 ENCOUNTER — Other Ambulatory Visit: Payer: Self-pay

## 2017-11-15 NOTE — Patient Outreach (Signed)
[place] [facility]) Care Management  11/15/2017  Jeanette [name] 03-Dec-1940 [id number]  BSW placed a call to the patients home to assist with Medicaid application and other identified community resources. BSW spoke with the patient's spouse, Jenny Reichmann. John reports the patient is still at [facility] and may discharge home tomorrow. BSW to call later in the week to assist with linking to resources.   Daneen Schick, BSW, CDP [facility] [phone]

## 2017-11-16 ENCOUNTER — Other Ambulatory Visit: Payer: Self-pay | Admitting: Pharmacist

## 2017-11-16 NOTE — Patient Outreach (Signed)
[place] [facility]) Care Management  11/16/2017  Jeanette [name] [birth year] [id number]    Successful call to patient's husband's mobile phone regarding medications for [name] (did not answer home phone, patient still in hospital).  HTA patient called in to RN line to see if antibiotic could be  He was in route to the hospital to pick up the patient for discharge home.  [facility] Pharmacist asked if patient was going home on IV vs PO ABx, but husband seemed unsure of the plan.  He stated he would call me back when they got home.   Per CareEverywhere, patient is being discharged home on IV Abx (vancomycin IV and zosyn IV) to complete 6 weeks for vertebral osteo. PO rifampin to be added to regimen potentially given patient's hardware.  [facility] to confirm PO rifampin was filled and picked up by patient. No issues in regard to the PO antibiotic.  PLAN: -I will f/u with patient this evening or tomorrow upon their return home or sooner if call returned    Regina Eck, PharmD, [place]  [phone]

## 2017-11-17 ENCOUNTER — Other Ambulatory Visit: Payer: Self-pay | Admitting: Pharmacist

## 2017-11-17 ENCOUNTER — Other Ambulatory Visit: Payer: Self-pay | Admitting: *Deleted

## 2017-11-17 NOTE — Patient Outreach (Signed)
[place] [facility]) Care Management  11/17/2017  Jeanette [name] March 06, 1941 [id number]   Unsuccessful outreach call to patient's home number.  HIPAA compliant message left on home phone. Unsuccessful call to patient's spouse contact number, Brand Males.  Phone rang >20 times with no voicemail set up.   PLAN: -Will plan to call patient within 3-4 business unless call returned sooner -Unsuccessful outreach letter sent by RN   Regina Eck, PharmD, [place]  [phone]

## 2017-11-17 NOTE — Patient Outreach (Signed)
[facility]) Care Management  [place]  11/17/2017   Jeanette [name] [birth year] [id number]    Transition of care call  Follow up on planned discharge from [facility] on 7/31 per husband call to 24 nurse line on 7/31, with concern regarding patient discharge awaiting insurance approval. Noted [facility] pharmacist call attempt to spouse on yesterday.    Subjective:  Unsuccessful outreach call to patient home number no answer able to leave a HIPAA compliant message for return call. Placed call to patient spouse contact number, Jeanette [name], no answer no voice mail set up unable to leave a message.    Plan  Will await return call , if no response will send unsuccessful outreach letter and plan return call in the next 3 to 4 business days.  Will update assigned care coordinator Jeanette [name].  Joylene Draft, RN, [place] Management Coordinator  [phone]- Mobile [phone]- Toll Free Main Office

## 2017-11-18 ENCOUNTER — Other Ambulatory Visit: Payer: Self-pay | Admitting: *Deleted

## 2017-11-18 ENCOUNTER — Other Ambulatory Visit: Payer: Self-pay

## 2017-11-18 MED ORDER — HYDROCHLOROTHIAZIDE 25 MG PO TABS
25.00 | ORAL_TABLET | ORAL | Status: DC
Start: 2017-11-17 — End: 2017-11-18

## 2017-11-18 MED ORDER — NYSTATIN 100000 UNIT/ML MT SUSP
500000.00 | OROMUCOSAL | Status: DC
Start: 2017-11-16 — End: 2017-11-18

## 2017-11-18 MED ORDER — DIPHENHYDRAMINE HCL 50 MG/ML IJ SOLN
25.00 | INTRAMUSCULAR | Status: DC
Start: ? — End: 2017-11-18

## 2017-11-18 MED ORDER — GENERIC EXTERNAL MEDICATION
12.00 g | Status: DC
Start: 2017-11-17 — End: 2017-11-18

## 2017-11-18 MED ORDER — LISINOPRIL 20 MG PO TABS
40.00 | ORAL_TABLET | ORAL | Status: DC
Start: 2017-11-17 — End: 2017-11-18

## 2017-11-18 MED ORDER — MELATONIN 3 MG PO TABS
6.00 | ORAL_TABLET | ORAL | Status: DC
Start: 2017-11-16 — End: 2017-11-18

## 2017-11-18 MED ORDER — RIFAMPIN 300 MG PO CAPS
300.00 | ORAL_CAPSULE | ORAL | Status: DC
Start: 2017-11-16 — End: 2017-11-18

## 2017-11-18 MED ORDER — FERROUS SULFATE 325 (65 FE) MG PO TABS
325.00 | ORAL_TABLET | ORAL | Status: DC
Start: 2017-11-17 — End: 2017-11-18

## 2017-11-18 MED ORDER — POLYETHYLENE GLYCOL 3350 17 G PO PACK
17.00 g | PACK | ORAL | Status: DC
Start: 2017-11-17 — End: 2017-11-18

## 2017-11-18 NOTE — Patient Outreach (Signed)
[facility] [facility]) Care Management  [place]  11/18/2017   Jeanette [name] 1941-03-23 [id number]   Transition of care call  Covering for assigned care coordinator Tomasa Rand.  Subjective:  Placed call to patient her husband answered phone, HIPAA information verified, (listed on [facility] consent). He reports patient is resting at this time, introduced self as coworker with Tomasa Rand.  Spouse discussed patient was discharged from [facility] on [facility], reports she had a infection in her back and she will be on IV antibiotics at home via a PICC line .   Mr.[name] reports patient has all her medications at home from discharge from hospital he is not able to review at this time but to return call later and patient will be able to.  Spouse reports [facility] home health RN to visit on today, to change out IV antibiotic, he reports that it runs for 2 days before it needs to changed. Home health PT to visit on Sunday.  Returned call to patient , HIPAA verified,patient reports that she is tired discussed some difficulty with getting home health orders straightened out. She reports [facility] home health RN was at her home about 2 hours getting home health orders  arranged able to get things set up through MD at [facility], patient states her  PCP office closed early today.   Wife reports antibiotic via PICC line will need to be replaced every   2 days and they have instructed her husband how to change it again on Sunday, patient unable to recall name of antibiotic, per care everywhere , listed Oxacillin that will continue for 6 weeks.   Patient reports tolerating up to [facility] reports that she has a walker for use but it will not fit trough the door of bathroom so she uses BSC.  Discussed scheduling post discharge visit with PCP patient states office closed early on today but she will follow up .    Patient was recently discharged from hospital and all medications have been reviewed.  Patient reports organizing her own medications.   Encounter Medications:  Outpatient Encounter Medications as of 11/18/2017  Medication Sig  . acetaminophen (TYLENOL) 500 MG tablet Take 1,000 mg by mouth every 6 (six) hours as needed.  Marland Kitchen FLUoxetine (PROZAC) 20 MG capsule Take 20 mg by mouth daily.  Marland Kitchen gabapentin (NEURONTIN) 600 MG tablet Take 600 mg by mouth 3 (three) times daily.  . hydrALAZINE (APRESOLINE) 100 MG tablet Take 100 mg by mouth 2 (two) times daily.  . hydrochlorothiazide (HYDRODIURIL) 25 MG tablet Take 25 mg by mouth daily.  Marland Kitchen lisinopril (PRINIVIL,ZESTRIL) 40 MG tablet Take 40 mg by mouth daily.  . metFORMIN (GLUCOPHAGE) 500 MG tablet Take by mouth 2 (two) times daily with a meal.  . ondansetron (ZOFRAN) 8 MG tablet Take 8 mg by mouth 2 (two) times daily as needed for nausea or vomiting.  . simvastatin (ZOCOR) 40 MG tablet Take 40 mg by mouth daily.  . vitamin C (ASCORBIC ACID) 500 MG tablet Take 500 mg by mouth daily.   No facility-administered encounter medications on file as of 11/18/2017.     Plan:  Will follow  patient for transition of care program for weekly outreach , will update assigned care coordinator for follow up. Reinforced fall precautions   Joylene Draft, RN, [place] Management Coordinator  [phone]- Mobile [phone]- Toll Free Main Office

## 2017-11-18 NOTE — Patient Outreach (Signed)
[place] [facility]) Care Management  11/18/2017  Jeanette [name] 1940-09-25 [id number]  Successful outreach to the patient on today's date. HIPAA identifiers confirmed. The patient is in agreement to work with BSW on [place] application. Unfortunately, the patient returned home from the hospital this week and is requesting BSW contact at a later date for application completion. BSW to mail the patient a Medicaid application as well as a brochure for [employer]) in home caregiver program. BSW to outreach the patient in the next two weeks to assist with application process.  Daneen Schick, BSW, CDP [facility] [phone]

## 2017-11-21 ENCOUNTER — Other Ambulatory Visit: Payer: Self-pay

## 2017-11-21 DIAGNOSIS — T888XXA Other specified complications of surgical and medical care, not elsewhere classified, initial encounter: Secondary | ICD-10-CM | POA: Diagnosis not present

## 2017-11-21 DIAGNOSIS — Z792 Long term (current) use of antibiotics: Secondary | ICD-10-CM | POA: Diagnosis not present

## 2017-11-21 NOTE — Patient Outreach (Signed)
Transition of care:  Placed call to husband who reports that patient is doing well. Reports being active with home health.  Husband reports that he is comfortable giving IV antibiotic through the PICC line. Reports no new falls.  Husband reports that patient has a follow up appointment with primary MD on Wednesday. Husband reports that he will provide transportation.  PLAN: will continue weekly transition of care calls.  Tomasa Rand, RN, BSN, CEN [facility] [employer] [phone]

## 2017-11-22 ENCOUNTER — Ambulatory Visit: Payer: Self-pay | Admitting: *Deleted

## 2017-11-23 ENCOUNTER — Ambulatory Visit: Payer: Self-pay | Admitting: Pharmacist

## 2017-11-23 ENCOUNTER — Other Ambulatory Visit: Payer: Self-pay | Admitting: Pharmacist

## 2017-11-23 DIAGNOSIS — M4620 Osteomyelitis of vertebra, site unspecified: Secondary | ICD-10-CM | POA: Diagnosis not present

## 2017-11-23 NOTE — Patient Outreach (Addendum)
[place] [facility]) Care Management  11/23/2017  Jeanette [name] 1940/06/20 [id number]  Outreach call attempt #2 to patient's home number.  HIPAA compliant message left on home phone. Unsuccessful call to patient's spouse contact number, Brand Males with no answer (rang >20 times with no voicemail set up).    Per notes, it appears patient is doing well at home with her IV/PO Abx and no issues obtaining medication.  PICC line currently in place.  Patient is currently active with home health and [place] for transitions of care.   PLAN: -Will plan to call patient within 3-4 business unless call returned sooner -[place] will plan to sign off case if there are no additional pharmacy needs    Regina Eck, PharmD, [place]  [phone]

## 2017-11-25 ENCOUNTER — Ambulatory Visit: Payer: Self-pay

## 2017-11-28 ENCOUNTER — Other Ambulatory Visit: Payer: Self-pay

## 2017-11-28 DIAGNOSIS — Z792 Long term (current) use of antibiotics: Secondary | ICD-10-CM | POA: Diagnosis not present

## 2017-11-28 DIAGNOSIS — T888XXA Other specified complications of surgical and medical care, not elsewhere classified, initial encounter: Secondary | ICD-10-CM | POA: Diagnosis not present

## 2017-11-28 NOTE — Patient Outreach (Addendum)
Transition of care:  Placed call to patient and spoke with husband who reports patient is getting stronger slowly.  Reports she saw primary MD last week for follow up.  Patient has currently not started home health PT.  Husband reports home IV antibiotics are going well. Reports patient had to stop Rifampin due to vomiting.   Placed call to [facility] and spoke with Ivin Booty who reports that they are waiting for orders to be signed by Dr. Dan Europe with [facility].    Order request was refaxed today.  Placed phone call back to husband to confirm patient had primary MD follow up and he confirms.  Placed another call to [facility] and spoke with Tillie Rung who reports they will call MD office to get orders for PT.   Updated husband.   PLAN: will continue weekly transition of care calls.  Tomasa Rand, RN, BSN, CEN [facility] [employer] [phone]

## 2017-11-29 ENCOUNTER — Ambulatory Visit: Payer: Self-pay | Admitting: Pharmacist

## 2017-11-29 ENCOUNTER — Other Ambulatory Visit: Payer: Self-pay | Admitting: Pharmacist

## 2017-11-29 ENCOUNTER — Other Ambulatory Visit: Payer: Self-pay

## 2017-11-29 NOTE — Patient Outreach (Signed)
Jeanette [name] [facility]) Care Management  11/29/2017  Jeanette [name] [birth year] [id number]  Successful call to patient's spouse medication assistance. HIPAA identifiers verified.  Patient still on IV Abx and only has two more days of supply remaining.  [facility] RNCM in contact with patient's home health agency regarding medication orders.  Of note, rifampin stopped due to side effects (nausea/vomting).  No other medications needs or assistance is requested by patient or spouse.  Patient will remain active with [facility] RNCM.  Will alert disciplines of case closure.   PLAN: -Intolerance to rifampin entered in [place] will close case as patient is being followed by home health and [facility] RNCM  Regina Eck, PharmD, [name]  [phone]

## 2017-11-29 NOTE — Patient Outreach (Signed)
[place] [facility]) Care Management  11/29/2017  Jeanette [name] 07/22/1940 [id number]  Successful outreach to the patient on today's date, HIPAA identifiers confirmed. The patient confirms receipt of Medicaid application as well as information on [employer] The patient has yet to begin completion of the Medicaid application stating "it's a lot". BSW encouraged the patient to complete the application as soon as she is able and to turn into her local DSS. BSW explained that the application can be submitted without all requested documents in order to begin process. The patient stated understanding.  BSW reviewed [employer] home caregiver program. The patient is aware this program will not provide serivces while the patient is active with home health. BSW encouraged the patient to contact [employer] when home health services are completed in efforts to obtain in home support. The patient stated understanding.  BSW to perform a discipline closure on today's date as the patient is unable to identify any other needed resources. The patient is aware she may contact this BSW if future needs are identified. The patient understands she will continue to be active with [place] and [facility] Pharmacist. BSW to alert both disciplines of social work closure.  Jeanette [name], BSW, CDP [facility] [phone]

## 2017-12-01 DIAGNOSIS — B958 Unspecified staphylococcus as the cause of diseases classified elsewhere: Secondary | ICD-10-CM | POA: Diagnosis not present

## 2017-12-01 DIAGNOSIS — M4620 Osteomyelitis of vertebra, site unspecified: Secondary | ICD-10-CM | POA: Diagnosis not present

## 2017-12-01 DIAGNOSIS — E876 Hypokalemia: Secondary | ICD-10-CM | POA: Diagnosis not present

## 2017-12-01 DIAGNOSIS — R197 Diarrhea, unspecified: Secondary | ICD-10-CM | POA: Diagnosis not present

## 2017-12-01 DIAGNOSIS — R112 Nausea with vomiting, unspecified: Secondary | ICD-10-CM | POA: Diagnosis not present

## 2017-12-01 DIAGNOSIS — Z452 Encounter for adjustment and management of vascular access device: Secondary | ICD-10-CM | POA: Diagnosis not present

## 2017-12-01 DIAGNOSIS — M4624 Osteomyelitis of vertebra, thoracic region: Secondary | ICD-10-CM | POA: Diagnosis not present

## 2017-12-01 DIAGNOSIS — Z09 Encounter for follow-up examination after completed treatment for conditions other than malignant neoplasm: Secondary | ICD-10-CM | POA: Diagnosis not present

## 2017-12-01 DIAGNOSIS — Z959 Presence of cardiac and vascular implant and graft, unspecified: Secondary | ICD-10-CM | POA: Diagnosis not present

## 2017-12-01 DIAGNOSIS — Z792 Long term (current) use of antibiotics: Secondary | ICD-10-CM | POA: Diagnosis not present

## 2017-12-01 DIAGNOSIS — Z969 Presence of functional implant, unspecified: Secondary | ICD-10-CM | POA: Diagnosis not present

## 2017-12-01 IMAGING — DX DG CHEST 1V PORT
2 series · 2 of 2 positions shown · non-contrast
Comparison: None.

CLINICAL DATA: Acute onset of hypoxia.  Initial encounter.

EXAM:
PORTABLE CHEST 1 VIEW

[chest ap (1 of 2)]
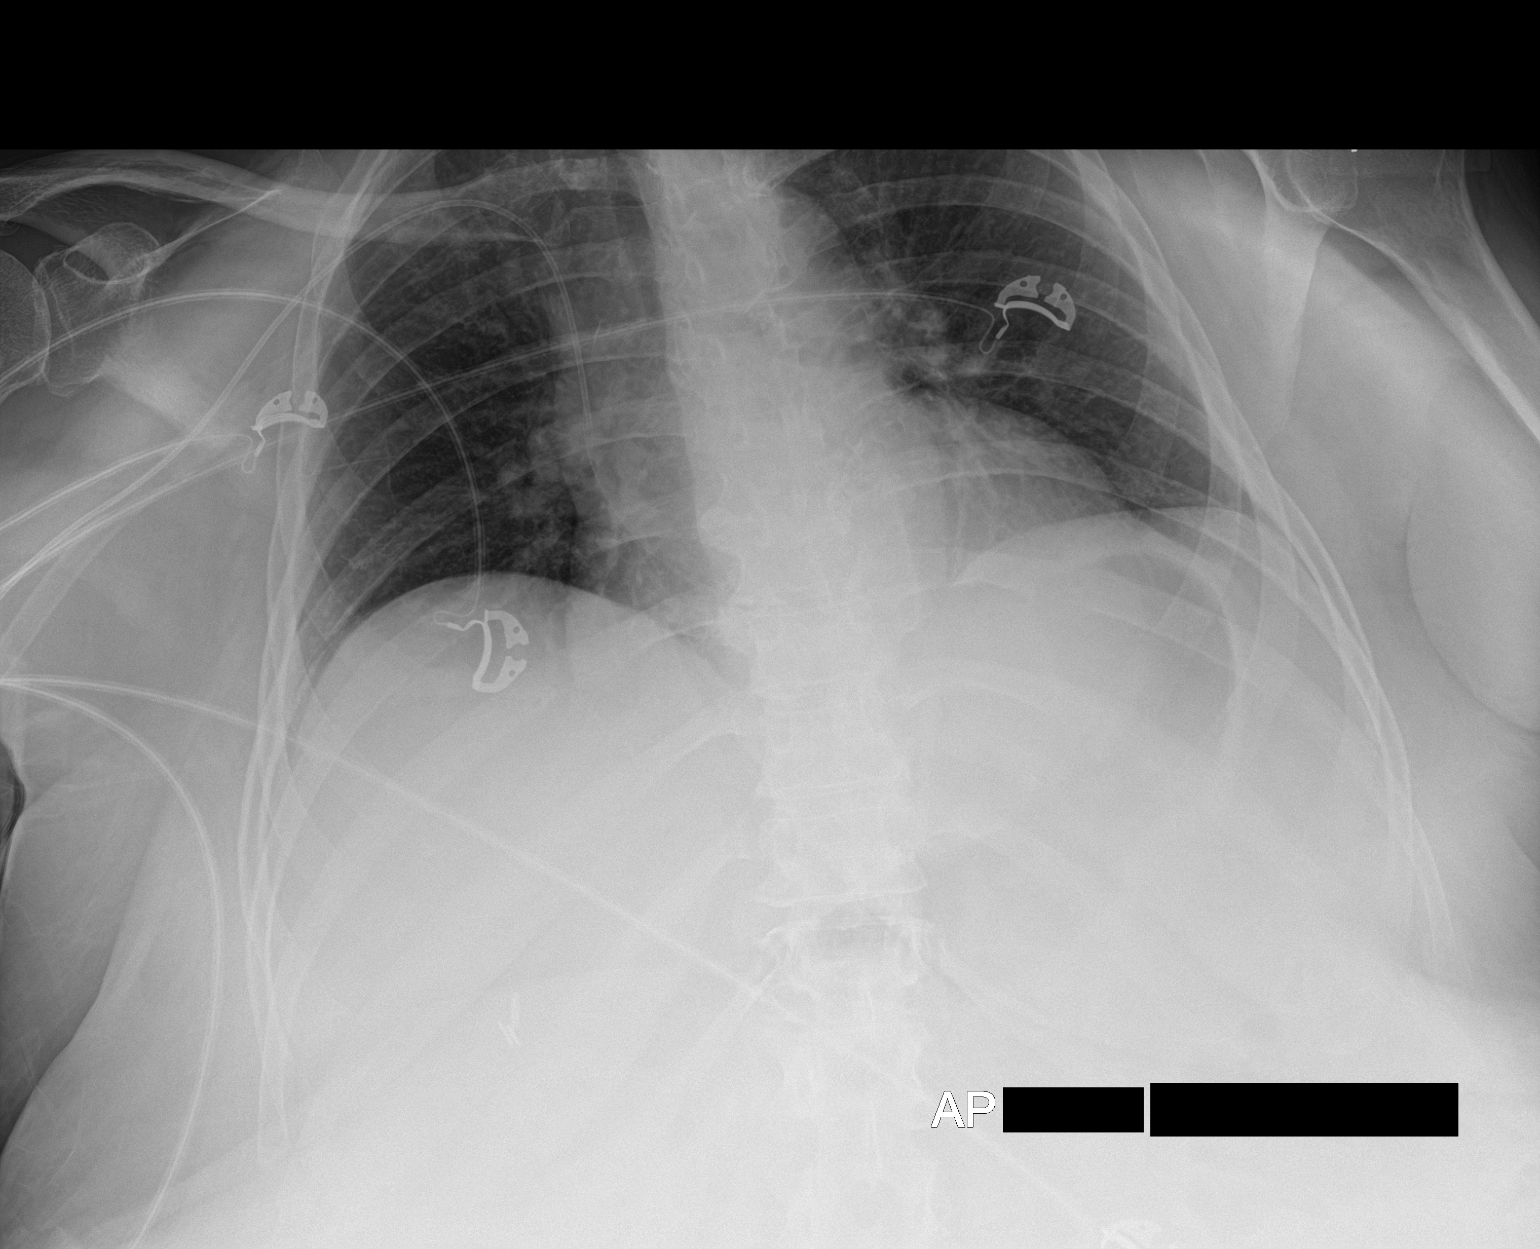

[chest ap (2 of 2)]
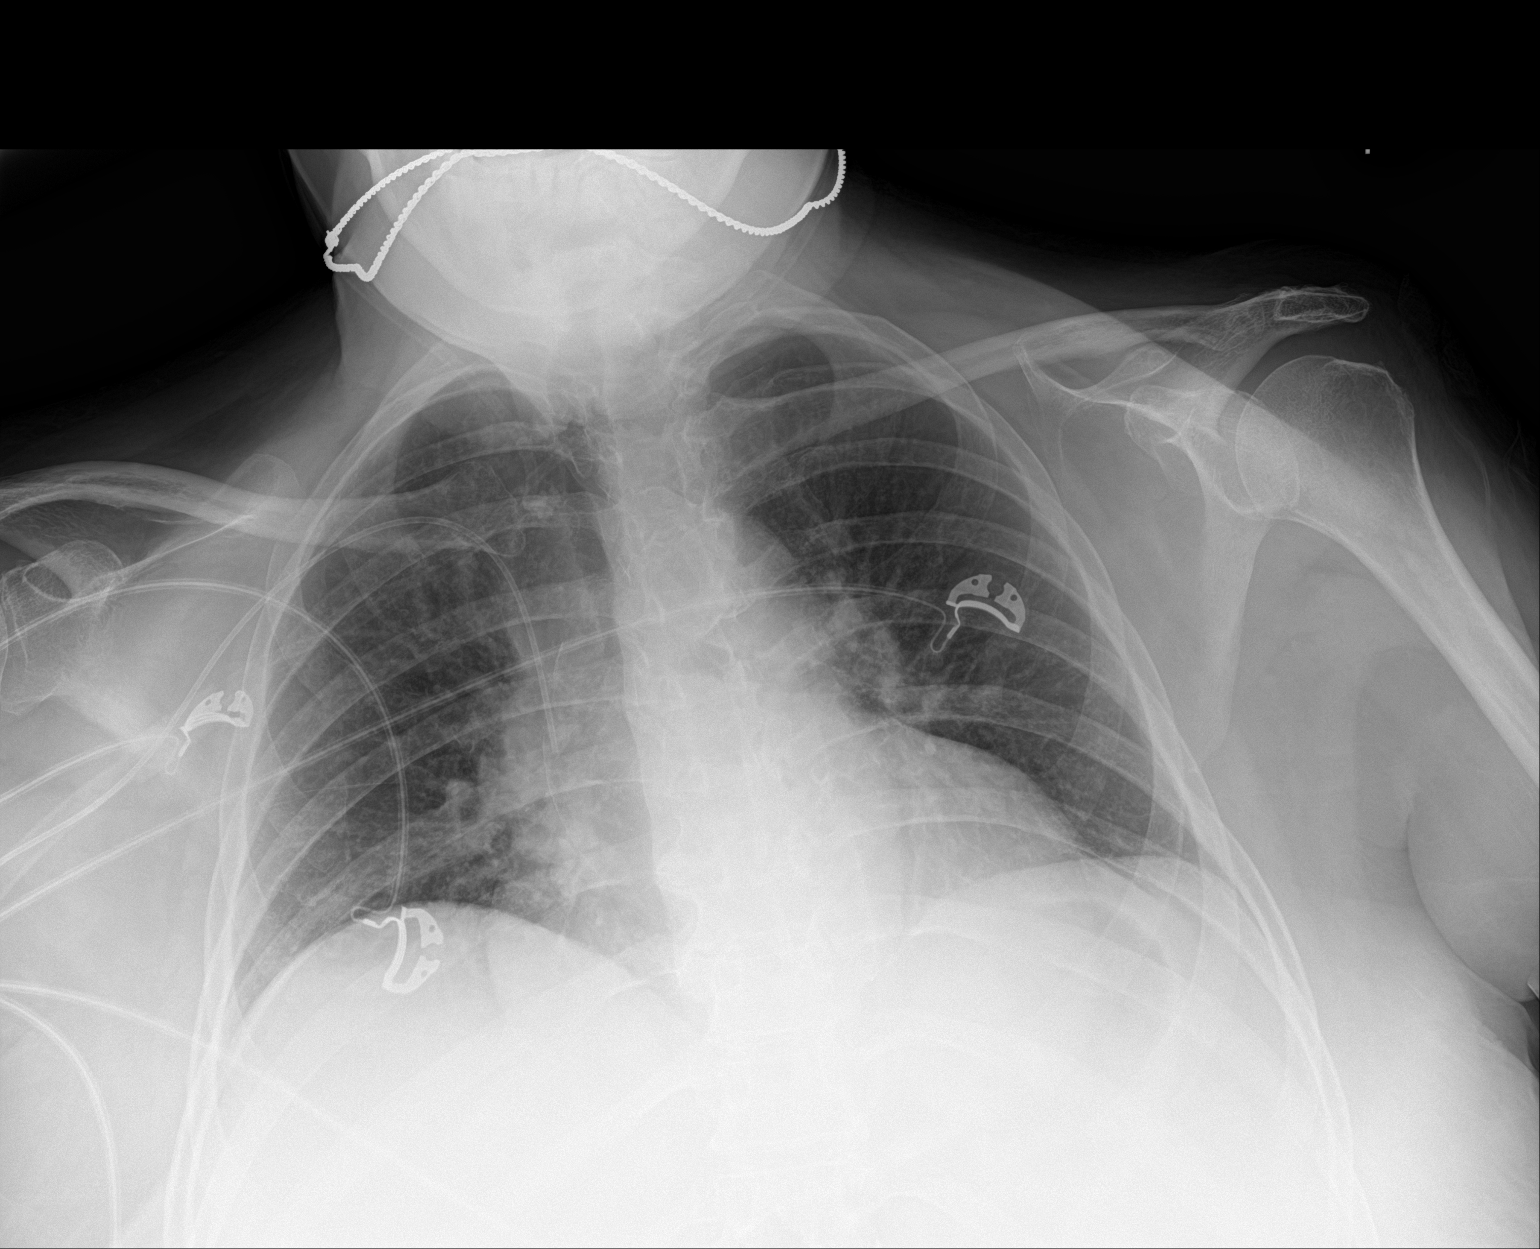

[2 of 2 positions shown; findings below may reference images not displayed]

FINDINGS: The lungs are hypoexpanded but appear grossly clear. There is no
evidence of focal opacification, pleural effusion or pneumothorax.

The cardiomediastinal silhouette is borderline enlarged. No acute
osseous abnormalities are seen.

A right PICC is noted ending about the mid SVC. Clips are noted
within the right upper quadrant, reflecting prior cholecystectomy.
IMPRESSION: Lungs hypoexpanded but grossly clear.  Borderline cardiomegaly.

## 2017-12-05 DIAGNOSIS — M4626 Osteomyelitis of vertebra, lumbar region: Secondary | ICD-10-CM | POA: Diagnosis not present

## 2017-12-06 ENCOUNTER — Other Ambulatory Visit: Payer: Self-pay

## 2017-12-06 DIAGNOSIS — Z981 Arthrodesis status: Secondary | ICD-10-CM | POA: Insufficient documentation

## 2017-12-06 HISTORY — DX: Arthrodesis status: Z98.1

## 2017-12-06 NOTE — Patient Outreach (Signed)
Transition of care:  Placed call to patient who answered the phone and reports she is feeling better. Reports she is now has PT twice a week that started last week after my call. Reports CBG range of 110-130.  Reports she is using her walker and feeling stronger. Continues to have IV antibiotics until 01/01/2018.  Reports no recent fall.  PLAN: will continue weekly transition of care calls.  Tomasa Rand, RN, BSN, CEN [facility] [employer] [phone]

## 2017-12-07 DIAGNOSIS — M4326 Fusion of spine, lumbar region: Secondary | ICD-10-CM | POA: Diagnosis not present

## 2017-12-07 DIAGNOSIS — Z981 Arthrodesis status: Secondary | ICD-10-CM | POA: Diagnosis not present

## 2017-12-07 DIAGNOSIS — M4316 Spondylolisthesis, lumbar region: Secondary | ICD-10-CM | POA: Diagnosis not present

## 2017-12-12 DIAGNOSIS — M4626 Osteomyelitis of vertebra, lumbar region: Secondary | ICD-10-CM | POA: Diagnosis not present

## 2017-12-13 ENCOUNTER — Other Ambulatory Visit: Payer: Self-pay

## 2017-12-13 DIAGNOSIS — J9601 Acute respiratory failure with hypoxia: Secondary | ICD-10-CM | POA: Diagnosis not present

## 2017-12-13 DIAGNOSIS — R410 Disorientation, unspecified: Secondary | ICD-10-CM | POA: Diagnosis not present

## 2017-12-13 DIAGNOSIS — L02818 Cutaneous abscess of other sites: Secondary | ICD-10-CM | POA: Diagnosis not present

## 2017-12-13 DIAGNOSIS — M4326 Fusion of spine, lumbar region: Secondary | ICD-10-CM | POA: Diagnosis not present

## 2017-12-13 NOTE — Patient Outreach (Signed)
Transition of care call: Placed call to patient for transition of care. No answer.  Left a message requesting a call back.  Mays Paino, RN, BSN, CEN [facility] Community Care Coordinator [phone] 

## 2017-12-14 ENCOUNTER — Other Ambulatory Visit: Payer: Self-pay

## 2017-12-14 DIAGNOSIS — Z9119 Patient's noncompliance with other medical treatment and regimen: Secondary | ICD-10-CM | POA: Diagnosis not present

## 2017-12-14 DIAGNOSIS — J45909 Unspecified asthma, uncomplicated: Secondary | ICD-10-CM | POA: Diagnosis not present

## 2017-12-14 DIAGNOSIS — M4626 Osteomyelitis of vertebra, lumbar region: Secondary | ICD-10-CM | POA: Diagnosis not present

## 2017-12-14 DIAGNOSIS — I2510 Atherosclerotic heart disease of native coronary artery without angina pectoris: Secondary | ICD-10-CM | POA: Diagnosis not present

## 2017-12-14 DIAGNOSIS — F329 Major depressive disorder, single episode, unspecified: Secondary | ICD-10-CM | POA: Diagnosis not present

## 2017-12-14 DIAGNOSIS — F419 Anxiety disorder, unspecified: Secondary | ICD-10-CM | POA: Diagnosis not present

## 2017-12-14 DIAGNOSIS — Z981 Arthrodesis status: Secondary | ICD-10-CM | POA: Diagnosis not present

## 2017-12-14 DIAGNOSIS — D649 Anemia, unspecified: Secondary | ICD-10-CM | POA: Diagnosis not present

## 2017-12-14 DIAGNOSIS — E669 Obesity, unspecified: Secondary | ICD-10-CM | POA: Diagnosis not present

## 2017-12-14 DIAGNOSIS — S32059D Unspecified fracture of fifth lumbar vertebra, subsequent encounter for fracture with routine healing: Secondary | ICD-10-CM | POA: Diagnosis not present

## 2017-12-14 DIAGNOSIS — M48062 Spinal stenosis, lumbar region with neurogenic claudication: Secondary | ICD-10-CM | POA: Diagnosis not present

## 2017-12-14 DIAGNOSIS — Z993 Dependence on wheelchair: Secondary | ICD-10-CM | POA: Diagnosis not present

## 2017-12-14 DIAGNOSIS — M4306 Spondylolysis, lumbar region: Secondary | ICD-10-CM | POA: Diagnosis not present

## 2017-12-14 DIAGNOSIS — Z452 Encounter for adjustment and management of vascular access device: Secondary | ICD-10-CM | POA: Diagnosis not present

## 2017-12-14 DIAGNOSIS — I10 Essential (primary) hypertension: Secondary | ICD-10-CM | POA: Diagnosis not present

## 2017-12-14 DIAGNOSIS — T84296D Other mechanical complication of internal fixation device of vertebrae, subsequent encounter: Secondary | ICD-10-CM | POA: Diagnosis not present

## 2017-12-14 DIAGNOSIS — E119 Type 2 diabetes mellitus without complications: Secondary | ICD-10-CM | POA: Diagnosis not present

## 2017-12-14 DIAGNOSIS — Z9181 History of falling: Secondary | ICD-10-CM | POA: Diagnosis not present

## 2017-12-14 DIAGNOSIS — Z7984 Long term (current) use of oral hypoglycemic drugs: Secondary | ICD-10-CM | POA: Diagnosis not present

## 2017-12-14 DIAGNOSIS — K219 Gastro-esophageal reflux disease without esophagitis: Secondary | ICD-10-CM | POA: Diagnosis not present

## 2017-12-14 NOTE — Patient Outreach (Signed)
Transition of care: Incoming call from patients husband who requested a call back.  Placed call to patient and spoke with husband who reports that patient is getting stronger and is feeling better. Reports that patient continues to get IV antibiotics via the PICC line. Remains active with home health nursing and PT. Husband reports that he was told that insurance would not pay for wheelchair and he did not understand. I suggested husband call insurance carrier to inquire. Directed husband to phone number on back of card.   PLAN: will continue weekly transition of care.  Tomasa Rand, RN, BSN, CEN [facility] [employer] [phone]

## 2017-12-18 IMAGING — MR MR HEAD WO/W CM
9 of 12 series · 34 of 48 positions shown · IV contrast (multihance)
Comparison: 08/23/2016

CLINICAL DATA: Encephalopathy. Recent treatment for spinal surgical
site infection

EXAM:
MRI HEAD WITHOUT AND WITH CONTRAST
TECHNIQUE: Multiplanar, multiecho pulse sequences of the brain and surrounding
structures were obtained without and with intravenous contrast.
CONTRAST:  17mL MULTIHANCE GADOBENATE DIMEGLUMINE 529 MG/ML IV SOLN

[Series 3: DWI · axial · 3.0mm · 1.09mm/px · z∈[-86,+45]mm · 9 of 90 slices shown (1 of 4)]
[im 1/90]
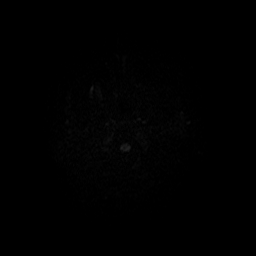
[im 12/90]
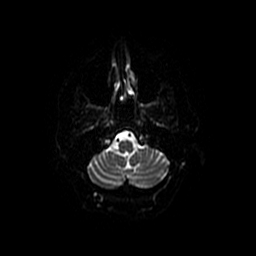
[im 23/90]
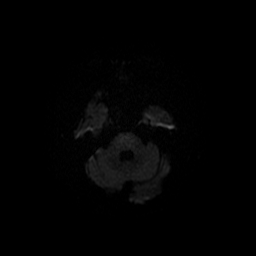
[im 34/90]
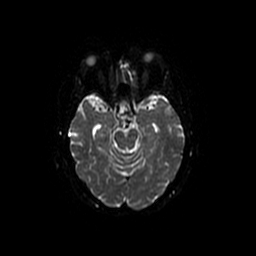
[im 45/90]
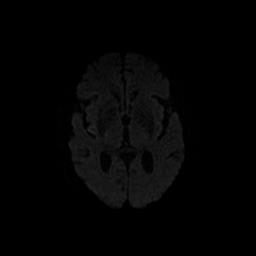
[im 56/90]
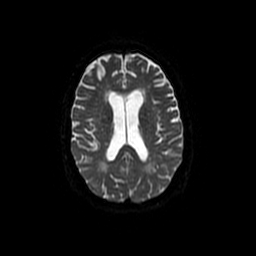
[im 67/90]
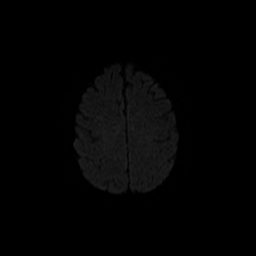
[im 78/90]
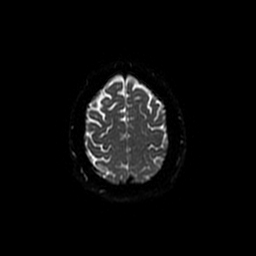
[im 90/90]
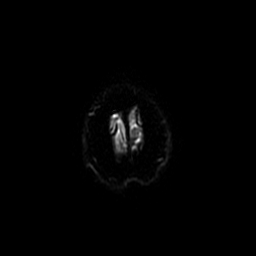

[Series 4: T2 · axial · 5.0mm · 0.47mm/px · z∈[-88,+43]mm · 2 of 23 slices shown (1 of 2)]
[im 1/23]
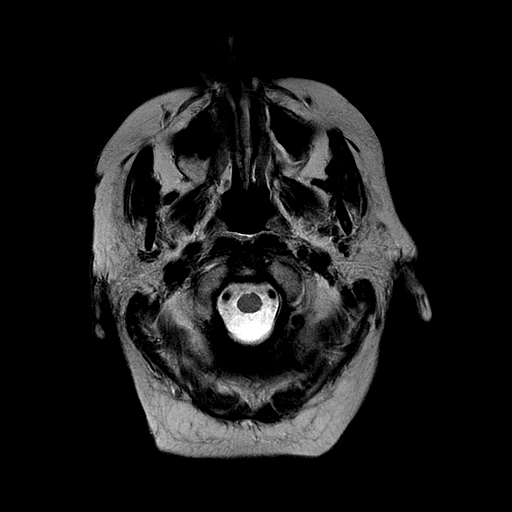
[im 23/23]
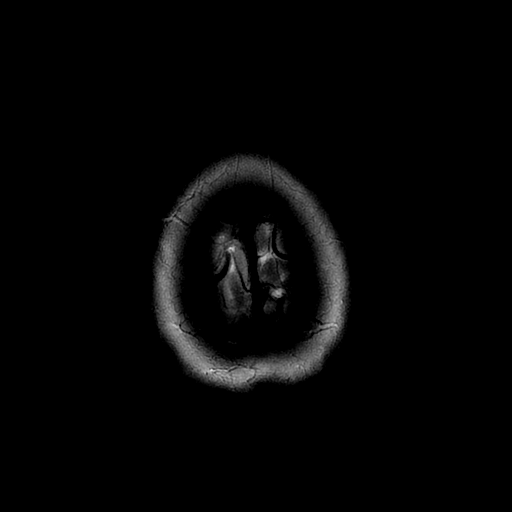

[Series 5: DWI · coronal · 5.0mm · 1.09mm/px · 6 of 70 slices shown (2 of 4)]
[im 1/70]
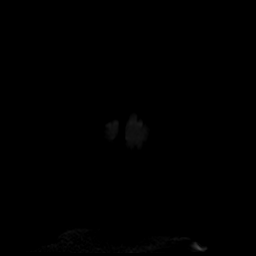
[im 14/70]
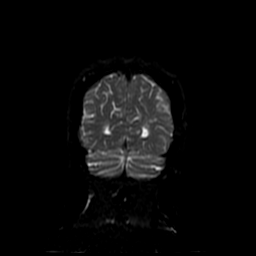
[im 28/70]
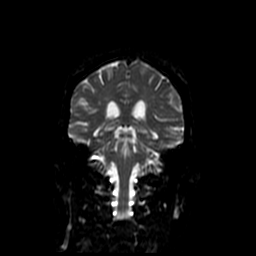
[im 42/70]
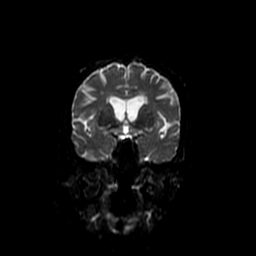
[im 56/70]
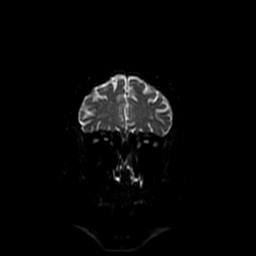
[im 70/70]
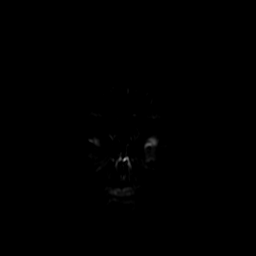

[Series 6: FLAIR · axial · 5.0mm · 0.47mm/px · z∈[-88,+43]mm · 2 of 23 slices shown]
[im 1/23]
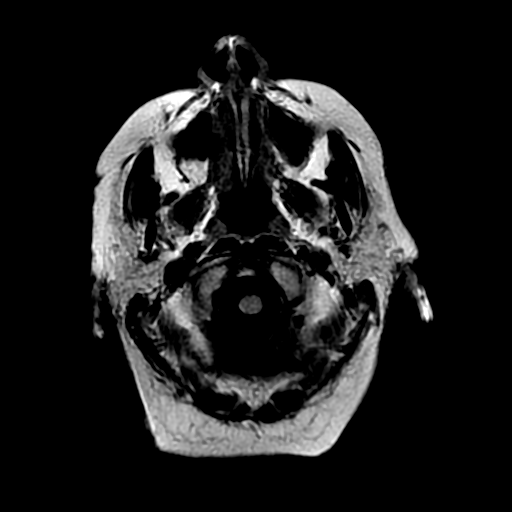
[im 23/23]
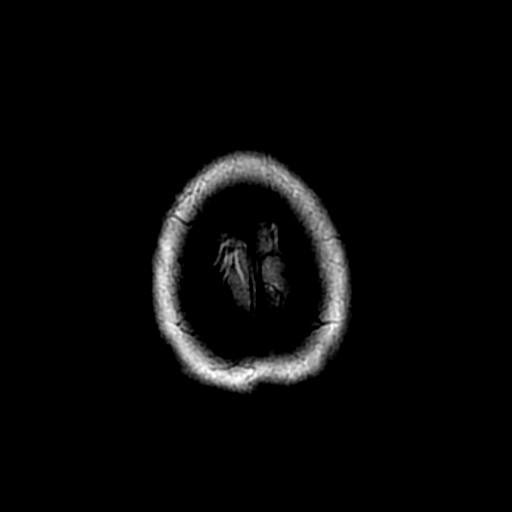

[Series 8: T1 · sagittal · 5.0mm · 0.47mm/px · 2 of 23 slices shown]
[im 1/23]
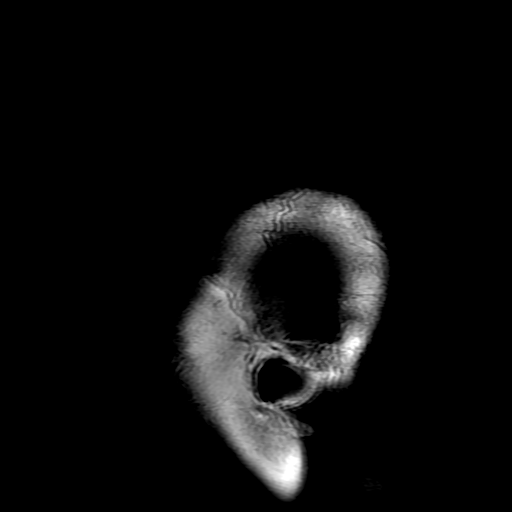
[im 23/23]
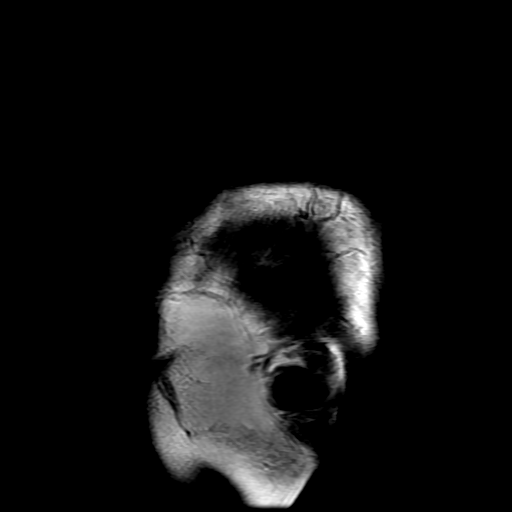

[Series 11: T2 · coronal · 5.0mm · 0.43mm/px · 3 of 30 slices shown (2 of 2)]
[im 1/30]
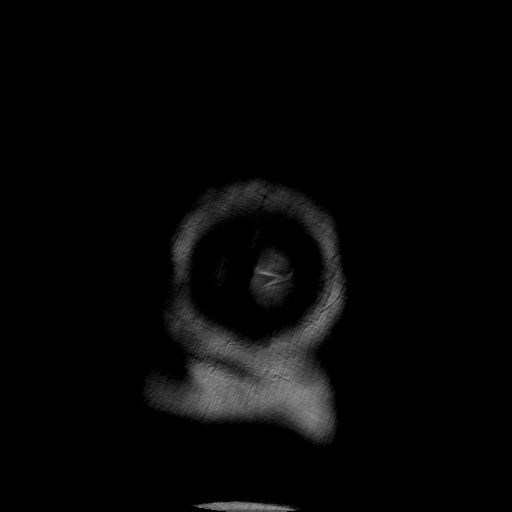
[im 15/30]
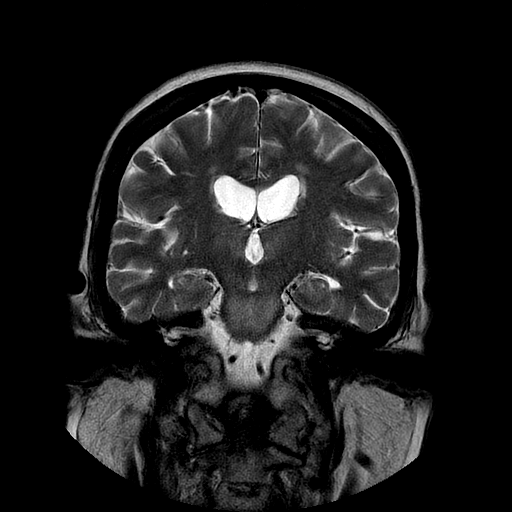
[im 30/30]
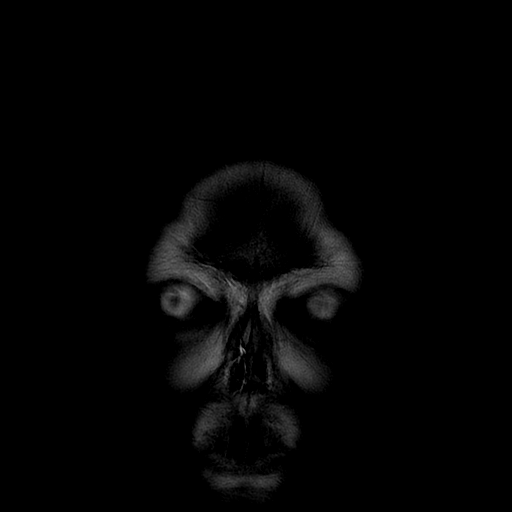

[Series 13: T1 post-contrast · coronal · 5.0mm · 0.43mm/px · 3 of 30 slices shown]
[im 1/30]
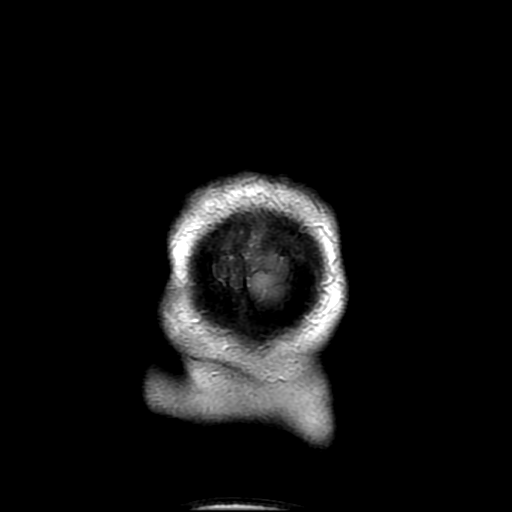
[im 15/30]
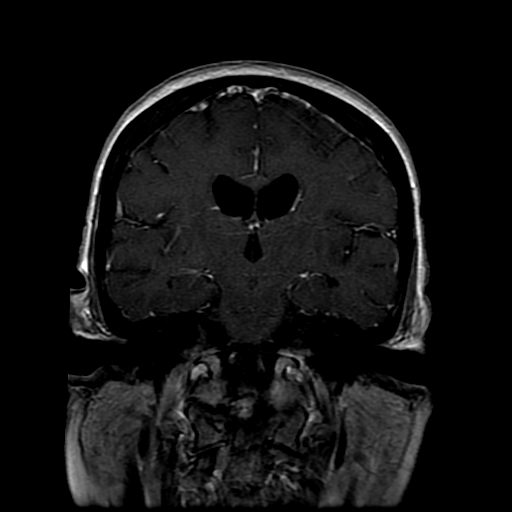
[im 30/30]
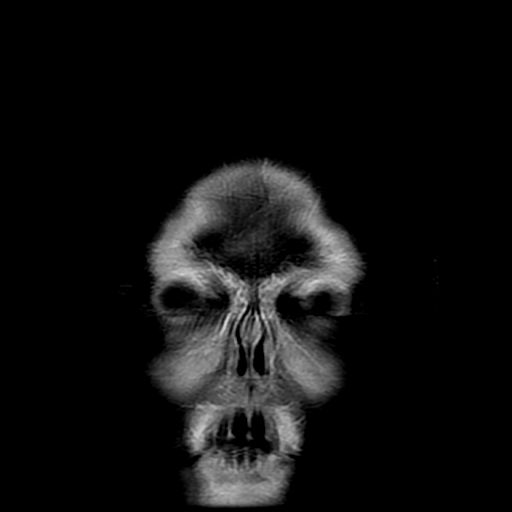

[Series 300: DWI · axial · 3.0mm · 1.09mm/px · z∈[-86,+45]mm · 4 of 45 slices shown (3 of 4)]
[im 1/45]
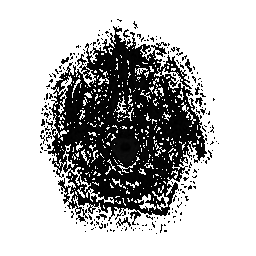
[im 15/45]
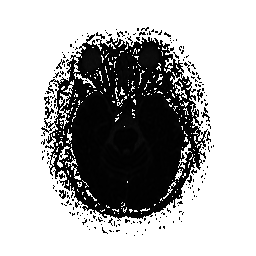
[im 30/45]
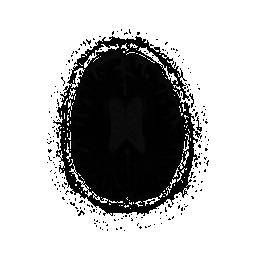
[im 45/45  full-range]
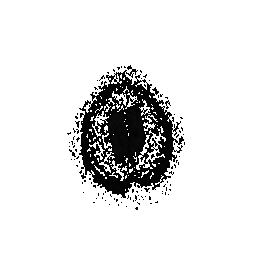

[Series 500: DWI · coronal · 5.0mm · 1.09mm/px · 3 of 35 slices shown (4 of 4)]
[im 1/35]
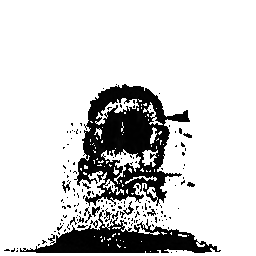
[im 18/35]
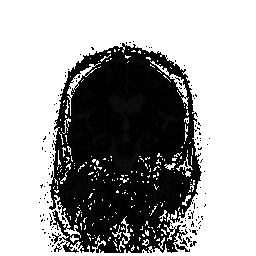
[im 35/35  full-range]
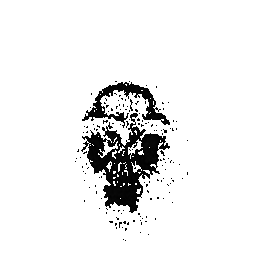

[34 of 48 positions shown; findings below may reference images not displayed]

FINDINGS: Brain: No acute infarction, hemorrhage, hydrocephalus, extra-axial
collection or mass lesion. Prominent FLAIR hyperintensity in the
pons brainstem and bilateral thalamus. No restricted diffusion or
enhancement to suggest acute demyelination. This may reflect
advanced chronic small vessel ischemia. More mild ot moderate
chronic microvascular ischemic type change seen in the cerebral
white matter. Two small remote right cerebellar infarcts. No
abnormal intracranial enhancement.

Vascular: Major flow voids are preserved.

Skull and upper cervical spine: No evidence of marrow lesion. Retro
dental ligamentous thickening

Sinuses/Orbits: Right sphenoid sinusitis with mucosal thickening and
fluid level, interval compared to head CT 5 days prior.

Other: Intermittently motion degraded, likely best obtainable in
this altered patient.
IMPRESSION: 1. No acute finding or change from 08/23/2016.
2. Unchanged prominent signal abnormality in the pons and thalami
which could be sequela of chronic small vessel ischemia or osmotic
demyelination.
3. Small-vessel type changes in the cerebral white matter are more
mild to moderate.

## 2017-12-20 ENCOUNTER — Other Ambulatory Visit: Payer: Self-pay

## 2017-12-20 DIAGNOSIS — M4626 Osteomyelitis of vertebra, lumbar region: Secondary | ICD-10-CM | POA: Diagnosis not present

## 2017-12-20 NOTE — Patient Outreach (Signed)
Transition of care/ case closure:  Placed ca;l to patient who answered and reports that she is getting stronger. Patient states that she continues to have IV antibiotics at home. Reports she is scheduled for follow up with ortho on 12/28/2017 and will finish IV antibiotics on 01/01/2018.  No recent falls and patient reports that she has completed therapy. Patient continues to do her home exercises. Patient reports that she is no longer using her hospital bed or wheelchair.  Reports husband has called to get hospital bed picked up.  Patient reports that she would like a new walker. Reviewed with patient how to get a new walker and she said she would work on it.   No new falls.  Reports todays CBG of 130.   Reviewed with patient that she has completed her goals. She denies any other needs at this time. Patient has agreed to case closure and to call me back for problems in the future.  PLAN: close case as goals are met. Will send patient and MD a letter.   Tomasa Rand, RN, BSN, CEN [facility] [employer] [phone]

## 2017-12-26 DIAGNOSIS — M4626 Osteomyelitis of vertebra, lumbar region: Secondary | ICD-10-CM | POA: Diagnosis not present

## 2017-12-28 DIAGNOSIS — B957 Other staphylococcus as the cause of diseases classified elsewhere: Secondary | ICD-10-CM | POA: Diagnosis not present

## 2017-12-28 DIAGNOSIS — I10 Essential (primary) hypertension: Secondary | ICD-10-CM | POA: Diagnosis not present

## 2017-12-28 DIAGNOSIS — E119 Type 2 diabetes mellitus without complications: Secondary | ICD-10-CM | POA: Diagnosis not present

## 2017-12-28 DIAGNOSIS — M4620 Osteomyelitis of vertebra, site unspecified: Secondary | ICD-10-CM | POA: Diagnosis not present

## 2017-12-28 DIAGNOSIS — M4626 Osteomyelitis of vertebra, lumbar region: Secondary | ICD-10-CM | POA: Diagnosis not present

## 2017-12-28 DIAGNOSIS — M48061 Spinal stenosis, lumbar region without neurogenic claudication: Secondary | ICD-10-CM | POA: Diagnosis not present

## 2018-01-11 DIAGNOSIS — Z981 Arthrodesis status: Secondary | ICD-10-CM | POA: Diagnosis not present

## 2018-02-01 DIAGNOSIS — Z5181 Encounter for therapeutic drug level monitoring: Secondary | ICD-10-CM | POA: Diagnosis not present

## 2018-02-01 DIAGNOSIS — Z79899 Other long term (current) drug therapy: Secondary | ICD-10-CM | POA: Diagnosis not present

## 2018-02-01 DIAGNOSIS — G894 Chronic pain syndrome: Secondary | ICD-10-CM | POA: Diagnosis not present

## 2018-02-01 DIAGNOSIS — Z23 Encounter for immunization: Secondary | ICD-10-CM | POA: Diagnosis not present

## 2018-02-08 DIAGNOSIS — Z79899 Other long term (current) drug therapy: Secondary | ICD-10-CM | POA: Diagnosis not present

## 2018-02-08 DIAGNOSIS — M4620 Osteomyelitis of vertebra, site unspecified: Secondary | ICD-10-CM | POA: Diagnosis not present

## 2018-02-08 DIAGNOSIS — E119 Type 2 diabetes mellitus without complications: Secondary | ICD-10-CM | POA: Diagnosis not present

## 2018-02-08 DIAGNOSIS — M4625 Osteomyelitis of vertebra, thoracolumbar region: Secondary | ICD-10-CM | POA: Diagnosis not present

## 2018-02-08 DIAGNOSIS — M48061 Spinal stenosis, lumbar region without neurogenic claudication: Secondary | ICD-10-CM | POA: Diagnosis not present

## 2018-02-08 DIAGNOSIS — I10 Essential (primary) hypertension: Secondary | ICD-10-CM | POA: Diagnosis not present

## 2018-04-26 DIAGNOSIS — E785 Hyperlipidemia, unspecified: Secondary | ICD-10-CM | POA: Diagnosis not present

## 2018-04-26 DIAGNOSIS — E119 Type 2 diabetes mellitus without complications: Secondary | ICD-10-CM | POA: Diagnosis not present

## 2018-04-26 DIAGNOSIS — M4625 Osteomyelitis of vertebra, thoracolumbar region: Secondary | ICD-10-CM | POA: Diagnosis not present

## 2018-04-26 DIAGNOSIS — I10 Essential (primary) hypertension: Secondary | ICD-10-CM | POA: Diagnosis not present

## 2018-05-05 DIAGNOSIS — Z0000 Encounter for general adult medical examination without abnormal findings: Secondary | ICD-10-CM | POA: Diagnosis not present

## 2018-05-05 DIAGNOSIS — Z1389 Encounter for screening for other disorder: Secondary | ICD-10-CM | POA: Diagnosis not present

## 2018-05-05 DIAGNOSIS — M00862 Arthritis due to other bacteria, left knee: Secondary | ICD-10-CM | POA: Diagnosis not present

## 2018-05-05 DIAGNOSIS — E1140 Type 2 diabetes mellitus with diabetic neuropathy, unspecified: Secondary | ICD-10-CM | POA: Diagnosis not present

## 2018-05-05 DIAGNOSIS — E7800 Pure hypercholesterolemia, unspecified: Secondary | ICD-10-CM | POA: Diagnosis not present

## 2018-05-05 DIAGNOSIS — J4520 Mild intermittent asthma, uncomplicated: Secondary | ICD-10-CM | POA: Diagnosis not present

## 2018-05-05 DIAGNOSIS — Z96652 Presence of left artificial knee joint: Secondary | ICD-10-CM | POA: Diagnosis not present

## 2018-05-05 DIAGNOSIS — Z1331 Encounter for screening for depression: Secondary | ICD-10-CM | POA: Diagnosis not present

## 2018-05-05 DIAGNOSIS — I10 Essential (primary) hypertension: Secondary | ICD-10-CM | POA: Diagnosis not present

## 2018-05-05 DIAGNOSIS — E119 Type 2 diabetes mellitus without complications: Secondary | ICD-10-CM | POA: Diagnosis not present

## 2018-05-05 DIAGNOSIS — M25562 Pain in left knee: Secondary | ICD-10-CM | POA: Diagnosis not present

## 2018-05-05 DIAGNOSIS — Z96653 Presence of artificial knee joint, bilateral: Secondary | ICD-10-CM | POA: Diagnosis not present

## 2018-05-08 DIAGNOSIS — E119 Type 2 diabetes mellitus without complications: Secondary | ICD-10-CM | POA: Diagnosis not present

## 2018-05-12 DIAGNOSIS — T847XXD Infection and inflammatory reaction due to other internal orthopedic prosthetic devices, implants and grafts, subsequent encounter: Secondary | ICD-10-CM

## 2018-05-12 DIAGNOSIS — Z96652 Presence of left artificial knee joint: Secondary | ICD-10-CM

## 2018-05-12 HISTORY — DX: Presence of left artificial knee joint: Z96.652

## 2018-05-12 HISTORY — DX: Infection and inflammatory reaction due to other internal orthopedic prosthetic devices, implants and grafts, subsequent encounter: T84.7XXD

## 2018-05-26 DIAGNOSIS — Z96652 Presence of left artificial knee joint: Secondary | ICD-10-CM | POA: Diagnosis not present

## 2018-05-26 DIAGNOSIS — T847XXD Infection and inflammatory reaction due to other internal orthopedic prosthetic devices, implants and grafts, subsequent encounter: Secondary | ICD-10-CM | POA: Diagnosis not present

## 2018-06-07 DIAGNOSIS — T8450XA Infection and inflammatory reaction due to unspecified internal joint prosthesis, initial encounter: Secondary | ICD-10-CM | POA: Diagnosis not present

## 2018-06-07 DIAGNOSIS — B958 Unspecified staphylococcus as the cause of diseases classified elsewhere: Secondary | ICD-10-CM | POA: Diagnosis not present

## 2018-06-07 DIAGNOSIS — E1142 Type 2 diabetes mellitus with diabetic polyneuropathy: Secondary | ICD-10-CM | POA: Diagnosis not present

## 2018-06-07 DIAGNOSIS — Z969 Presence of functional implant, unspecified: Secondary | ICD-10-CM | POA: Diagnosis not present

## 2018-06-07 DIAGNOSIS — T8450XD Infection and inflammatory reaction due to unspecified internal joint prosthesis, subsequent encounter: Secondary | ICD-10-CM | POA: Diagnosis not present

## 2018-06-20 DIAGNOSIS — R52 Pain, unspecified: Secondary | ICD-10-CM | POA: Diagnosis not present

## 2018-06-20 DIAGNOSIS — T148XXA Other injury of unspecified body region, initial encounter: Secondary | ICD-10-CM | POA: Diagnosis not present

## 2018-06-20 DIAGNOSIS — S0083XA Contusion of other part of head, initial encounter: Secondary | ICD-10-CM | POA: Diagnosis not present

## 2018-06-20 DIAGNOSIS — S0990XA Unspecified injury of head, initial encounter: Secondary | ICD-10-CM | POA: Diagnosis not present

## 2018-06-20 DIAGNOSIS — I6782 Cerebral ischemia: Secondary | ICD-10-CM | POA: Diagnosis not present

## 2018-06-20 DIAGNOSIS — M25511 Pain in right shoulder: Secondary | ICD-10-CM | POA: Diagnosis not present

## 2018-06-20 DIAGNOSIS — S299XXA Unspecified injury of thorax, initial encounter: Secondary | ICD-10-CM | POA: Diagnosis not present

## 2018-06-20 DIAGNOSIS — S199XXA Unspecified injury of neck, initial encounter: Secondary | ICD-10-CM | POA: Diagnosis not present

## 2018-06-20 DIAGNOSIS — S20211A Contusion of right front wall of thorax, initial encounter: Secondary | ICD-10-CM | POA: Diagnosis not present

## 2018-06-20 DIAGNOSIS — W19XXXA Unspecified fall, initial encounter: Secondary | ICD-10-CM | POA: Diagnosis not present

## 2018-06-20 DIAGNOSIS — E041 Nontoxic single thyroid nodule: Secondary | ICD-10-CM | POA: Diagnosis not present

## 2018-06-20 DIAGNOSIS — S4991XA Unspecified injury of right shoulder and upper arm, initial encounter: Secondary | ICD-10-CM | POA: Diagnosis not present

## 2018-06-23 ENCOUNTER — Other Ambulatory Visit: Payer: Self-pay

## 2018-06-23 NOTE — Patient Outreach (Signed)
[name]) Care Management  06/23/2018  Jeanette [name] February 01, 1941 [id number]   Telephone Screen  Referral Date: 06/23/2018 Referral Source: [facility] Dept. Referral Reason: "member has to take doxycycline "for the rest of her life" due to previous infection from surgery, would like co-pay assistance" Insurance: [employer]   Outreach attempt #1 to patient. Spoke with patient. Discussed and reviewed referral source and reason with patient. She reports that she is being followed by infectious disease for a infection that she got into her "blood and back." She is taking antibiotic long term. She reports that she has been set up already with good Rx and is getting med for $26.00/month. She reports without prescription assistance med would be costing her about $200/month. She states that she has been approved until the end of the year when she will have to reapply. Patient states she takes about six other meds and they all have low co-pays. She denies needing [place] assistance as she is already getting help for med. Patient lives with spouse. She reports she is independent with ADLs/IADLs. She drives herself to appts or spouse takes her. She voices that she is able to manage her chronic conditions. She denies any RN needs at this time. Patient inquiring about co pay assistance to MD appts and hospital bills. Advised patient that [facility] does not assist with those matters. No [facility] needs identified this call.     Plan: RN CM will close case at this time.    Enzo Montgomery, RN,BSN,CCM [facility] Telephonic Care Management Coordinator Direct Phone: [phone] Toll Free: [phone] Fax: [phone]

## 2018-08-07 DIAGNOSIS — I10 Essential (primary) hypertension: Secondary | ICD-10-CM | POA: Diagnosis not present

## 2018-08-07 DIAGNOSIS — G894 Chronic pain syndrome: Secondary | ICD-10-CM | POA: Diagnosis not present

## 2018-08-07 DIAGNOSIS — M8600 Acute hematogenous osteomyelitis, unspecified site: Secondary | ICD-10-CM | POA: Diagnosis not present

## 2018-08-07 DIAGNOSIS — Z5181 Encounter for therapeutic drug level monitoring: Secondary | ICD-10-CM | POA: Diagnosis not present

## 2018-08-07 DIAGNOSIS — Z79899 Other long term (current) drug therapy: Secondary | ICD-10-CM | POA: Diagnosis not present

## 2018-08-09 DIAGNOSIS — E1142 Type 2 diabetes mellitus with diabetic polyneuropathy: Secondary | ICD-10-CM | POA: Diagnosis not present

## 2018-08-09 DIAGNOSIS — Z792 Long term (current) use of antibiotics: Secondary | ICD-10-CM | POA: Diagnosis not present

## 2018-08-09 DIAGNOSIS — B958 Unspecified staphylococcus as the cause of diseases classified elsewhere: Secondary | ICD-10-CM | POA: Diagnosis not present

## 2018-08-09 DIAGNOSIS — Z969 Presence of functional implant, unspecified: Secondary | ICD-10-CM | POA: Diagnosis not present

## 2018-08-09 DIAGNOSIS — T8450XD Infection and inflammatory reaction due to unspecified internal joint prosthesis, subsequent encounter: Secondary | ICD-10-CM | POA: Diagnosis not present

## 2018-09-15 DIAGNOSIS — R531 Weakness: Secondary | ICD-10-CM | POA: Diagnosis not present

## 2018-09-25 DIAGNOSIS — G894 Chronic pain syndrome: Secondary | ICD-10-CM | POA: Diagnosis not present

## 2018-10-05 DIAGNOSIS — B958 Unspecified staphylococcus as the cause of diseases classified elsewhere: Secondary | ICD-10-CM | POA: Diagnosis not present

## 2018-10-05 DIAGNOSIS — T8450XD Infection and inflammatory reaction due to unspecified internal joint prosthesis, subsequent encounter: Secondary | ICD-10-CM | POA: Diagnosis not present

## 2018-10-05 DIAGNOSIS — R197 Diarrhea, unspecified: Secondary | ICD-10-CM | POA: Diagnosis not present

## 2018-10-05 DIAGNOSIS — E1142 Type 2 diabetes mellitus with diabetic polyneuropathy: Secondary | ICD-10-CM | POA: Diagnosis not present

## 2018-10-05 DIAGNOSIS — Z969 Presence of functional implant, unspecified: Secondary | ICD-10-CM | POA: Diagnosis not present

## 2018-10-05 DIAGNOSIS — Z792 Long term (current) use of antibiotics: Secondary | ICD-10-CM | POA: Diagnosis not present

## 2018-11-15 DIAGNOSIS — T8450XD Infection and inflammatory reaction due to unspecified internal joint prosthesis, subsequent encounter: Secondary | ICD-10-CM | POA: Diagnosis not present

## 2018-11-15 DIAGNOSIS — Z792 Long term (current) use of antibiotics: Secondary | ICD-10-CM | POA: Diagnosis not present

## 2018-11-15 DIAGNOSIS — Z969 Presence of functional implant, unspecified: Secondary | ICD-10-CM | POA: Diagnosis not present

## 2018-11-15 DIAGNOSIS — E1142 Type 2 diabetes mellitus with diabetic polyneuropathy: Secondary | ICD-10-CM | POA: Diagnosis not present

## 2018-11-15 DIAGNOSIS — B958 Unspecified staphylococcus as the cause of diseases classified elsewhere: Secondary | ICD-10-CM | POA: Diagnosis not present

## 2018-12-27 DIAGNOSIS — I10 Essential (primary) hypertension: Secondary | ICD-10-CM | POA: Diagnosis not present

## 2018-12-27 DIAGNOSIS — G894 Chronic pain syndrome: Secondary | ICD-10-CM | POA: Diagnosis not present

## 2018-12-27 DIAGNOSIS — E119 Type 2 diabetes mellitus without complications: Secondary | ICD-10-CM | POA: Diagnosis not present

## 2018-12-27 DIAGNOSIS — M869 Osteomyelitis, unspecified: Secondary | ICD-10-CM | POA: Diagnosis not present

## 2018-12-27 DIAGNOSIS — Z23 Encounter for immunization: Secondary | ICD-10-CM | POA: Diagnosis not present

## 2019-04-04 DIAGNOSIS — G894 Chronic pain syndrome: Secondary | ICD-10-CM | POA: Diagnosis not present

## 2019-04-04 DIAGNOSIS — E119 Type 2 diabetes mellitus without complications: Secondary | ICD-10-CM | POA: Diagnosis not present

## 2019-04-04 DIAGNOSIS — I10 Essential (primary) hypertension: Secondary | ICD-10-CM | POA: Diagnosis not present

## 2019-07-04 DIAGNOSIS — E1140 Type 2 diabetes mellitus with diabetic neuropathy, unspecified: Secondary | ICD-10-CM | POA: Diagnosis not present

## 2019-07-04 DIAGNOSIS — E119 Type 2 diabetes mellitus without complications: Secondary | ICD-10-CM | POA: Diagnosis not present

## 2019-07-04 DIAGNOSIS — G894 Chronic pain syndrome: Secondary | ICD-10-CM | POA: Diagnosis not present

## 2019-07-04 DIAGNOSIS — F331 Major depressive disorder, recurrent, moderate: Secondary | ICD-10-CM | POA: Diagnosis not present

## 2019-07-20 DIAGNOSIS — I10 Essential (primary) hypertension: Secondary | ICD-10-CM | POA: Diagnosis not present

## 2019-07-20 DIAGNOSIS — E1169 Type 2 diabetes mellitus with other specified complication: Secondary | ICD-10-CM | POA: Diagnosis not present

## 2019-07-20 DIAGNOSIS — F419 Anxiety disorder, unspecified: Secondary | ICD-10-CM | POA: Diagnosis not present

## 2019-07-20 DIAGNOSIS — D692 Other nonthrombocytopenic purpura: Secondary | ICD-10-CM | POA: Diagnosis not present

## 2019-07-20 DIAGNOSIS — M86162 Other acute osteomyelitis, left tibia and fibula: Secondary | ICD-10-CM | POA: Diagnosis not present

## 2019-07-20 DIAGNOSIS — E785 Hyperlipidemia, unspecified: Secondary | ICD-10-CM | POA: Diagnosis not present

## 2019-07-20 DIAGNOSIS — F3342 Major depressive disorder, recurrent, in full remission: Secondary | ICD-10-CM | POA: Diagnosis not present

## 2019-07-20 DIAGNOSIS — E1140 Type 2 diabetes mellitus with diabetic neuropathy, unspecified: Secondary | ICD-10-CM | POA: Diagnosis not present

## 2019-08-10 DIAGNOSIS — M25561 Pain in right knee: Secondary | ICD-10-CM | POA: Diagnosis not present

## 2019-08-14 DIAGNOSIS — S8000XA Contusion of unspecified knee, initial encounter: Secondary | ICD-10-CM | POA: Diagnosis not present

## 2019-08-21 DIAGNOSIS — M25561 Pain in right knee: Secondary | ICD-10-CM | POA: Diagnosis not present

## 2019-08-21 DIAGNOSIS — M6281 Muscle weakness (generalized): Secondary | ICD-10-CM | POA: Diagnosis not present

## 2019-08-21 DIAGNOSIS — M25661 Stiffness of right knee, not elsewhere classified: Secondary | ICD-10-CM | POA: Diagnosis not present

## 2019-08-30 DIAGNOSIS — M6281 Muscle weakness (generalized): Secondary | ICD-10-CM | POA: Diagnosis not present

## 2019-08-30 DIAGNOSIS — M25561 Pain in right knee: Secondary | ICD-10-CM | POA: Diagnosis not present

## 2019-08-30 DIAGNOSIS — M25661 Stiffness of right knee, not elsewhere classified: Secondary | ICD-10-CM | POA: Diagnosis not present

## 2019-09-06 DIAGNOSIS — M25561 Pain in right knee: Secondary | ICD-10-CM | POA: Diagnosis not present

## 2019-09-06 DIAGNOSIS — M6281 Muscle weakness (generalized): Secondary | ICD-10-CM | POA: Diagnosis not present

## 2019-09-06 DIAGNOSIS — M25661 Stiffness of right knee, not elsewhere classified: Secondary | ICD-10-CM | POA: Diagnosis not present

## 2019-09-14 DIAGNOSIS — M6281 Muscle weakness (generalized): Secondary | ICD-10-CM | POA: Diagnosis not present

## 2019-09-14 DIAGNOSIS — M25561 Pain in right knee: Secondary | ICD-10-CM | POA: Diagnosis not present

## 2019-09-14 DIAGNOSIS — M25661 Stiffness of right knee, not elsewhere classified: Secondary | ICD-10-CM | POA: Diagnosis not present

## 2019-09-21 DIAGNOSIS — M6281 Muscle weakness (generalized): Secondary | ICD-10-CM | POA: Diagnosis not present

## 2019-09-21 DIAGNOSIS — M25661 Stiffness of right knee, not elsewhere classified: Secondary | ICD-10-CM | POA: Diagnosis not present

## 2019-09-21 DIAGNOSIS — M25561 Pain in right knee: Secondary | ICD-10-CM | POA: Diagnosis not present

## 2019-09-27 DIAGNOSIS — M25661 Stiffness of right knee, not elsewhere classified: Secondary | ICD-10-CM | POA: Diagnosis not present

## 2019-09-27 DIAGNOSIS — M25561 Pain in right knee: Secondary | ICD-10-CM | POA: Diagnosis not present

## 2019-09-27 DIAGNOSIS — M6281 Muscle weakness (generalized): Secondary | ICD-10-CM | POA: Diagnosis not present

## 2019-10-11 DIAGNOSIS — G894 Chronic pain syndrome: Secondary | ICD-10-CM | POA: Diagnosis not present

## 2019-10-11 DIAGNOSIS — F331 Major depressive disorder, recurrent, moderate: Secondary | ICD-10-CM | POA: Diagnosis not present

## 2019-10-11 DIAGNOSIS — E119 Type 2 diabetes mellitus without complications: Secondary | ICD-10-CM | POA: Diagnosis not present

## 2019-11-14 DIAGNOSIS — E1142 Type 2 diabetes mellitus with diabetic polyneuropathy: Secondary | ICD-10-CM | POA: Diagnosis not present

## 2019-11-14 DIAGNOSIS — T8450XD Infection and inflammatory reaction due to unspecified internal joint prosthesis, subsequent encounter: Secondary | ICD-10-CM | POA: Diagnosis not present

## 2019-11-14 DIAGNOSIS — B958 Unspecified staphylococcus as the cause of diseases classified elsewhere: Secondary | ICD-10-CM | POA: Diagnosis not present

## 2019-11-14 DIAGNOSIS — Z792 Long term (current) use of antibiotics: Secondary | ICD-10-CM | POA: Diagnosis not present

## 2019-11-14 DIAGNOSIS — Z969 Presence of functional implant, unspecified: Secondary | ICD-10-CM | POA: Diagnosis not present

## 2019-12-12 DIAGNOSIS — E669 Obesity, unspecified: Secondary | ICD-10-CM | POA: Diagnosis not present

## 2019-12-12 DIAGNOSIS — I10 Essential (primary) hypertension: Secondary | ICD-10-CM | POA: Diagnosis not present

## 2019-12-12 DIAGNOSIS — E7800 Pure hypercholesterolemia, unspecified: Secondary | ICD-10-CM | POA: Diagnosis not present

## 2019-12-12 DIAGNOSIS — J4520 Mild intermittent asthma, uncomplicated: Secondary | ICD-10-CM | POA: Diagnosis not present

## 2019-12-12 DIAGNOSIS — E1140 Type 2 diabetes mellitus with diabetic neuropathy, unspecified: Secondary | ICD-10-CM | POA: Diagnosis not present

## 2019-12-12 DIAGNOSIS — Z1389 Encounter for screening for other disorder: Secondary | ICD-10-CM | POA: Diagnosis not present

## 2019-12-12 DIAGNOSIS — Z0000 Encounter for general adult medical examination without abnormal findings: Secondary | ICD-10-CM | POA: Diagnosis not present

## 2019-12-25 DIAGNOSIS — D649 Anemia, unspecified: Secondary | ICD-10-CM | POA: Diagnosis not present

## 2020-01-01 DIAGNOSIS — R410 Disorientation, unspecified: Secondary | ICD-10-CM | POA: Diagnosis not present

## 2020-01-01 DIAGNOSIS — R197 Diarrhea, unspecified: Secondary | ICD-10-CM | POA: Diagnosis not present

## 2020-01-01 DIAGNOSIS — R7981 Abnormal blood-gas level: Secondary | ICD-10-CM | POA: Diagnosis not present

## 2020-01-08 DIAGNOSIS — I10 Essential (primary) hypertension: Secondary | ICD-10-CM | POA: Diagnosis not present

## 2020-01-08 DIAGNOSIS — N3281 Overactive bladder: Secondary | ICD-10-CM | POA: Diagnosis not present

## 2020-01-08 DIAGNOSIS — D649 Anemia, unspecified: Secondary | ICD-10-CM | POA: Diagnosis not present

## 2020-01-08 DIAGNOSIS — Z1211 Encounter for screening for malignant neoplasm of colon: Secondary | ICD-10-CM | POA: Diagnosis not present

## 2020-01-08 DIAGNOSIS — R410 Disorientation, unspecified: Secondary | ICD-10-CM | POA: Diagnosis not present

## 2020-01-28 DIAGNOSIS — R197 Diarrhea, unspecified: Secondary | ICD-10-CM | POA: Diagnosis not present

## 2020-01-28 DIAGNOSIS — R110 Nausea: Secondary | ICD-10-CM | POA: Diagnosis not present

## 2020-01-28 DIAGNOSIS — Z1152 Encounter for screening for COVID-19: Secondary | ICD-10-CM | POA: Diagnosis not present

## 2020-02-16 DIAGNOSIS — R300 Dysuria: Secondary | ICD-10-CM | POA: Diagnosis not present

## 2020-02-16 DIAGNOSIS — Z1152 Encounter for screening for COVID-19: Secondary | ICD-10-CM | POA: Diagnosis not present

## 2020-02-16 DIAGNOSIS — R112 Nausea with vomiting, unspecified: Secondary | ICD-10-CM | POA: Diagnosis not present

## 2020-02-16 DIAGNOSIS — Z20822 Contact with and (suspected) exposure to covid-19: Secondary | ICD-10-CM | POA: Diagnosis not present

## 2020-02-16 DIAGNOSIS — R1084 Generalized abdominal pain: Secondary | ICD-10-CM | POA: Diagnosis not present

## 2020-02-16 DIAGNOSIS — R197 Diarrhea, unspecified: Secondary | ICD-10-CM | POA: Diagnosis not present

## 2020-02-17 DIAGNOSIS — Z79899 Other long term (current) drug therapy: Secondary | ICD-10-CM | POA: Diagnosis not present

## 2020-02-17 DIAGNOSIS — R52 Pain, unspecified: Secondary | ICD-10-CM | POA: Diagnosis not present

## 2020-02-17 DIAGNOSIS — R0902 Hypoxemia: Secondary | ICD-10-CM | POA: Diagnosis not present

## 2020-02-17 DIAGNOSIS — R0602 Shortness of breath: Secondary | ICD-10-CM | POA: Diagnosis not present

## 2020-02-17 DIAGNOSIS — F32A Depression, unspecified: Secondary | ICD-10-CM | POA: Diagnosis not present

## 2020-02-17 DIAGNOSIS — N170 Acute kidney failure with tubular necrosis: Secondary | ICD-10-CM | POA: Diagnosis not present

## 2020-02-17 DIAGNOSIS — R410 Disorientation, unspecified: Secondary | ICD-10-CM | POA: Diagnosis not present

## 2020-02-17 DIAGNOSIS — R531 Weakness: Secondary | ICD-10-CM | POA: Diagnosis not present

## 2020-02-17 DIAGNOSIS — M1990 Unspecified osteoarthritis, unspecified site: Secondary | ICD-10-CM | POA: Diagnosis not present

## 2020-02-17 DIAGNOSIS — J90 Pleural effusion, not elsewhere classified: Secondary | ICD-10-CM | POA: Diagnosis not present

## 2020-02-17 DIAGNOSIS — S59912A Unspecified injury of left forearm, initial encounter: Secondary | ICD-10-CM | POA: Diagnosis not present

## 2020-02-17 DIAGNOSIS — D175 Benign lipomatous neoplasm of intra-abdominal organs: Secondary | ICD-10-CM | POA: Diagnosis not present

## 2020-02-17 DIAGNOSIS — Z23 Encounter for immunization: Secondary | ICD-10-CM | POA: Diagnosis not present

## 2020-02-17 DIAGNOSIS — Z6831 Body mass index (BMI) 31.0-31.9, adult: Secondary | ICD-10-CM | POA: Diagnosis not present

## 2020-02-17 DIAGNOSIS — G9341 Metabolic encephalopathy: Secondary | ICD-10-CM | POA: Diagnosis not present

## 2020-02-17 DIAGNOSIS — R0689 Other abnormalities of breathing: Secondary | ICD-10-CM | POA: Diagnosis not present

## 2020-02-17 DIAGNOSIS — E669 Obesity, unspecified: Secondary | ICD-10-CM | POA: Diagnosis not present

## 2020-02-17 DIAGNOSIS — I10 Essential (primary) hypertension: Secondary | ICD-10-CM | POA: Diagnosis not present

## 2020-02-17 DIAGNOSIS — R519 Headache, unspecified: Secondary | ICD-10-CM | POA: Diagnosis not present

## 2020-02-17 DIAGNOSIS — Z888 Allergy status to other drugs, medicaments and biological substances status: Secondary | ICD-10-CM | POA: Diagnosis not present

## 2020-02-17 DIAGNOSIS — E7800 Pure hypercholesterolemia, unspecified: Secondary | ICD-10-CM | POA: Diagnosis not present

## 2020-02-17 DIAGNOSIS — S22080A Wedge compression fracture of T11-T12 vertebra, initial encounter for closed fracture: Secondary | ICD-10-CM | POA: Diagnosis not present

## 2020-02-17 DIAGNOSIS — E041 Nontoxic single thyroid nodule: Secondary | ICD-10-CM | POA: Diagnosis not present

## 2020-02-17 DIAGNOSIS — I951 Orthostatic hypotension: Secondary | ICD-10-CM | POA: Diagnosis not present

## 2020-02-17 DIAGNOSIS — I959 Hypotension, unspecified: Secondary | ICD-10-CM | POA: Diagnosis not present

## 2020-02-17 DIAGNOSIS — E860 Dehydration: Secondary | ICD-10-CM | POA: Diagnosis not present

## 2020-02-17 DIAGNOSIS — E119 Type 2 diabetes mellitus without complications: Secondary | ICD-10-CM | POA: Diagnosis not present

## 2020-02-17 DIAGNOSIS — I700 Atherosclerosis of aorta: Secondary | ICD-10-CM | POA: Diagnosis not present

## 2020-02-17 DIAGNOSIS — R197 Diarrhea, unspecified: Secondary | ICD-10-CM | POA: Diagnosis not present

## 2020-02-17 DIAGNOSIS — N179 Acute kidney failure, unspecified: Secondary | ICD-10-CM | POA: Diagnosis not present

## 2020-02-18 DIAGNOSIS — N179 Acute kidney failure, unspecified: Secondary | ICD-10-CM | POA: Diagnosis not present

## 2020-02-18 DIAGNOSIS — E860 Dehydration: Secondary | ICD-10-CM | POA: Diagnosis not present

## 2020-02-18 DIAGNOSIS — R197 Diarrhea, unspecified: Secondary | ICD-10-CM | POA: Diagnosis not present

## 2020-02-19 DIAGNOSIS — R197 Diarrhea, unspecified: Secondary | ICD-10-CM | POA: Diagnosis not present

## 2020-02-19 DIAGNOSIS — E860 Dehydration: Secondary | ICD-10-CM | POA: Diagnosis not present

## 2020-02-19 DIAGNOSIS — N179 Acute kidney failure, unspecified: Secondary | ICD-10-CM | POA: Diagnosis not present

## 2020-02-25 DIAGNOSIS — N179 Acute kidney failure, unspecified: Secondary | ICD-10-CM | POA: Diagnosis not present

## 2020-03-20 DIAGNOSIS — N179 Acute kidney failure, unspecified: Secondary | ICD-10-CM | POA: Diagnosis not present

## 2020-03-21 DIAGNOSIS — Z79899 Other long term (current) drug therapy: Secondary | ICD-10-CM | POA: Diagnosis not present

## 2020-03-21 DIAGNOSIS — W19XXXA Unspecified fall, initial encounter: Secondary | ICD-10-CM | POA: Diagnosis not present

## 2020-03-21 DIAGNOSIS — E1140 Type 2 diabetes mellitus with diabetic neuropathy, unspecified: Secondary | ICD-10-CM | POA: Diagnosis not present

## 2020-03-21 DIAGNOSIS — G894 Chronic pain syndrome: Secondary | ICD-10-CM | POA: Diagnosis not present

## 2020-03-21 DIAGNOSIS — Z5181 Encounter for therapeutic drug level monitoring: Secondary | ICD-10-CM | POA: Diagnosis not present

## 2020-03-26 DIAGNOSIS — R296 Repeated falls: Secondary | ICD-10-CM | POA: Diagnosis not present

## 2020-03-26 DIAGNOSIS — R269 Unspecified abnormalities of gait and mobility: Secondary | ICD-10-CM | POA: Diagnosis not present

## 2020-03-26 DIAGNOSIS — M6281 Muscle weakness (generalized): Secondary | ICD-10-CM | POA: Diagnosis not present

## 2020-04-03 DIAGNOSIS — R296 Repeated falls: Secondary | ICD-10-CM | POA: Diagnosis not present

## 2020-04-03 DIAGNOSIS — R269 Unspecified abnormalities of gait and mobility: Secondary | ICD-10-CM | POA: Diagnosis not present

## 2020-04-03 DIAGNOSIS — M6281 Muscle weakness (generalized): Secondary | ICD-10-CM | POA: Diagnosis not present

## 2020-05-12 DIAGNOSIS — F331 Major depressive disorder, recurrent, moderate: Secondary | ICD-10-CM | POA: Diagnosis not present

## 2020-05-12 DIAGNOSIS — G894 Chronic pain syndrome: Secondary | ICD-10-CM | POA: Diagnosis not present

## 2020-05-12 DIAGNOSIS — F411 Generalized anxiety disorder: Secondary | ICD-10-CM | POA: Diagnosis not present

## 2020-06-16 DIAGNOSIS — F331 Major depressive disorder, recurrent, moderate: Secondary | ICD-10-CM | POA: Diagnosis not present

## 2020-06-16 DIAGNOSIS — F411 Generalized anxiety disorder: Secondary | ICD-10-CM | POA: Diagnosis not present

## 2020-06-23 DIAGNOSIS — R609 Edema, unspecified: Secondary | ICD-10-CM | POA: Diagnosis not present

## 2020-06-23 DIAGNOSIS — I517 Cardiomegaly: Secondary | ICD-10-CM | POA: Diagnosis not present

## 2020-06-23 DIAGNOSIS — M7989 Other specified soft tissue disorders: Secondary | ICD-10-CM | POA: Diagnosis not present

## 2020-06-23 DIAGNOSIS — R600 Localized edema: Secondary | ICD-10-CM | POA: Diagnosis not present

## 2020-06-23 DIAGNOSIS — R0602 Shortness of breath: Secondary | ICD-10-CM | POA: Diagnosis not present

## 2020-07-04 DIAGNOSIS — R0602 Shortness of breath: Secondary | ICD-10-CM | POA: Diagnosis not present

## 2020-07-04 DIAGNOSIS — I361 Nonrheumatic tricuspid (valve) insufficiency: Secondary | ICD-10-CM | POA: Diagnosis not present

## 2020-07-09 ENCOUNTER — Encounter: Payer: Self-pay | Admitting: Cardiology

## 2020-07-09 ENCOUNTER — Encounter: Payer: Self-pay | Admitting: *Deleted

## 2020-07-22 DIAGNOSIS — I700 Atherosclerosis of aorta: Secondary | ICD-10-CM | POA: Insufficient documentation

## 2020-07-22 DIAGNOSIS — J45909 Unspecified asthma, uncomplicated: Secondary | ICD-10-CM | POA: Insufficient documentation

## 2020-07-22 DIAGNOSIS — K635 Polyp of colon: Secondary | ICD-10-CM | POA: Insufficient documentation

## 2020-07-22 DIAGNOSIS — E785 Hyperlipidemia, unspecified: Secondary | ICD-10-CM | POA: Insufficient documentation

## 2020-07-22 DIAGNOSIS — E1140 Type 2 diabetes mellitus with diabetic neuropathy, unspecified: Secondary | ICD-10-CM | POA: Insufficient documentation

## 2020-07-22 DIAGNOSIS — F329 Major depressive disorder, single episode, unspecified: Secondary | ICD-10-CM | POA: Insufficient documentation

## 2020-07-22 DIAGNOSIS — J309 Allergic rhinitis, unspecified: Secondary | ICD-10-CM | POA: Insufficient documentation

## 2020-07-22 DIAGNOSIS — F32A Depression, unspecified: Secondary | ICD-10-CM | POA: Insufficient documentation

## 2020-07-22 DIAGNOSIS — E7800 Pure hypercholesterolemia, unspecified: Secondary | ICD-10-CM | POA: Insufficient documentation

## 2020-07-22 DIAGNOSIS — E669 Obesity, unspecified: Secondary | ICD-10-CM | POA: Insufficient documentation

## 2020-07-22 DIAGNOSIS — M1990 Unspecified osteoarthritis, unspecified site: Secondary | ICD-10-CM | POA: Insufficient documentation

## 2020-07-24 ENCOUNTER — Ambulatory Visit: Payer: PPO | Admitting: Cardiology

## 2020-07-24 DIAGNOSIS — I50811 Acute right heart failure: Secondary | ICD-10-CM | POA: Diagnosis not present

## 2020-07-24 DIAGNOSIS — G4733 Obstructive sleep apnea (adult) (pediatric): Secondary | ICD-10-CM | POA: Diagnosis not present

## 2020-07-24 DIAGNOSIS — M1990 Unspecified osteoarthritis, unspecified site: Secondary | ICD-10-CM | POA: Diagnosis not present

## 2020-07-24 DIAGNOSIS — I350 Nonrheumatic aortic (valve) stenosis: Secondary | ICD-10-CM | POA: Diagnosis not present

## 2020-07-24 DIAGNOSIS — I083 Combined rheumatic disorders of mitral, aortic and tricuspid valves: Secondary | ICD-10-CM | POA: Diagnosis not present

## 2020-07-24 DIAGNOSIS — I361 Nonrheumatic tricuspid (valve) insufficiency: Secondary | ICD-10-CM | POA: Diagnosis not present

## 2020-07-24 DIAGNOSIS — R0602 Shortness of breath: Secondary | ICD-10-CM | POA: Diagnosis not present

## 2020-07-24 DIAGNOSIS — I517 Cardiomegaly: Secondary | ICD-10-CM | POA: Diagnosis not present

## 2020-07-24 DIAGNOSIS — Z6839 Body mass index (BMI) 39.0-39.9, adult: Secondary | ICD-10-CM | POA: Diagnosis not present

## 2020-07-24 DIAGNOSIS — Z79899 Other long term (current) drug therapy: Secondary | ICD-10-CM | POA: Diagnosis not present

## 2020-07-24 DIAGNOSIS — I340 Nonrheumatic mitral (valve) insufficiency: Secondary | ICD-10-CM | POA: Diagnosis not present

## 2020-07-24 DIAGNOSIS — J9811 Atelectasis: Secondary | ICD-10-CM | POA: Diagnosis not present

## 2020-07-24 DIAGNOSIS — J189 Pneumonia, unspecified organism: Secondary | ICD-10-CM | POA: Diagnosis not present

## 2020-07-24 DIAGNOSIS — J45998 Other asthma: Secondary | ICD-10-CM | POA: Diagnosis not present

## 2020-07-24 DIAGNOSIS — E119 Type 2 diabetes mellitus without complications: Secondary | ICD-10-CM | POA: Diagnosis not present

## 2020-07-24 DIAGNOSIS — E662 Morbid (severe) obesity with alveolar hypoventilation: Secondary | ICD-10-CM | POA: Diagnosis not present

## 2020-07-24 DIAGNOSIS — I10 Essential (primary) hypertension: Secondary | ICD-10-CM | POA: Diagnosis not present

## 2020-07-24 DIAGNOSIS — R000 Tachycardia, unspecified: Secondary | ICD-10-CM | POA: Diagnosis not present

## 2020-07-24 DIAGNOSIS — R069 Unspecified abnormalities of breathing: Secondary | ICD-10-CM | POA: Diagnosis not present

## 2020-07-24 DIAGNOSIS — Z9981 Dependence on supplemental oxygen: Secondary | ICD-10-CM | POA: Diagnosis not present

## 2020-07-24 DIAGNOSIS — I959 Hypotension, unspecified: Secondary | ICD-10-CM | POA: Diagnosis not present

## 2020-07-24 DIAGNOSIS — I110 Hypertensive heart disease with heart failure: Secondary | ICD-10-CM | POA: Diagnosis not present

## 2020-07-24 DIAGNOSIS — J9601 Acute respiratory failure with hypoxia: Secondary | ICD-10-CM | POA: Diagnosis not present

## 2020-07-24 DIAGNOSIS — I5031 Acute diastolic (congestive) heart failure: Secondary | ICD-10-CM | POA: Diagnosis not present

## 2020-07-25 DIAGNOSIS — I340 Nonrheumatic mitral (valve) insufficiency: Secondary | ICD-10-CM | POA: Diagnosis not present

## 2020-07-25 DIAGNOSIS — I361 Nonrheumatic tricuspid (valve) insufficiency: Secondary | ICD-10-CM | POA: Diagnosis not present

## 2020-07-25 DIAGNOSIS — I350 Nonrheumatic aortic (valve) stenosis: Secondary | ICD-10-CM | POA: Diagnosis not present

## 2020-08-05 ENCOUNTER — Encounter: Payer: Self-pay | Admitting: Cardiology

## 2020-08-12 ENCOUNTER — Encounter: Payer: Self-pay | Admitting: Cardiology

## 2020-08-12 ENCOUNTER — Ambulatory Visit (INDEPENDENT_AMBULATORY_CARE_PROVIDER_SITE_OTHER): Payer: PPO | Admitting: Cardiology

## 2020-08-12 ENCOUNTER — Other Ambulatory Visit: Payer: Self-pay

## 2020-08-12 VITALS — BP 106/62 | HR 86 | Ht 64.0 in | Wt 213.8 lb

## 2020-08-12 DIAGNOSIS — I272 Pulmonary hypertension, unspecified: Secondary | ICD-10-CM

## 2020-08-12 DIAGNOSIS — I10 Essential (primary) hypertension: Secondary | ICD-10-CM | POA: Diagnosis not present

## 2020-08-12 DIAGNOSIS — I361 Nonrheumatic tricuspid (valve) insufficiency: Secondary | ICD-10-CM | POA: Diagnosis not present

## 2020-08-12 NOTE — Patient Instructions (Signed)

## 2020-08-12 NOTE — Progress Notes (Signed)
Cardiology Office Note:    Date:  08/12/2020   ID:  Jeanette [name], DOB 01-10-1941, MRN [id number]  PCP:  Townsend Roger, MD  Cardiologist:  Berniece Salines, DO  Electrophysiologist:  None   Referring MD: Nona Dell, Corene Cornea, MD     History of Present Illness:    Jeanette [name] is a 80 y.o. female with a hx of type 2 diabetes, hyperlipidemia, chronic diastolic heart failure is here today to establish cardiac care.  The patient was admitted to [facility] recently for shortness of breath.  At that time her symptoms were thought to be related to more to her lungs.  She was treated for acute respiratory failure.  As well as she was given some Lasix for diastolic heart failure.  She is here today and offers no complaints at this time.  She is excited that she recently moved from her home and now lives in an [place] and is doing very well.  No other complaints at this time.  Past Medical History:  Diagnosis Date  . Abscess of lower back 09/13/2016  . Allergic rhinitis   . Anemia 11/11/2017   Last Assessment & Plan:  Formatting of this note is different from the original. Lab Results  Component Value Date   WBC 5.8 11/16/2017   HGB 8.9 (L) 11/16/2017   HCT 27.9 (L) 11/16/2017   MCV 78.1 (L) 11/16/2017   PLT 264 11/16/2017   -Transfuse hemoglobin less than 7 - Continue iron supplement  . Aortic atherosclerosis ([place])   . Arthralgia of hip 03/01/2017  . Asthma   . Colon polyp   . Depressive disorder   . Diabetic neuropathy ([place])   . DM2 (diabetes mellitus, type 2) ([place]) 09/13/2016  . Encephalopathy 09/13/2016  . Fat necrosis of skin 09/13/2016  . High cholesterol   . History of lumbar fusion 12/06/2017  . History of surgical site infection 08/10/2017   Formatting of this note might be different from the original. Added automatically from request for surgery [phone]  . HTN (hypertension) 09/13/2016  . Hyperlipemia   . Impaired ambulation 11/14/2017   Last Assessment & Plan:   Formatting of this note might be different from the original. PT eval PT D/C Recs: 24/7 s/PRN a and HHPT. If appropriate assistance not available at home, rec inpt post acute therapy/low intensity prior to D/C home.  . Infected orthopedic implant, subsequent encounter 05/12/2018  . Lumbar degenerative disc disease 03/01/2017  . Lumbar radiculopathy 03/01/2017  . Obesity   . Osteoarthritis   . Respiratory failure, acute ([place]) 08/29/2016  . Vertebral osteomyelitis ([place]) 11/10/2017   Last Assessment & Plan:  Formatting of this note might be different from the original. Osteomyelitis at L5 and T12 seen on MRI on 7/25  PLAN  -  OSH cultures growing Staph Ludgunensis, oxacillin susceptible. ABX adjusted ID consulted, appreciate recs  -  Oxacillin and Rifampin until 9/15  Ortho consulted - concerned for acute OM - no surgical intervention - IV abx x 6 weeks   IR consulted  - CT gu    Past Surgical History:  Procedure Laterality Date  . ABDOMINAL HYSTERECTOMY    . APPENDECTOMY    . BACK SURGERY    . CARDIAC CATHETERIZATION     Normal in 2002 and 2009  . CHOLECYSTECTOMY    . KNEE SURGERY Bilateral   . SHOULDER SURGERY    . SPINE SURGERY      Current Medications: Current Meds  Medication Sig  . acetaminophen (TYLENOL) 500 MG tablet Take 1,000 mg by mouth every 6 (six) hours as needed for mild pain or moderate pain.  Marland Kitchen amLODipine (NORVASC) 5 MG tablet Take 5 mg by mouth daily.  Marland Kitchen FLUoxetine (PROZAC) 40 MG capsule Take 40 mg by mouth daily.  Marland Kitchen gabapentin (NEURONTIN) 300 MG capsule Take 600 mg by mouth 2 (two) times daily.  Marland Kitchen lisinopril (ZESTRIL) 40 MG tablet Take 40 mg by mouth daily.  Marland Kitchen nystatin (MYCOSTATIN/NYSTOP) powder Apply 1 application topically 2 (two) times daily.  . simvastatin (ZOCOR) 40 MG tablet Take 40 mg by mouth daily.  . [DISCONTINUED] clonazePAM (KLONOPIN) 0.5 MG tablet Take 0.5 mg by mouth daily.  . [DISCONTINUED] hydrochlorothiazide (HYDRODIURIL) 25 MG tablet Take 25 mg by  mouth daily.  . [DISCONTINUED] metFORMIN (GLUCOPHAGE) 500 MG tablet Take by mouth 2 (two) times daily with a meal.  . [DISCONTINUED] oxyCODONE-acetaminophen (PERCOCET) 10-325 MG tablet Take 1 tablet by mouth every 12 (twelve) hours as needed for pain.     Allergies:   Adenosine, Lidocaine, Metoprolol, Oxycodone, Tramadol, Cefepime, and Rifampin   Social History   Socioeconomic History  . Marital status: Married    Spouse name: Not on file  . Number of children: Not on file  . Years of education: Not on file  . Highest education level: Not on file  Occupational History  . Not on file  Tobacco Use  . Smoking status: Never Smoker  . Smokeless tobacco: Never Used  Vaping Use  . Vaping Use: Never used  Substance and Sexual Activity  . Alcohol use: No  . Drug use: No  . Sexual activity: Not on file  Other Topics Concern  . Not on file  Social History Narrative  . Not on file   Social Determinants of Health   Financial Resource Strain: Not on file  Food Insecurity: Not on file  Transportation Needs: Not on file  Physical Activity: Not on file  Stress: Not on file  Social Connections: Not on file     Family History: The patient's family history includes Cancer in her brother; Cervical cancer in her sister; Diabetes in her mother and sister; Heart disease in her father; Hypertension in her father and mother; Stroke in her mother.  ROS:   Review of Systems  Constitution: Negative for decreased appetite, fever and weight gain.  HENT: Negative for congestion, ear discharge, hoarse voice and sore throat.   Eyes: Negative for discharge, redness, vision loss in right eye and visual halos.  Cardiovascular: Negative for chest pain, dyspnea on exertion, leg swelling, orthopnea and palpitations.  Respiratory: Negative for cough, hemoptysis, shortness of breath and snoring.   Endocrine: Negative for heat intolerance and polyphagia.  Hematologic/Lymphatic: Negative for bleeding  problem. Does not bruise/bleed easily.  Skin: Negative for flushing, nail changes, rash and suspicious lesions.  Musculoskeletal: Negative for arthritis, joint pain, muscle cramps, myalgias, neck pain and stiffness.  Gastrointestinal: Negative for abdominal pain, bowel incontinence, diarrhea and excessive appetite.  Genitourinary: Negative for decreased libido, genital sores and incomplete emptying.  Neurological: Negative for brief paralysis, focal weakness, headaches and loss of balance.  Psychiatric/Behavioral: Negative for altered mental status, depression and suicidal ideas.  Allergic/Immunologic: Negative for HIV exposure and persistent infections.    EKGs/Labs/Other Studies Reviewed:    The following studies were reviewed today:   EKG:  The ekg ordered today demonstrates sinus rhythm, heart rate 86 bpm with sinus arrhythmia and underlying left bundle branch block.  Echocardiogram done July 04, 2020 at rate of hospital showed overall EF 55 to 60%.  Diastolic filling pattern is normal.  Left atrium is mildly dilated by volume.  Mild to moderate tricuspid regurgitation is present.  Right ventricle systolic pressure is 49 mmHg.  Recent Labs: No results found for requested labs within last 8760 hours.  Recent Lipid Panel No results found for: CHOL, TRIG, HDL, CHOLHDL, VLDL, LDLCALC, LDLDIRECT  Physical Exam:    VS:  BP 106/62 (BP Location: Right Arm)   Pulse 86   Ht 5\' 4"  (1.626 m)   Wt 213 lb 12.8 oz (97 kg)   SpO2 97%   BMI 36.70 kg/m     Wt Readings from Last 3 Encounters:  08/12/20 213 lb 12.8 oz (97 kg)  06/24/20 233 lb (105.7 kg)  10/28/17 170 lb (77.1 kg)     GEN: Well nourished, well developed in no acute distress HEENT: Normal NECK: No JVD; No carotid bruits LYMPHATICS: No lymphadenopathy CARDIAC: S1S2 noted,RRR, no murmurs, rubs, gallops RESPIRATORY:  Clear to auscultation without rales, wheezing or rhonchi  ABDOMEN: Soft, non-tender, non-distended, +bowel  sounds, no guarding. EXTREMITIES: No edema, No cyanosis, no clubbing MUSCULOSKELETAL:  No deformity  SKIN: Warm and dry NEUROLOGIC:  Alert and oriented x 3, non-focal PSYCHIATRIC:  Normal affect, good insight  ASSESSMENT:    1. Hypertension, unspecified type   2. Pulmonary hypertension, unspecified ([place])   3. Nonrheumatic tricuspid valve regurgitation    PLAN:     She offers no complaints at this time.  She is euvolemic.  I am going to continue the patient on her current medication regimen.  Her blood pressure is acceptable on amlodipine 5 mg daily, lisinopril 40 mg daily.  She is well of her tricuspid regurgitation at this time she does not have any right-sided heart failure we will continue to monitor the patient.  The patient understands the need to lose weight with diet and exercise. We have discussed specific strategies for this.  The patient is in agreement with the above plan. The patient left the office in stable condition.  The patient will follow up in   Medication Adjustments/Labs and Tests Ordered: Current medicines are reviewed at length with the patient today.  Concerns regarding medicines are outlined above.  Orders Placed This Encounter  Procedures  . EKG 12-Lead   No orders of the defined types were placed in this encounter.   Patient Instructions  Medication Instructions:  Your physician recommends that you continue on your current medications as directed. Please refer to the Current Medication list given to you today.  *If you need a refill on your cardiac medications before your next appointment, please call your pharmacy*   Lab Work: None If you have labs (blood work) drawn today and your tests are completely normal, you will receive your results only by: Marland Kitchen MyChart Message (if you have MyChart) OR . A paper copy in the mail If you have any lab test that is abnormal or we need to change your treatment, we will call you to review the  results.   Testing/Procedures: None   Follow-Up: At [facility], you and your health needs are our priority.  As part of our continuing mission to provide you with exceptional heart care, we have created designated Provider Care Teams.  These Care Teams include your primary Cardiologist (physician) and Advanced Practice Providers (APPs -  Physician Assistants and Nurse Practitioners) who all work together to provide you with the  care you need, when you need it.  We recommend signing up for the patient portal called "MyChart".  Sign up information is provided on this After Visit Summary.  MyChart is used to connect with patients for Virtual Visits (Telemedicine).  Patients are able to view lab/test results, encounter notes, upcoming appointments, etc.  Non-urgent messages can be sent to your provider as well.   To learn more about what you can do with MyChart, go to [email or web address].    Your next appointment:   6 month(s)  The format for your next appointment:   In Person  Provider:   Berniece Salines, DO   Other Instructions      Adopting a Healthy Lifestyle.  Know what a healthy weight is for you (roughly BMI <25) and aim to maintain this   Aim for 7+ servings of fruits and vegetables daily   65-80+ fluid ounces of water or unsweet tea for healthy kidneys   Limit to max 1 drink of alcohol per day; avoid smoking/tobacco   Limit animal fats in diet for cholesterol and heart health - choose grass fed whenever available   Avoid highly processed foods, and foods high in saturated/trans fats   Aim for low stress - take time to unwind and care for your mental health   Aim for 150 min of moderate intensity exercise weekly for heart health, and weights twice weekly for bone health   Aim for 7-9 hours of sleep daily   When it comes to diets, agreement about the perfect plan isnt easy to find, even among the experts. Experts at the [employer]  developed an idea known as the Healthy Eating Plate. Just imagine a plate divided into logical, healthy portions.   The emphasis is on diet quality:   Load up on vegetables and fruits - one-half of your plate: Aim for color and variety, and remember that potatoes dont count.   Go for whole grains - one-quarter of your plate: Whole wheat, barley, wheat berries, quinoa, oats, brown rice, and foods made with them. If you want pasta, go with whole wheat pasta.   Protein power - one-quarter of your plate: Fish, chicken, beans, and nuts are all healthy, versatile protein sources. Limit red meat.   The diet, however, does go beyond the plate, offering a few other suggestions.   Use healthy plant oils, such as olive, canola, soy, corn, sunflower and peanut. Check the labels, and avoid partially hydrogenated oil, which have unhealthy trans fats.   If youre thirsty, drink water. Coffee and tea are good in moderation, but skip sugary drinks and limit milk and dairy products to one or two daily servings.   The type of carbohydrate in the diet is more important than the amount. Some sources of carbohydrates, such as vegetables, fruits, whole grains, and beans-are healthier than others.   Finally, stay active  Signed, Berniece Salines, DO  08/12/2020 4:50 PM    [facility]

## 2020-08-26 DIAGNOSIS — I5032 Chronic diastolic (congestive) heart failure: Secondary | ICD-10-CM | POA: Diagnosis not present

## 2020-09-01 DIAGNOSIS — E1140 Type 2 diabetes mellitus with diabetic neuropathy, unspecified: Secondary | ICD-10-CM | POA: Diagnosis not present

## 2020-09-01 DIAGNOSIS — I5032 Chronic diastolic (congestive) heart failure: Secondary | ICD-10-CM | POA: Diagnosis not present

## 2020-09-01 DIAGNOSIS — G894 Chronic pain syndrome: Secondary | ICD-10-CM | POA: Diagnosis not present

## 2020-09-01 DIAGNOSIS — F330 Major depressive disorder, recurrent, mild: Secondary | ICD-10-CM | POA: Diagnosis not present

## 2020-10-06 DIAGNOSIS — R300 Dysuria: Secondary | ICD-10-CM | POA: Diagnosis not present

## 2020-10-06 DIAGNOSIS — J00 Acute nasopharyngitis [common cold]: Secondary | ICD-10-CM | POA: Diagnosis not present

## 2020-10-06 DIAGNOSIS — R197 Diarrhea, unspecified: Secondary | ICD-10-CM | POA: Diagnosis not present

## 2020-10-12 DIAGNOSIS — W19XXXA Unspecified fall, initial encounter: Secondary | ICD-10-CM | POA: Diagnosis not present

## 2020-10-12 DIAGNOSIS — E860 Dehydration: Secondary | ICD-10-CM | POA: Diagnosis not present

## 2020-10-12 DIAGNOSIS — I491 Atrial premature depolarization: Secondary | ICD-10-CM | POA: Diagnosis not present

## 2020-10-12 DIAGNOSIS — R222 Localized swelling, mass and lump, trunk: Secondary | ICD-10-CM | POA: Diagnosis not present

## 2020-10-12 DIAGNOSIS — G319 Degenerative disease of nervous system, unspecified: Secondary | ICD-10-CM | POA: Diagnosis not present

## 2020-10-12 DIAGNOSIS — R52 Pain, unspecified: Secondary | ICD-10-CM | POA: Diagnosis not present

## 2020-10-12 DIAGNOSIS — I499 Cardiac arrhythmia, unspecified: Secondary | ICD-10-CM | POA: Diagnosis not present

## 2020-10-12 DIAGNOSIS — Z79891 Long term (current) use of opiate analgesic: Secondary | ICD-10-CM | POA: Diagnosis not present

## 2020-10-12 DIAGNOSIS — I10 Essential (primary) hypertension: Secondary | ICD-10-CM | POA: Diagnosis not present

## 2020-10-12 DIAGNOSIS — S0990XA Unspecified injury of head, initial encounter: Secondary | ICD-10-CM | POA: Diagnosis not present

## 2020-10-12 DIAGNOSIS — S0003XA Contusion of scalp, initial encounter: Secondary | ICD-10-CM | POA: Diagnosis not present

## 2020-10-12 DIAGNOSIS — I517 Cardiomegaly: Secondary | ICD-10-CM | POA: Diagnosis not present

## 2020-10-12 DIAGNOSIS — E876 Hypokalemia: Secondary | ICD-10-CM | POA: Diagnosis not present

## 2020-10-12 DIAGNOSIS — R220 Localized swelling, mass and lump, head: Secondary | ICD-10-CM | POA: Diagnosis not present

## 2020-10-12 DIAGNOSIS — R42 Dizziness and giddiness: Secondary | ICD-10-CM | POA: Diagnosis not present

## 2020-10-12 DIAGNOSIS — M549 Dorsalgia, unspecified: Secondary | ICD-10-CM | POA: Diagnosis not present

## 2020-10-12 DIAGNOSIS — E539 Vitamin B deficiency, unspecified: Secondary | ICD-10-CM | POA: Diagnosis not present

## 2020-10-12 DIAGNOSIS — I493 Ventricular premature depolarization: Secondary | ICD-10-CM | POA: Diagnosis not present

## 2020-10-12 DIAGNOSIS — M47812 Spondylosis without myelopathy or radiculopathy, cervical region: Secondary | ICD-10-CM | POA: Diagnosis not present

## 2020-10-12 DIAGNOSIS — R55 Syncope and collapse: Secondary | ICD-10-CM | POA: Diagnosis not present

## 2020-10-12 DIAGNOSIS — M4312 Spondylolisthesis, cervical region: Secondary | ICD-10-CM | POA: Diagnosis not present

## 2020-10-12 DIAGNOSIS — Z79899 Other long term (current) drug therapy: Secondary | ICD-10-CM | POA: Diagnosis not present

## 2020-10-13 DIAGNOSIS — I10 Essential (primary) hypertension: Secondary | ICD-10-CM | POA: Diagnosis not present

## 2020-10-13 DIAGNOSIS — E860 Dehydration: Secondary | ICD-10-CM | POA: Diagnosis not present

## 2020-10-13 DIAGNOSIS — S0003XA Contusion of scalp, initial encounter: Secondary | ICD-10-CM | POA: Diagnosis not present

## 2020-10-13 DIAGNOSIS — W19XXXA Unspecified fall, initial encounter: Secondary | ICD-10-CM | POA: Diagnosis not present

## 2020-10-15 DIAGNOSIS — S0003XD Contusion of scalp, subsequent encounter: Secondary | ICD-10-CM | POA: Diagnosis not present

## 2020-10-15 DIAGNOSIS — I10 Essential (primary) hypertension: Secondary | ICD-10-CM | POA: Diagnosis not present

## 2020-10-15 DIAGNOSIS — D510 Vitamin B12 deficiency anemia due to intrinsic factor deficiency: Secondary | ICD-10-CM | POA: Diagnosis not present

## 2020-10-15 DIAGNOSIS — D509 Iron deficiency anemia, unspecified: Secondary | ICD-10-CM | POA: Diagnosis not present

## 2020-10-15 DIAGNOSIS — M1990 Unspecified osteoarthritis, unspecified site: Secondary | ICD-10-CM | POA: Diagnosis not present

## 2020-10-15 DIAGNOSIS — Z9181 History of falling: Secondary | ICD-10-CM | POA: Diagnosis not present

## 2020-10-15 DIAGNOSIS — E876 Hypokalemia: Secondary | ICD-10-CM | POA: Diagnosis not present

## 2020-10-15 DIAGNOSIS — I110 Hypertensive heart disease with heart failure: Secondary | ICD-10-CM | POA: Diagnosis not present

## 2020-10-15 DIAGNOSIS — F32A Depression, unspecified: Secondary | ICD-10-CM | POA: Diagnosis not present

## 2020-10-15 DIAGNOSIS — E785 Hyperlipidemia, unspecified: Secondary | ICD-10-CM | POA: Diagnosis not present

## 2020-10-15 DIAGNOSIS — J45909 Unspecified asthma, uncomplicated: Secondary | ICD-10-CM | POA: Diagnosis not present

## 2020-10-15 DIAGNOSIS — I509 Heart failure, unspecified: Secondary | ICD-10-CM | POA: Diagnosis not present

## 2020-11-14 DIAGNOSIS — E876 Hypokalemia: Secondary | ICD-10-CM | POA: Diagnosis not present

## 2020-11-14 DIAGNOSIS — D510 Vitamin B12 deficiency anemia due to intrinsic factor deficiency: Secondary | ICD-10-CM | POA: Diagnosis not present

## 2020-11-14 DIAGNOSIS — I110 Hypertensive heart disease with heart failure: Secondary | ICD-10-CM | POA: Diagnosis not present

## 2020-11-14 DIAGNOSIS — I509 Heart failure, unspecified: Secondary | ICD-10-CM | POA: Diagnosis not present

## 2020-11-14 DIAGNOSIS — S0003XD Contusion of scalp, subsequent encounter: Secondary | ICD-10-CM | POA: Diagnosis not present

## 2020-11-14 DIAGNOSIS — J45909 Unspecified asthma, uncomplicated: Secondary | ICD-10-CM | POA: Diagnosis not present

## 2020-11-14 DIAGNOSIS — D509 Iron deficiency anemia, unspecified: Secondary | ICD-10-CM | POA: Diagnosis not present

## 2020-11-14 DIAGNOSIS — Z9181 History of falling: Secondary | ICD-10-CM | POA: Diagnosis not present

## 2020-11-14 DIAGNOSIS — E785 Hyperlipidemia, unspecified: Secondary | ICD-10-CM | POA: Diagnosis not present

## 2020-11-14 DIAGNOSIS — F32A Depression, unspecified: Secondary | ICD-10-CM | POA: Diagnosis not present

## 2020-11-14 DIAGNOSIS — M1990 Unspecified osteoarthritis, unspecified site: Secondary | ICD-10-CM | POA: Diagnosis not present

## 2020-11-21 DIAGNOSIS — I509 Heart failure, unspecified: Secondary | ICD-10-CM | POA: Diagnosis not present

## 2020-11-26 DIAGNOSIS — W19XXXD Unspecified fall, subsequent encounter: Secondary | ICD-10-CM | POA: Diagnosis not present

## 2020-11-26 DIAGNOSIS — E538 Deficiency of other specified B group vitamins: Secondary | ICD-10-CM | POA: Diagnosis not present

## 2020-12-03 DIAGNOSIS — N390 Urinary tract infection, site not specified: Secondary | ICD-10-CM | POA: Diagnosis not present

## 2020-12-14 DIAGNOSIS — E785 Hyperlipidemia, unspecified: Secondary | ICD-10-CM | POA: Diagnosis not present

## 2020-12-14 DIAGNOSIS — D510 Vitamin B12 deficiency anemia due to intrinsic factor deficiency: Secondary | ICD-10-CM | POA: Diagnosis not present

## 2020-12-14 DIAGNOSIS — I509 Heart failure, unspecified: Secondary | ICD-10-CM | POA: Diagnosis not present

## 2020-12-14 DIAGNOSIS — I110 Hypertensive heart disease with heart failure: Secondary | ICD-10-CM | POA: Diagnosis not present

## 2020-12-14 DIAGNOSIS — D509 Iron deficiency anemia, unspecified: Secondary | ICD-10-CM | POA: Diagnosis not present

## 2020-12-14 DIAGNOSIS — F32A Depression, unspecified: Secondary | ICD-10-CM | POA: Diagnosis not present

## 2020-12-14 DIAGNOSIS — M1990 Unspecified osteoarthritis, unspecified site: Secondary | ICD-10-CM | POA: Diagnosis not present

## 2020-12-14 DIAGNOSIS — Z9181 History of falling: Secondary | ICD-10-CM | POA: Diagnosis not present

## 2020-12-14 DIAGNOSIS — J45909 Unspecified asthma, uncomplicated: Secondary | ICD-10-CM | POA: Diagnosis not present

## 2020-12-14 DIAGNOSIS — E876 Hypokalemia: Secondary | ICD-10-CM | POA: Diagnosis not present

## 2020-12-17 DIAGNOSIS — E119 Type 2 diabetes mellitus without complications: Secondary | ICD-10-CM | POA: Diagnosis not present

## 2020-12-17 DIAGNOSIS — E785 Hyperlipidemia, unspecified: Secondary | ICD-10-CM | POA: Diagnosis not present

## 2020-12-31 DIAGNOSIS — E538 Deficiency of other specified B group vitamins: Secondary | ICD-10-CM | POA: Diagnosis not present

## 2020-12-31 DIAGNOSIS — Z23 Encounter for immunization: Secondary | ICD-10-CM | POA: Diagnosis not present

## 2021-01-07 DIAGNOSIS — E7800 Pure hypercholesterolemia, unspecified: Secondary | ICD-10-CM | POA: Diagnosis not present

## 2021-01-07 DIAGNOSIS — F330 Major depressive disorder, recurrent, mild: Secondary | ICD-10-CM | POA: Diagnosis not present

## 2021-01-07 DIAGNOSIS — I10 Essential (primary) hypertension: Secondary | ICD-10-CM | POA: Diagnosis not present

## 2021-01-07 DIAGNOSIS — Z6836 Body mass index (BMI) 36.0-36.9, adult: Secondary | ICD-10-CM | POA: Diagnosis not present

## 2021-01-07 DIAGNOSIS — I700 Atherosclerosis of aorta: Secondary | ICD-10-CM | POA: Diagnosis not present

## 2021-01-07 DIAGNOSIS — Z0000 Encounter for general adult medical examination without abnormal findings: Secondary | ICD-10-CM | POA: Diagnosis not present

## 2021-01-07 DIAGNOSIS — Z1331 Encounter for screening for depression: Secondary | ICD-10-CM | POA: Diagnosis not present

## 2021-01-07 DIAGNOSIS — I5032 Chronic diastolic (congestive) heart failure: Secondary | ICD-10-CM | POA: Diagnosis not present

## 2021-01-07 DIAGNOSIS — J4520 Mild intermittent asthma, uncomplicated: Secondary | ICD-10-CM | POA: Diagnosis not present

## 2021-01-07 DIAGNOSIS — F411 Generalized anxiety disorder: Secondary | ICD-10-CM | POA: Diagnosis not present

## 2021-01-07 DIAGNOSIS — E1140 Type 2 diabetes mellitus with diabetic neuropathy, unspecified: Secondary | ICD-10-CM | POA: Diagnosis not present

## 2021-01-21 DIAGNOSIS — L820 Inflamed seborrheic keratosis: Secondary | ICD-10-CM | POA: Diagnosis not present

## 2021-02-03 ENCOUNTER — Telehealth: Payer: Self-pay | Admitting: Cardiology

## 2021-02-03 DIAGNOSIS — Z1231 Encounter for screening mammogram for malignant neoplasm of breast: Secondary | ICD-10-CM | POA: Diagnosis not present

## 2021-02-03 DIAGNOSIS — R002 Palpitations: Secondary | ICD-10-CM

## 2021-02-03 NOTE — Telephone Encounter (Signed)
I will be happy to see the patient but from my understanding she prefers to see a provider in [place] because she does not want to drive to [place].  Please update the patient about the earliest availability.  It would also be beneficial to asked the patient who she prefers to see and we can discuss with the individual provider for any need to expedite her visit.

## 2021-02-03 NOTE — Telephone Encounter (Signed)
This pt wants to be seen in [place]. Please schedule accordingly.  Thank you,  Delana Meyer, RN

## 2021-02-03 NOTE — Telephone Encounter (Signed)
Spoke with pt who states that she has been having palpations intermittently. Pt states that at night she occasionally has chest tightness. Pt states that she was told by her PCP to see Dr. Harriet Masson as they have noticed her heart rate bing over 100 on the vital signs that download to her PCP office. The earliest appointment in [place] is 03/16/21. How do you advise?

## 2021-02-03 NOTE — Telephone Encounter (Signed)
Spoke to patient she stated she has been having palpitations for the past 2 to 3 weeks.Stated palpitations seem worse.Occasional chest pain.Appointment offered with Dr.Tobb.Stated she does not want to come to [place].Stated hard for her to drive in [place].She wants to see a cardiologist at [facility] office.Advised I will send message to Dr.Tobb and [facility] triage.

## 2021-02-03 NOTE — Telephone Encounter (Signed)
Patient c/o Palpitations:  High priority if patient c/o lightheadedness, shortness of breath, or chest pain  How long have you had palpitations/irregular HR/ Afib? Are you having the symptoms now? Noticed irregular HR readings 10/1, 10/14, and this morning  Are you currently experiencing lightheadedness, SOB or CP? Has a baseline of SOB, but may have increased  Do you have a history of afib (atrial fibrillation) or irregular heart rhythm? no  Have you checked your BP or HR? (document readings if available): 120/75 HR 104 this morning, was 97/76 HR 70's at one time  Are you experiencing any other symptoms? No   Jeanette [name], clinical pharmacist with Dr. Doran Durand office states she monitors the patient's BP remotely and she has had some irregular heart rhythm. She says the patient states the last 2 or 3 days she has noticed a harder heart beat. Phone: [phone]

## 2021-02-04 NOTE — Addendum Note (Signed)
Addended by: Truddie Hidden on: 02/04/2021 09:03 AM   Modules accepted: Orders

## 2021-02-04 NOTE — Telephone Encounter (Signed)
Left a VM for pt to callback. We will order a 14 day zio for palpations and schedule an appointment after speaking with Dr. Bettina Gavia.

## 2021-02-05 NOTE — Telephone Encounter (Signed)
Pt will come 02/05/21 for 14 day zio and appointment with Dr. Geraldo Pitter in November.

## 2021-02-06 ENCOUNTER — Ambulatory Visit (INDEPENDENT_AMBULATORY_CARE_PROVIDER_SITE_OTHER): Payer: PPO

## 2021-02-06 ENCOUNTER — Other Ambulatory Visit: Payer: Self-pay

## 2021-02-06 DIAGNOSIS — R002 Palpitations: Secondary | ICD-10-CM

## 2021-02-19 DIAGNOSIS — R002 Palpitations: Secondary | ICD-10-CM | POA: Diagnosis not present

## 2021-02-23 DIAGNOSIS — L820 Inflamed seborrheic keratosis: Secondary | ICD-10-CM | POA: Diagnosis not present

## 2021-02-27 DIAGNOSIS — E1140 Type 2 diabetes mellitus with diabetic neuropathy, unspecified: Secondary | ICD-10-CM | POA: Diagnosis not present

## 2021-03-02 ENCOUNTER — Telehealth: Payer: Self-pay | Admitting: Cardiology

## 2021-03-02 NOTE — Telephone Encounter (Signed)
Patient returning call for monitor results. 

## 2021-03-02 NOTE — Telephone Encounter (Signed)
Results reviewed with pt as per Dr. Revankar's note.  Pt verbalized understanding and had no additional questions. Routed to PCP.  

## 2021-03-10 ENCOUNTER — Other Ambulatory Visit: Payer: Self-pay

## 2021-03-11 DIAGNOSIS — N6002 Solitary cyst of left breast: Secondary | ICD-10-CM | POA: Diagnosis not present

## 2021-03-11 DIAGNOSIS — R928 Other abnormal and inconclusive findings on diagnostic imaging of breast: Secondary | ICD-10-CM | POA: Diagnosis not present

## 2021-03-16 ENCOUNTER — Other Ambulatory Visit: Payer: Self-pay

## 2021-03-16 ENCOUNTER — Encounter: Payer: Self-pay | Admitting: Cardiology

## 2021-03-16 ENCOUNTER — Ambulatory Visit (INDEPENDENT_AMBULATORY_CARE_PROVIDER_SITE_OTHER): Payer: PPO | Admitting: Cardiology

## 2021-03-16 VITALS — BP 124/60 | HR 90 | Ht 66.0 in | Wt 226.0 lb

## 2021-03-16 DIAGNOSIS — I10 Essential (primary) hypertension: Secondary | ICD-10-CM | POA: Diagnosis not present

## 2021-03-16 DIAGNOSIS — E782 Mixed hyperlipidemia: Secondary | ICD-10-CM | POA: Diagnosis not present

## 2021-03-16 DIAGNOSIS — I340 Nonrheumatic mitral (valve) insufficiency: Secondary | ICD-10-CM | POA: Diagnosis not present

## 2021-03-16 DIAGNOSIS — I350 Nonrheumatic aortic (valve) stenosis: Secondary | ICD-10-CM | POA: Insufficient documentation

## 2021-03-16 DIAGNOSIS — I700 Atherosclerosis of aorta: Secondary | ICD-10-CM

## 2021-03-16 NOTE — Patient Instructions (Signed)

## 2021-03-16 NOTE — Progress Notes (Signed)
Cardiology Office Note:    Date:  03/16/2021   ID:  [name], DOB [birth year], MRN [id number]  PCP:  Townsend Roger, MD  Cardiologist:  Jenean Lindau, MD   Referring MD: Townsend Roger, MD    ASSESSMENT:    1. Aortic atherosclerosis ([place])   2. Primary hypertension   3. Mixed hyperlipidemia   4. Aortic stenosis, mild   5. Mitral valve insufficiency, unspecified etiology    PLAN:    In order of problems listed above:  Primary prevention stressed with the patient.  Importance of compliance with diet medication stressed and she vocalized understanding. Essential hypertension: Blood pressure stable and diet was emphasized.  Lifestyle modification urged. Mild aortic stenosis: Asymptomatic and medical management at this time.  Echo report from [place] was reviewed extensively.  Discussed with patient. Obesity: Weight reduction was stressed diet emphasized.  Lifestyle modification urged. Diabetes mellitus with hemoglobin A1c greater than 8.  This is followed by primary care.  Diet emphasized.  And I told her that she needs to do better with this and she promises to do better.  Stable from cardiovascular conditions and I discussed this with her and reassured her.  I have not seen her before.  She was followed by my partner and I will be assuming care from here on. Patient will be seen in follow-up appointment in 9 months or earlier if the patient has any concerns    Medication Adjustments/Labs and Tests Ordered: Current medicines are reviewed at length with the patient today.  Concerns regarding medicines are outlined above.  No orders of the defined types were placed in this encounter.  No orders of the defined types were placed in this encounter.    No chief complaint on file.    History of Present Illness:    Jeanette [name] is a 80 y.o. female.  Patient has past medical history of palpitations, essential hypertension, mild aortic stenosis, diabetes  mellitus and obesity.  She leads a sedentary lifestyle.  She lives in an assisted living facility.  No chest pain orthopnea or PND.  She is happy about the fact that her palpitations have resolved significantly.  She is overweight.  At the time of my evaluation, the patient is alert awake oriented and in no distress.  Past Medical History:  Diagnosis Date   Abscess of lower back 09/13/2016   Allergic rhinitis    Anemia 11/11/2017   Last Assessment & Plan:  Formatting of this note is different from the original. Lab Results  Component Value Date   WBC 5.8 11/16/2017   HGB 8.9 (L) 11/16/2017   HCT 27.9 (L) 11/16/2017   MCV 78.1 (L) 11/16/2017   PLT 264 11/16/2017   -Transfuse hemoglobin less than 7 - Continue iron supplement   Aortic atherosclerosis (HCC)    Arthralgia of hip 03/01/2017   Asthma    Colon polyp    Depressive disorder    Diabetic neuropathy (HCC)    DM2 (diabetes mellitus, type 2) ([place]) 09/13/2016   Encephalopathy 09/13/2016   Fat necrosis of skin 09/13/2016   High cholesterol    History of lumbar fusion 12/06/2017   History of surgical site infection 08/10/2017   Formatting of this note might be different from the original. Added automatically from request for surgery [id number]   History of total left knee replacement 05/12/2018   HTN (hypertension) 09/13/2016   Hyperlipemia    Impaired ambulation 11/14/2017   Last Assessment & Plan:  Formatting of this note might be different from the original. PT eval PT D/C Recs: 24/7 s/PRN a and HHPT. If appropriate assistance not available at home, rec inpt post acute therapy/low intensity prior to D/C home.   Infected orthopedic implant, subsequent encounter 05/12/2018   Lumbar degenerative disc disease 03/01/2017   Lumbar radiculopathy 03/01/2017   Obesity    Osteoarthritis    Respiratory failure, acute ([place]) 08/29/2016   Vertebral osteomyelitis ([place]) 11/10/2017   Last Assessment & Plan:  Formatting of this note might be different from the  original. Osteomyelitis at L5 and T12 seen on MRI on 7/25  PLAN  -  OSH cultures growing Staph Ludgunensis, oxacillin susceptible. ABX adjusted ID consulted, appreciate recs  -  Oxacillin and Rifampin until 9/15  Ortho consulted - concerned for acute OM - no surgical intervention - IV abx x 6 weeks   IR consulted  - CT gu    Past Surgical History:  Procedure Laterality Date   ABDOMINAL HYSTERECTOMY     APPENDECTOMY     BACK SURGERY     CARDIAC CATHETERIZATION     Normal in 2002 and 2009   CHOLECYSTECTOMY     KNEE SURGERY Bilateral    SHOULDER SURGERY     SPINE SURGERY      Current Medications: Current Meds  Medication Sig   acetaminophen (TYLENOL) 500 MG tablet Take 1,000 mg by mouth every 6 (six) hours as needed for mild pain or moderate pain.   amLODipine (NORVASC) 5 MG tablet Take 5 mg by mouth daily.   cephALEXin (KEFLEX) 500 MG capsule Take 500 mg by mouth 2 (two) times daily.   FLUoxetine (PROZAC) 40 MG capsule Take 40 mg by mouth daily.   gabapentin (NEURONTIN) 300 MG capsule Take 600 mg by mouth 2 (two) times daily.   lisinopril (ZESTRIL) 40 MG tablet Take 40 mg by mouth daily.   metFORMIN (GLUCOPHAGE) 500 MG tablet Take 500 mg by mouth 2 (two) times daily.   nystatin (MYCOSTATIN/NYSTOP) powder Apply 1 application topically 2 (two) times daily.   simvastatin (ZOCOR) 40 MG tablet Take 40 mg by mouth daily.     Allergies:   Adenosine, Lidocaine, Metoprolol, Oxycodone, Tramadol, Cefepime, and Rifampin   Social History   Socioeconomic History   Marital status: Married    Spouse name: Not on file   Number of children: Not on file   Years of education: Not on file   Highest education level: Not on file  Occupational History   Not on file  Tobacco Use   Smoking status: Never   Smokeless tobacco: Never  Vaping Use   Vaping Use: Never used  Substance and Sexual Activity   Alcohol use: No   Drug use: No   Sexual activity: Not on file  Other Topics Concern   Not on  file  Social History Narrative   Not on file   Social Determinants of Health   Financial Resource Strain: Not on file  Food Insecurity: Not on file  Transportation Needs: Not on file  Physical Activity: Not on file  Stress: Not on file  Social Connections: Not on file     Family History: The patient's family history includes Cancer in her brother; Cervical cancer in her sister; Diabetes in her mother and sister; Heart disease in her father; Hypertension in her father and mother; Stroke in her mother.  ROS:   Please see the history of present illness.    All other systems  reviewed and are negative.  EKGs/Labs/Other Studies Reviewed:    The following studies were reviewed today: I discussed my findings with the patient at length.   Recent Labs: No results found for requested labs within last 8760 hours.  Recent Lipid Panel No results found for: CHOL, TRIG, HDL, CHOLHDL, VLDL, LDLCALC, LDLDIRECT  Physical Exam:    VS:  BP 124/60   Pulse 90   Ht 5\' 6"  (1.676 m)   Wt 226 lb (102.5 kg)   BMI 36.48 kg/m     Wt Readings from Last 3 Encounters:  03/16/21 226 lb (102.5 kg)  08/12/20 213 lb 12.8 oz (97 kg)  06/24/20 233 lb (105.7 kg)     GEN: Patient is in no acute distress HEENT: Normal NECK: No JVD; No carotid bruits LYMPHATICS: No lymphadenopathy CARDIAC: Hear sounds regular, 2/6 systolic murmur at the apex. RESPIRATORY:  Clear to auscultation without rales, wheezing or rhonchi  ABDOMEN: Soft, non-tender, non-distended MUSCULOSKELETAL:  No edema; No deformity  SKIN: Warm and dry NEUROLOGIC:  Alert and oriented x 3 PSYCHIATRIC:  Normal affect   Signed, Jenean Lindau, MD  03/16/2021 3:02 PM    [facility]

## 2021-04-07 DIAGNOSIS — U071 COVID-19: Secondary | ICD-10-CM | POA: Diagnosis not present

## 2021-05-28 DIAGNOSIS — R8290 Unspecified abnormal findings in urine: Secondary | ICD-10-CM | POA: Diagnosis not present

## 2021-05-28 DIAGNOSIS — R350 Frequency of micturition: Secondary | ICD-10-CM | POA: Diagnosis not present

## 2021-07-10 DIAGNOSIS — N390 Urinary tract infection, site not specified: Secondary | ICD-10-CM | POA: Diagnosis not present

## 2021-08-13 DIAGNOSIS — E1159 Type 2 diabetes mellitus with other circulatory complications: Secondary | ICD-10-CM | POA: Diagnosis not present

## 2021-08-13 DIAGNOSIS — Z7984 Long term (current) use of oral hypoglycemic drugs: Secondary | ICD-10-CM | POA: Diagnosis not present

## 2021-08-13 DIAGNOSIS — F3342 Major depressive disorder, recurrent, in full remission: Secondary | ICD-10-CM | POA: Diagnosis not present

## 2021-08-13 DIAGNOSIS — F419 Anxiety disorder, unspecified: Secondary | ICD-10-CM | POA: Diagnosis not present

## 2021-08-13 DIAGNOSIS — I10 Essential (primary) hypertension: Secondary | ICD-10-CM | POA: Diagnosis not present

## 2021-08-13 DIAGNOSIS — I700 Atherosclerosis of aorta: Secondary | ICD-10-CM | POA: Diagnosis not present

## 2021-08-13 DIAGNOSIS — D692 Other nonthrombocytopenic purpura: Secondary | ICD-10-CM | POA: Diagnosis not present

## 2021-08-13 DIAGNOSIS — E785 Hyperlipidemia, unspecified: Secondary | ICD-10-CM | POA: Diagnosis not present

## 2021-08-18 DIAGNOSIS — E1140 Type 2 diabetes mellitus with diabetic neuropathy, unspecified: Secondary | ICD-10-CM | POA: Diagnosis not present

## 2021-09-20 DIAGNOSIS — A419 Sepsis, unspecified organism: Secondary | ICD-10-CM | POA: Diagnosis not present

## 2021-09-20 DIAGNOSIS — G928 Other toxic encephalopathy: Secondary | ICD-10-CM | POA: Diagnosis not present

## 2021-09-20 DIAGNOSIS — R410 Disorientation, unspecified: Secondary | ICD-10-CM | POA: Diagnosis not present

## 2021-09-20 DIAGNOSIS — R4182 Altered mental status, unspecified: Secondary | ICD-10-CM | POA: Diagnosis not present

## 2021-09-20 DIAGNOSIS — R509 Fever, unspecified: Secondary | ICD-10-CM | POA: Diagnosis not present

## 2021-09-20 DIAGNOSIS — I447 Left bundle-branch block, unspecified: Secondary | ICD-10-CM | POA: Diagnosis not present

## 2021-09-20 DIAGNOSIS — I160 Hypertensive urgency: Secondary | ICD-10-CM | POA: Diagnosis not present

## 2021-09-20 DIAGNOSIS — I10 Essential (primary) hypertension: Secondary | ICD-10-CM | POA: Diagnosis not present

## 2021-09-20 DIAGNOSIS — N3000 Acute cystitis without hematuria: Secondary | ICD-10-CM | POA: Diagnosis not present

## 2021-09-20 DIAGNOSIS — R42 Dizziness and giddiness: Secondary | ICD-10-CM | POA: Diagnosis not present

## 2021-09-21 DIAGNOSIS — Z79899 Other long term (current) drug therapy: Secondary | ICD-10-CM | POA: Diagnosis not present

## 2021-09-21 DIAGNOSIS — Z888 Allergy status to other drugs, medicaments and biological substances status: Secondary | ICD-10-CM | POA: Diagnosis not present

## 2021-09-21 DIAGNOSIS — I10 Essential (primary) hypertension: Secondary | ICD-10-CM | POA: Diagnosis not present

## 2021-09-21 DIAGNOSIS — B9620 Unspecified Escherichia coli [E. coli] as the cause of diseases classified elsewhere: Secondary | ICD-10-CM | POA: Diagnosis not present

## 2021-09-21 DIAGNOSIS — G9341 Metabolic encephalopathy: Secondary | ICD-10-CM | POA: Diagnosis not present

## 2021-09-21 DIAGNOSIS — Z7982 Long term (current) use of aspirin: Secondary | ICD-10-CM | POA: Diagnosis not present

## 2021-09-21 DIAGNOSIS — E785 Hyperlipidemia, unspecified: Secondary | ICD-10-CM | POA: Diagnosis not present

## 2021-09-21 DIAGNOSIS — Z7984 Long term (current) use of oral hypoglycemic drugs: Secondary | ICD-10-CM | POA: Diagnosis not present

## 2021-09-21 DIAGNOSIS — G928 Other toxic encephalopathy: Secondary | ICD-10-CM | POA: Diagnosis not present

## 2021-09-21 DIAGNOSIS — R4182 Altered mental status, unspecified: Secondary | ICD-10-CM | POA: Diagnosis not present

## 2021-09-21 DIAGNOSIS — A419 Sepsis, unspecified organism: Secondary | ICD-10-CM | POA: Diagnosis not present

## 2021-09-21 DIAGNOSIS — I5032 Chronic diastolic (congestive) heart failure: Secondary | ICD-10-CM | POA: Diagnosis not present

## 2021-09-21 DIAGNOSIS — N3000 Acute cystitis without hematuria: Secondary | ICD-10-CM | POA: Diagnosis not present

## 2021-09-21 DIAGNOSIS — M1990 Unspecified osteoarthritis, unspecified site: Secondary | ICD-10-CM | POA: Diagnosis not present

## 2021-09-21 DIAGNOSIS — N390 Urinary tract infection, site not specified: Secondary | ICD-10-CM | POA: Diagnosis not present

## 2021-09-21 DIAGNOSIS — I160 Hypertensive urgency: Secondary | ICD-10-CM | POA: Diagnosis not present

## 2021-09-21 DIAGNOSIS — I110 Hypertensive heart disease with heart failure: Secondary | ICD-10-CM | POA: Diagnosis not present

## 2021-09-21 DIAGNOSIS — E119 Type 2 diabetes mellitus without complications: Secondary | ICD-10-CM | POA: Diagnosis not present

## 2021-09-21 DIAGNOSIS — F32A Depression, unspecified: Secondary | ICD-10-CM | POA: Diagnosis not present

## 2021-09-21 DIAGNOSIS — I361 Nonrheumatic tricuspid (valve) insufficiency: Secondary | ICD-10-CM

## 2021-09-21 DIAGNOSIS — R42 Dizziness and giddiness: Secondary | ICD-10-CM | POA: Diagnosis not present

## 2021-09-21 DIAGNOSIS — Z792 Long term (current) use of antibiotics: Secondary | ICD-10-CM | POA: Diagnosis not present

## 2021-09-24 DIAGNOSIS — M1990 Unspecified osteoarthritis, unspecified site: Secondary | ICD-10-CM | POA: Diagnosis not present

## 2021-09-24 DIAGNOSIS — Z9181 History of falling: Secondary | ICD-10-CM | POA: Diagnosis not present

## 2021-09-24 DIAGNOSIS — I5030 Unspecified diastolic (congestive) heart failure: Secondary | ICD-10-CM | POA: Diagnosis not present

## 2021-09-24 DIAGNOSIS — N390 Urinary tract infection, site not specified: Secondary | ICD-10-CM | POA: Diagnosis not present

## 2021-09-24 DIAGNOSIS — Z9049 Acquired absence of other specified parts of digestive tract: Secondary | ICD-10-CM | POA: Diagnosis not present

## 2021-09-24 DIAGNOSIS — Z7982 Long term (current) use of aspirin: Secondary | ICD-10-CM | POA: Diagnosis not present

## 2021-09-24 DIAGNOSIS — G9341 Metabolic encephalopathy: Secondary | ICD-10-CM | POA: Diagnosis not present

## 2021-09-24 DIAGNOSIS — G928 Other toxic encephalopathy: Secondary | ICD-10-CM | POA: Diagnosis not present

## 2021-09-24 DIAGNOSIS — J45909 Unspecified asthma, uncomplicated: Secondary | ICD-10-CM | POA: Diagnosis not present

## 2021-09-24 DIAGNOSIS — E785 Hyperlipidemia, unspecified: Secondary | ICD-10-CM | POA: Diagnosis not present

## 2021-09-24 DIAGNOSIS — F32A Depression, unspecified: Secondary | ICD-10-CM | POA: Diagnosis not present

## 2021-09-24 DIAGNOSIS — I110 Hypertensive heart disease with heart failure: Secondary | ICD-10-CM | POA: Diagnosis not present

## 2021-09-24 DIAGNOSIS — E119 Type 2 diabetes mellitus without complications: Secondary | ICD-10-CM | POA: Diagnosis not present

## 2021-09-24 DIAGNOSIS — R410 Disorientation, unspecified: Secondary | ICD-10-CM | POA: Diagnosis not present

## 2021-09-26 DIAGNOSIS — N390 Urinary tract infection, site not specified: Secondary | ICD-10-CM | POA: Diagnosis not present

## 2021-09-29 DIAGNOSIS — M25551 Pain in right hip: Secondary | ICD-10-CM | POA: Diagnosis not present

## 2021-09-29 DIAGNOSIS — Z7982 Long term (current) use of aspirin: Secondary | ICD-10-CM | POA: Diagnosis not present

## 2021-09-29 DIAGNOSIS — M542 Cervicalgia: Secondary | ICD-10-CM | POA: Diagnosis not present

## 2021-09-29 DIAGNOSIS — M069 Rheumatoid arthritis, unspecified: Secondary | ICD-10-CM | POA: Diagnosis not present

## 2021-09-29 DIAGNOSIS — M545 Low back pain, unspecified: Secondary | ICD-10-CM | POA: Diagnosis not present

## 2021-09-29 DIAGNOSIS — I6523 Occlusion and stenosis of bilateral carotid arteries: Secondary | ICD-10-CM | POA: Diagnosis not present

## 2021-09-29 DIAGNOSIS — S199XXA Unspecified injury of neck, initial encounter: Secondary | ICD-10-CM | POA: Diagnosis not present

## 2021-09-29 DIAGNOSIS — Z043 Encounter for examination and observation following other accident: Secondary | ICD-10-CM | POA: Diagnosis not present

## 2021-09-29 DIAGNOSIS — I672 Cerebral atherosclerosis: Secondary | ICD-10-CM | POA: Diagnosis not present

## 2021-09-29 DIAGNOSIS — M47812 Spondylosis without myelopathy or radiculopathy, cervical region: Secondary | ICD-10-CM | POA: Diagnosis not present

## 2021-09-29 DIAGNOSIS — S0990XA Unspecified injury of head, initial encounter: Secondary | ICD-10-CM | POA: Diagnosis not present

## 2021-09-29 DIAGNOSIS — S4991XA Unspecified injury of right shoulder and upper arm, initial encounter: Secondary | ICD-10-CM | POA: Diagnosis not present

## 2021-09-29 DIAGNOSIS — M25511 Pain in right shoulder: Secondary | ICD-10-CM | POA: Diagnosis not present

## 2021-09-29 DIAGNOSIS — R9082 White matter disease, unspecified: Secondary | ICD-10-CM | POA: Diagnosis not present

## 2021-09-29 DIAGNOSIS — Z79899 Other long term (current) drug therapy: Secondary | ICD-10-CM | POA: Diagnosis not present

## 2021-09-29 DIAGNOSIS — R519 Headache, unspecified: Secondary | ICD-10-CM | POA: Diagnosis not present

## 2021-09-29 DIAGNOSIS — M50221 Other cervical disc displacement at C4-C5 level: Secondary | ICD-10-CM | POA: Diagnosis not present

## 2021-10-24 DIAGNOSIS — I5030 Unspecified diastolic (congestive) heart failure: Secondary | ICD-10-CM | POA: Diagnosis not present

## 2021-10-24 DIAGNOSIS — G9341 Metabolic encephalopathy: Secondary | ICD-10-CM | POA: Diagnosis not present

## 2021-10-24 DIAGNOSIS — G928 Other toxic encephalopathy: Secondary | ICD-10-CM | POA: Diagnosis not present

## 2021-10-24 DIAGNOSIS — F32A Depression, unspecified: Secondary | ICD-10-CM | POA: Diagnosis not present

## 2021-10-24 DIAGNOSIS — M1990 Unspecified osteoarthritis, unspecified site: Secondary | ICD-10-CM | POA: Diagnosis not present

## 2021-10-24 DIAGNOSIS — N390 Urinary tract infection, site not specified: Secondary | ICD-10-CM | POA: Diagnosis not present

## 2021-10-24 DIAGNOSIS — Z7982 Long term (current) use of aspirin: Secondary | ICD-10-CM | POA: Diagnosis not present

## 2021-10-24 DIAGNOSIS — R410 Disorientation, unspecified: Secondary | ICD-10-CM | POA: Diagnosis not present

## 2021-10-24 DIAGNOSIS — E785 Hyperlipidemia, unspecified: Secondary | ICD-10-CM | POA: Diagnosis not present

## 2021-10-24 DIAGNOSIS — I110 Hypertensive heart disease with heart failure: Secondary | ICD-10-CM | POA: Diagnosis not present

## 2021-10-24 DIAGNOSIS — J45909 Unspecified asthma, uncomplicated: Secondary | ICD-10-CM | POA: Diagnosis not present

## 2021-10-24 DIAGNOSIS — Z9049 Acquired absence of other specified parts of digestive tract: Secondary | ICD-10-CM | POA: Diagnosis not present

## 2021-10-24 DIAGNOSIS — E119 Type 2 diabetes mellitus without complications: Secondary | ICD-10-CM | POA: Diagnosis not present

## 2021-10-24 DIAGNOSIS — Z9181 History of falling: Secondary | ICD-10-CM | POA: Diagnosis not present

## 2021-10-28 DIAGNOSIS — R112 Nausea with vomiting, unspecified: Secondary | ICD-10-CM | POA: Diagnosis not present

## 2021-11-23 DIAGNOSIS — I110 Hypertensive heart disease with heart failure: Secondary | ICD-10-CM | POA: Diagnosis not present

## 2021-11-23 DIAGNOSIS — Z9049 Acquired absence of other specified parts of digestive tract: Secondary | ICD-10-CM | POA: Diagnosis not present

## 2021-11-23 DIAGNOSIS — Z7982 Long term (current) use of aspirin: Secondary | ICD-10-CM | POA: Diagnosis not present

## 2021-11-23 DIAGNOSIS — M1990 Unspecified osteoarthritis, unspecified site: Secondary | ICD-10-CM | POA: Diagnosis not present

## 2021-11-23 DIAGNOSIS — E119 Type 2 diabetes mellitus without complications: Secondary | ICD-10-CM | POA: Diagnosis not present

## 2021-11-23 DIAGNOSIS — I5030 Unspecified diastolic (congestive) heart failure: Secondary | ICD-10-CM | POA: Diagnosis not present

## 2021-11-23 DIAGNOSIS — Z9181 History of falling: Secondary | ICD-10-CM | POA: Diagnosis not present

## 2021-11-23 DIAGNOSIS — R410 Disorientation, unspecified: Secondary | ICD-10-CM | POA: Diagnosis not present

## 2021-11-23 DIAGNOSIS — F32A Depression, unspecified: Secondary | ICD-10-CM | POA: Diagnosis not present

## 2021-11-23 DIAGNOSIS — E785 Hyperlipidemia, unspecified: Secondary | ICD-10-CM | POA: Diagnosis not present

## 2021-11-23 DIAGNOSIS — J45909 Unspecified asthma, uncomplicated: Secondary | ICD-10-CM | POA: Diagnosis not present

## 2021-11-23 DIAGNOSIS — G928 Other toxic encephalopathy: Secondary | ICD-10-CM | POA: Diagnosis not present

## 2021-11-26 ENCOUNTER — Other Ambulatory Visit: Payer: Self-pay | Admitting: *Deleted

## 2021-11-26 NOTE — Patient Outreach (Signed)
  Care Coordination   11/26/2021 Name: Jeanette [name] MRN: [id number] DOB: [birth year]   Care Coordination Outreach Attempts:  An unsuccessful telephone outreach was attempted today to offer the patient information about available care coordination services as a benefit of their health plan.   Follow Up Plan:  Additional outreach attempts will be made to offer the patient care coordination information and services.   Encounter Outcome:  No Answer  Care Coordination Interventions Activated:  No   Care Coordination Interventions:  No, not indicated    Emelia Loron RN, BSN [place] [phone] [email or web address]

## 2021-11-27 DIAGNOSIS — E1159 Type 2 diabetes mellitus with other circulatory complications: Secondary | ICD-10-CM | POA: Diagnosis not present

## 2021-11-27 DIAGNOSIS — M869 Osteomyelitis, unspecified: Secondary | ICD-10-CM | POA: Diagnosis not present

## 2021-11-27 DIAGNOSIS — I10 Essential (primary) hypertension: Secondary | ICD-10-CM | POA: Diagnosis not present

## 2021-12-02 DIAGNOSIS — E1140 Type 2 diabetes mellitus with diabetic neuropathy, unspecified: Secondary | ICD-10-CM | POA: Diagnosis not present

## 2021-12-02 DIAGNOSIS — I10 Essential (primary) hypertension: Secondary | ICD-10-CM | POA: Diagnosis not present

## 2021-12-02 DIAGNOSIS — M25469 Effusion, unspecified knee: Secondary | ICD-10-CM | POA: Diagnosis not present

## 2021-12-02 DIAGNOSIS — I5032 Chronic diastolic (congestive) heart failure: Secondary | ICD-10-CM | POA: Diagnosis not present

## 2021-12-05 DIAGNOSIS — I10 Essential (primary) hypertension: Secondary | ICD-10-CM | POA: Diagnosis not present

## 2021-12-05 DIAGNOSIS — M25462 Effusion, left knee: Secondary | ICD-10-CM | POA: Diagnosis not present

## 2021-12-05 DIAGNOSIS — I959 Hypotension, unspecified: Secondary | ICD-10-CM | POA: Diagnosis not present

## 2021-12-05 DIAGNOSIS — R609 Edema, unspecified: Secondary | ICD-10-CM | POA: Diagnosis not present

## 2021-12-05 DIAGNOSIS — M79605 Pain in left leg: Secondary | ICD-10-CM | POA: Diagnosis not present

## 2021-12-05 DIAGNOSIS — M25562 Pain in left knee: Secondary | ICD-10-CM | POA: Diagnosis not present

## 2021-12-05 DIAGNOSIS — Z471 Aftercare following joint replacement surgery: Secondary | ICD-10-CM | POA: Diagnosis not present

## 2021-12-17 ENCOUNTER — Telehealth: Payer: Self-pay

## 2021-12-17 NOTE — Patient Outreach (Signed)
  Care Coordination   12/17/2021 Name: Jeanette [name] MRN: [id number] DOB: Jun 27, 1940   Care Coordination Outreach Attempts:  A second unsuccessful outreach was attempted today to offer the patient with information about available care coordination services as a benefit of their health plan.     Follow Up Plan:  Additional outreach attempts will be made to offer the patient care coordination information and services.   Encounter Outcome:  No Answer  Care Coordination Interventions Activated:  No   Care Coordination Interventions:  No, not indicated    Phone not in service  Tomasa Rand, RN, BSN, [facility] [place] Coordinator [phone]

## 2022-01-12 DIAGNOSIS — R0600 Dyspnea, unspecified: Secondary | ICD-10-CM | POA: Diagnosis not present

## 2022-01-12 DIAGNOSIS — J9601 Acute respiratory failure with hypoxia: Secondary | ICD-10-CM | POA: Diagnosis not present

## 2022-01-12 DIAGNOSIS — R1011 Right upper quadrant pain: Secondary | ICD-10-CM | POA: Diagnosis not present

## 2022-01-12 DIAGNOSIS — R52 Pain, unspecified: Secondary | ICD-10-CM | POA: Diagnosis not present

## 2022-01-12 DIAGNOSIS — N281 Cyst of kidney, acquired: Secondary | ICD-10-CM | POA: Diagnosis not present

## 2022-01-12 DIAGNOSIS — K8309 Other cholangitis: Secondary | ICD-10-CM | POA: Diagnosis not present

## 2022-01-12 DIAGNOSIS — Z20822 Contact with and (suspected) exposure to covid-19: Secondary | ICD-10-CM | POA: Diagnosis not present

## 2022-01-12 DIAGNOSIS — R0902 Hypoxemia: Secondary | ICD-10-CM | POA: Diagnosis not present

## 2022-01-12 DIAGNOSIS — R079 Chest pain, unspecified: Secondary | ICD-10-CM | POA: Diagnosis not present

## 2022-01-12 DIAGNOSIS — R0789 Other chest pain: Secondary | ICD-10-CM | POA: Diagnosis not present

## 2022-01-12 DIAGNOSIS — R6520 Severe sepsis without septic shock: Secondary | ICD-10-CM | POA: Diagnosis not present

## 2022-01-12 DIAGNOSIS — I447 Left bundle-branch block, unspecified: Secondary | ICD-10-CM | POA: Diagnosis not present

## 2022-01-12 DIAGNOSIS — A419 Sepsis, unspecified organism: Secondary | ICD-10-CM | POA: Diagnosis not present

## 2022-01-12 DIAGNOSIS — R1013 Epigastric pain: Secondary | ICD-10-CM | POA: Diagnosis not present

## 2022-01-13 ENCOUNTER — Encounter (HOSPITAL_COMMUNITY): Payer: Self-pay

## 2022-01-13 DIAGNOSIS — E1151 Type 2 diabetes mellitus with diabetic peripheral angiopathy without gangrene: Secondary | ICD-10-CM | POA: Diagnosis not present

## 2022-01-13 DIAGNOSIS — Z01818 Encounter for other preprocedural examination: Secondary | ICD-10-CM | POA: Diagnosis not present

## 2022-01-13 DIAGNOSIS — J45909 Unspecified asthma, uncomplicated: Secondary | ICD-10-CM | POA: Diagnosis not present

## 2022-01-13 DIAGNOSIS — K8309 Other cholangitis: Secondary | ICD-10-CM | POA: Diagnosis not present

## 2022-01-13 DIAGNOSIS — I517 Cardiomegaly: Secondary | ICD-10-CM | POA: Diagnosis not present

## 2022-01-13 DIAGNOSIS — E119 Type 2 diabetes mellitus without complications: Secondary | ICD-10-CM | POA: Diagnosis not present

## 2022-01-13 DIAGNOSIS — K838 Other specified diseases of biliary tract: Secondary | ICD-10-CM | POA: Diagnosis not present

## 2022-01-13 DIAGNOSIS — K219 Gastro-esophageal reflux disease without esophagitis: Secondary | ICD-10-CM | POA: Diagnosis not present

## 2022-01-13 DIAGNOSIS — E1165 Type 2 diabetes mellitus with hyperglycemia: Secondary | ICD-10-CM | POA: Diagnosis not present

## 2022-01-13 DIAGNOSIS — F32A Depression, unspecified: Secondary | ICD-10-CM | POA: Diagnosis not present

## 2022-01-13 DIAGNOSIS — I5030 Unspecified diastolic (congestive) heart failure: Secondary | ICD-10-CM | POA: Diagnosis not present

## 2022-01-13 DIAGNOSIS — T847XXD Infection and inflammatory reaction due to other internal orthopedic prosthetic devices, implants and grafts, subsequent encounter: Secondary | ICD-10-CM | POA: Diagnosis not present

## 2022-01-13 DIAGNOSIS — D1739 Benign lipomatous neoplasm of skin and subcutaneous tissue of other sites: Secondary | ICD-10-CM | POA: Diagnosis not present

## 2022-01-13 DIAGNOSIS — E089 Diabetes mellitus due to underlying condition without complications: Secondary | ICD-10-CM | POA: Diagnosis not present

## 2022-01-13 DIAGNOSIS — I340 Nonrheumatic mitral (valve) insufficiency: Secondary | ICD-10-CM | POA: Diagnosis not present

## 2022-01-13 DIAGNOSIS — E669 Obesity, unspecified: Secondary | ICD-10-CM | POA: Diagnosis not present

## 2022-01-13 DIAGNOSIS — M5136 Other intervertebral disc degeneration, lumbar region: Secondary | ICD-10-CM | POA: Diagnosis not present

## 2022-01-13 DIAGNOSIS — J9600 Acute respiratory failure, unspecified whether with hypoxia or hypercapnia: Secondary | ICD-10-CM | POA: Diagnosis not present

## 2022-01-13 DIAGNOSIS — K8031 Calculus of bile duct with cholangitis, unspecified, with obstruction: Secondary | ICD-10-CM | POA: Diagnosis not present

## 2022-01-13 DIAGNOSIS — A499 Bacterial infection, unspecified: Secondary | ICD-10-CM | POA: Diagnosis not present

## 2022-01-13 DIAGNOSIS — E876 Hypokalemia: Secondary | ICD-10-CM | POA: Diagnosis not present

## 2022-01-13 DIAGNOSIS — E785 Hyperlipidemia, unspecified: Secondary | ICD-10-CM | POA: Diagnosis not present

## 2022-01-13 DIAGNOSIS — R7989 Other specified abnormal findings of blood chemistry: Secondary | ICD-10-CM | POA: Diagnosis not present

## 2022-01-13 DIAGNOSIS — K8030 Calculus of bile duct with cholangitis, unspecified, without obstruction: Secondary | ICD-10-CM | POA: Diagnosis not present

## 2022-01-13 DIAGNOSIS — M1990 Unspecified osteoarthritis, unspecified site: Secondary | ICD-10-CM | POA: Diagnosis not present

## 2022-01-13 DIAGNOSIS — R0602 Shortness of breath: Secondary | ICD-10-CM | POA: Diagnosis not present

## 2022-01-13 DIAGNOSIS — R262 Difficulty in walking, not elsewhere classified: Secondary | ICD-10-CM | POA: Diagnosis not present

## 2022-01-13 DIAGNOSIS — I959 Hypotension, unspecified: Secondary | ICD-10-CM | POA: Diagnosis not present

## 2022-01-13 DIAGNOSIS — K5710 Diverticulosis of small intestine without perforation or abscess without bleeding: Secondary | ICD-10-CM | POA: Diagnosis not present

## 2022-01-13 DIAGNOSIS — Z743 Need for continuous supervision: Secondary | ICD-10-CM | POA: Diagnosis not present

## 2022-01-13 DIAGNOSIS — K769 Liver disease, unspecified: Secondary | ICD-10-CM | POA: Diagnosis not present

## 2022-01-13 DIAGNOSIS — G4730 Sleep apnea, unspecified: Secondary | ICD-10-CM | POA: Diagnosis not present

## 2022-01-13 DIAGNOSIS — K635 Polyp of colon: Secondary | ICD-10-CM | POA: Diagnosis not present

## 2022-01-13 DIAGNOSIS — K6389 Other specified diseases of intestine: Secondary | ICD-10-CM | POA: Diagnosis not present

## 2022-01-13 DIAGNOSIS — I088 Other rheumatic multiple valve diseases: Secondary | ICD-10-CM | POA: Diagnosis not present

## 2022-01-13 DIAGNOSIS — R2681 Unsteadiness on feet: Secondary | ICD-10-CM | POA: Diagnosis not present

## 2022-01-13 DIAGNOSIS — A498 Other bacterial infections of unspecified site: Secondary | ICD-10-CM | POA: Diagnosis not present

## 2022-01-13 DIAGNOSIS — Z1612 Extended spectrum beta lactamase (ESBL) resistance: Secondary | ICD-10-CM | POA: Diagnosis not present

## 2022-01-13 DIAGNOSIS — Z96652 Presence of left artificial knee joint: Secondary | ICD-10-CM | POA: Diagnosis not present

## 2022-01-13 DIAGNOSIS — A4151 Sepsis due to Escherichia coli [E. coli]: Secondary | ICD-10-CM | POA: Diagnosis not present

## 2022-01-13 DIAGNOSIS — D649 Anemia, unspecified: Secondary | ICD-10-CM | POA: Diagnosis not present

## 2022-01-13 DIAGNOSIS — K750 Abscess of liver: Secondary | ICD-10-CM | POA: Diagnosis not present

## 2022-01-13 DIAGNOSIS — K7689 Other specified diseases of liver: Secondary | ICD-10-CM | POA: Diagnosis not present

## 2022-01-13 DIAGNOSIS — R2689 Other abnormalities of gait and mobility: Secondary | ICD-10-CM | POA: Diagnosis not present

## 2022-01-13 DIAGNOSIS — R278 Other lack of coordination: Secondary | ICD-10-CM | POA: Diagnosis not present

## 2022-01-13 DIAGNOSIS — I4891 Unspecified atrial fibrillation: Secondary | ICD-10-CM | POA: Diagnosis not present

## 2022-01-13 DIAGNOSIS — I5189 Other ill-defined heart diseases: Secondary | ICD-10-CM | POA: Diagnosis not present

## 2022-01-13 DIAGNOSIS — E1140 Type 2 diabetes mellitus with diabetic neuropathy, unspecified: Secondary | ICD-10-CM | POA: Diagnosis not present

## 2022-01-13 DIAGNOSIS — I10 Essential (primary) hypertension: Secondary | ICD-10-CM | POA: Diagnosis not present

## 2022-01-13 DIAGNOSIS — J9601 Acute respiratory failure with hypoxia: Secondary | ICD-10-CM | POA: Diagnosis not present

## 2022-01-13 DIAGNOSIS — Z23 Encounter for immunization: Secondary | ICD-10-CM | POA: Diagnosis not present

## 2022-01-13 DIAGNOSIS — I519 Heart disease, unspecified: Secondary | ICD-10-CM | POA: Diagnosis not present

## 2022-01-13 DIAGNOSIS — R0902 Hypoxemia: Secondary | ICD-10-CM | POA: Diagnosis not present

## 2022-01-13 DIAGNOSIS — I429 Cardiomyopathy, unspecified: Secondary | ICD-10-CM | POA: Diagnosis not present

## 2022-01-13 DIAGNOSIS — A419 Sepsis, unspecified organism: Secondary | ICD-10-CM | POA: Diagnosis not present

## 2022-01-13 DIAGNOSIS — R600 Localized edema: Secondary | ICD-10-CM | POA: Diagnosis not present

## 2022-01-13 DIAGNOSIS — M6281 Muscle weakness (generalized): Secondary | ICD-10-CM | POA: Diagnosis not present

## 2022-01-26 DIAGNOSIS — E1140 Type 2 diabetes mellitus with diabetic neuropathy, unspecified: Secondary | ICD-10-CM | POA: Diagnosis not present

## 2022-01-26 DIAGNOSIS — I10 Essential (primary) hypertension: Secondary | ICD-10-CM | POA: Diagnosis not present

## 2022-01-26 DIAGNOSIS — J811 Chronic pulmonary edema: Secondary | ICD-10-CM | POA: Diagnosis not present

## 2022-01-26 DIAGNOSIS — M5136 Other intervertebral disc degeneration, lumbar region: Secondary | ICD-10-CM | POA: Diagnosis not present

## 2022-01-26 DIAGNOSIS — Z888 Allergy status to other drugs, medicaments and biological substances status: Secondary | ICD-10-CM | POA: Diagnosis not present

## 2022-01-26 DIAGNOSIS — K838 Other specified diseases of biliary tract: Secondary | ICD-10-CM | POA: Diagnosis not present

## 2022-01-26 DIAGNOSIS — I480 Paroxysmal atrial fibrillation: Secondary | ICD-10-CM | POA: Diagnosis not present

## 2022-01-26 DIAGNOSIS — I5030 Unspecified diastolic (congestive) heart failure: Secondary | ICD-10-CM | POA: Diagnosis not present

## 2022-01-26 DIAGNOSIS — M6281 Muscle weakness (generalized): Secondary | ICD-10-CM | POA: Diagnosis not present

## 2022-01-26 DIAGNOSIS — Z7901 Long term (current) use of anticoagulants: Secondary | ICD-10-CM | POA: Diagnosis not present

## 2022-01-26 DIAGNOSIS — J9601 Acute respiratory failure with hypoxia: Secondary | ICD-10-CM | POA: Diagnosis not present

## 2022-01-26 DIAGNOSIS — E785 Hyperlipidemia, unspecified: Secondary | ICD-10-CM | POA: Diagnosis not present

## 2022-01-26 DIAGNOSIS — K769 Liver disease, unspecified: Secondary | ICD-10-CM | POA: Diagnosis not present

## 2022-01-26 DIAGNOSIS — K8309 Other cholangitis: Secondary | ICD-10-CM | POA: Diagnosis not present

## 2022-01-26 DIAGNOSIS — R278 Other lack of coordination: Secondary | ICD-10-CM | POA: Diagnosis not present

## 2022-01-26 DIAGNOSIS — Z23 Encounter for immunization: Secondary | ICD-10-CM | POA: Diagnosis not present

## 2022-01-26 DIAGNOSIS — R4182 Altered mental status, unspecified: Secondary | ICD-10-CM | POA: Diagnosis not present

## 2022-01-26 DIAGNOSIS — Z7982 Long term (current) use of aspirin: Secondary | ICD-10-CM | POA: Diagnosis not present

## 2022-01-26 DIAGNOSIS — R2689 Other abnormalities of gait and mobility: Secondary | ICD-10-CM | POA: Diagnosis not present

## 2022-01-26 DIAGNOSIS — R262 Difficulty in walking, not elsewhere classified: Secondary | ICD-10-CM | POA: Diagnosis not present

## 2022-01-26 DIAGNOSIS — M1990 Unspecified osteoarthritis, unspecified site: Secondary | ICD-10-CM | POA: Diagnosis not present

## 2022-01-26 DIAGNOSIS — E089 Diabetes mellitus due to underlying condition without complications: Secondary | ICD-10-CM | POA: Diagnosis not present

## 2022-01-26 DIAGNOSIS — E1151 Type 2 diabetes mellitus with diabetic peripheral angiopathy without gangrene: Secondary | ICD-10-CM | POA: Diagnosis not present

## 2022-01-26 DIAGNOSIS — I5033 Acute on chronic diastolic (congestive) heart failure: Secondary | ICD-10-CM | POA: Diagnosis not present

## 2022-01-26 DIAGNOSIS — I509 Heart failure, unspecified: Secondary | ICD-10-CM | POA: Diagnosis not present

## 2022-01-26 DIAGNOSIS — E1165 Type 2 diabetes mellitus with hyperglycemia: Secondary | ICD-10-CM | POA: Diagnosis not present

## 2022-01-26 DIAGNOSIS — R7989 Other specified abnormal findings of blood chemistry: Secondary | ICD-10-CM | POA: Diagnosis not present

## 2022-01-26 DIAGNOSIS — W19XXXA Unspecified fall, initial encounter: Secondary | ICD-10-CM | POA: Diagnosis not present

## 2022-01-26 DIAGNOSIS — R55 Syncope and collapse: Secondary | ICD-10-CM | POA: Diagnosis not present

## 2022-01-26 DIAGNOSIS — K219 Gastro-esophageal reflux disease without esophagitis: Secondary | ICD-10-CM | POA: Diagnosis not present

## 2022-01-26 DIAGNOSIS — Z79899 Other long term (current) drug therapy: Secondary | ICD-10-CM | POA: Diagnosis not present

## 2022-01-26 DIAGNOSIS — F32A Depression, unspecified: Secondary | ICD-10-CM | POA: Diagnosis not present

## 2022-01-26 DIAGNOSIS — E1142 Type 2 diabetes mellitus with diabetic polyneuropathy: Secondary | ICD-10-CM | POA: Diagnosis not present

## 2022-01-26 DIAGNOSIS — I110 Hypertensive heart disease with heart failure: Secondary | ICD-10-CM | POA: Diagnosis not present

## 2022-01-26 DIAGNOSIS — G4489 Other headache syndrome: Secondary | ICD-10-CM | POA: Diagnosis not present

## 2022-01-26 DIAGNOSIS — Z1152 Encounter for screening for COVID-19: Secondary | ICD-10-CM | POA: Diagnosis not present

## 2022-01-26 DIAGNOSIS — A4151 Sepsis due to Escherichia coli [E. coli]: Secondary | ICD-10-CM | POA: Diagnosis not present

## 2022-01-26 DIAGNOSIS — R609 Edema, unspecified: Secondary | ICD-10-CM | POA: Diagnosis not present

## 2022-01-26 DIAGNOSIS — R2681 Unsteadiness on feet: Secondary | ICD-10-CM | POA: Diagnosis not present

## 2022-01-26 DIAGNOSIS — Z7984 Long term (current) use of oral hypoglycemic drugs: Secondary | ICD-10-CM | POA: Diagnosis not present

## 2022-01-26 DIAGNOSIS — S3993XA Unspecified injury of pelvis, initial encounter: Secondary | ICD-10-CM | POA: Diagnosis not present

## 2022-01-26 DIAGNOSIS — D649 Anemia, unspecified: Secondary | ICD-10-CM | POA: Diagnosis not present

## 2022-01-26 DIAGNOSIS — S060X0A Concussion without loss of consciousness, initial encounter: Secondary | ICD-10-CM | POA: Diagnosis not present

## 2022-01-26 DIAGNOSIS — E7800 Pure hypercholesterolemia, unspecified: Secondary | ICD-10-CM | POA: Diagnosis not present

## 2022-01-26 DIAGNOSIS — J45909 Unspecified asthma, uncomplicated: Secondary | ICD-10-CM | POA: Diagnosis not present

## 2022-01-26 DIAGNOSIS — I4891 Unspecified atrial fibrillation: Secondary | ICD-10-CM | POA: Diagnosis not present

## 2022-01-31 DIAGNOSIS — J811 Chronic pulmonary edema: Secondary | ICD-10-CM | POA: Diagnosis not present

## 2022-01-31 DIAGNOSIS — I110 Hypertensive heart disease with heart failure: Secondary | ICD-10-CM | POA: Diagnosis not present

## 2022-01-31 DIAGNOSIS — J9601 Acute respiratory failure with hypoxia: Secondary | ICD-10-CM | POA: Diagnosis not present

## 2022-01-31 DIAGNOSIS — G4489 Other headache syndrome: Secondary | ICD-10-CM | POA: Diagnosis not present

## 2022-01-31 DIAGNOSIS — M1990 Unspecified osteoarthritis, unspecified site: Secondary | ICD-10-CM | POA: Diagnosis not present

## 2022-01-31 DIAGNOSIS — F32A Depression, unspecified: Secondary | ICD-10-CM | POA: Diagnosis not present

## 2022-01-31 DIAGNOSIS — A4151 Sepsis due to Escherichia coli [E. coli]: Secondary | ICD-10-CM | POA: Diagnosis not present

## 2022-01-31 DIAGNOSIS — R5381 Other malaise: Secondary | ICD-10-CM | POA: Diagnosis not present

## 2022-01-31 DIAGNOSIS — J45909 Unspecified asthma, uncomplicated: Secondary | ICD-10-CM | POA: Diagnosis not present

## 2022-01-31 DIAGNOSIS — S3993XA Unspecified injury of pelvis, initial encounter: Secondary | ICD-10-CM | POA: Diagnosis not present

## 2022-01-31 DIAGNOSIS — M5136 Other intervertebral disc degeneration, lumbar region: Secondary | ICD-10-CM | POA: Diagnosis not present

## 2022-01-31 DIAGNOSIS — I480 Paroxysmal atrial fibrillation: Secondary | ICD-10-CM | POA: Diagnosis not present

## 2022-01-31 DIAGNOSIS — K769 Liver disease, unspecified: Secondary | ICD-10-CM | POA: Diagnosis not present

## 2022-01-31 DIAGNOSIS — E785 Hyperlipidemia, unspecified: Secondary | ICD-10-CM | POA: Diagnosis not present

## 2022-01-31 DIAGNOSIS — R2689 Other abnormalities of gait and mobility: Secondary | ICD-10-CM | POA: Diagnosis not present

## 2022-01-31 DIAGNOSIS — M6281 Muscle weakness (generalized): Secondary | ICD-10-CM | POA: Diagnosis not present

## 2022-01-31 DIAGNOSIS — I501 Left ventricular failure: Secondary | ICD-10-CM | POA: Diagnosis not present

## 2022-01-31 DIAGNOSIS — K838 Other specified diseases of biliary tract: Secondary | ICD-10-CM | POA: Diagnosis not present

## 2022-01-31 DIAGNOSIS — E1140 Type 2 diabetes mellitus with diabetic neuropathy, unspecified: Secondary | ICD-10-CM | POA: Diagnosis not present

## 2022-01-31 DIAGNOSIS — E7800 Pure hypercholesterolemia, unspecified: Secondary | ICD-10-CM | POA: Diagnosis not present

## 2022-01-31 DIAGNOSIS — R55 Syncope and collapse: Secondary | ICD-10-CM | POA: Diagnosis not present

## 2022-01-31 DIAGNOSIS — R278 Other lack of coordination: Secondary | ICD-10-CM | POA: Diagnosis not present

## 2022-01-31 DIAGNOSIS — D649 Anemia, unspecified: Secondary | ICD-10-CM | POA: Diagnosis not present

## 2022-01-31 DIAGNOSIS — I509 Heart failure, unspecified: Secondary | ICD-10-CM | POA: Diagnosis not present

## 2022-01-31 DIAGNOSIS — I5030 Unspecified diastolic (congestive) heart failure: Secondary | ICD-10-CM | POA: Diagnosis not present

## 2022-01-31 DIAGNOSIS — I5033 Acute on chronic diastolic (congestive) heart failure: Secondary | ICD-10-CM | POA: Diagnosis not present

## 2022-01-31 DIAGNOSIS — E1142 Type 2 diabetes mellitus with diabetic polyneuropathy: Secondary | ICD-10-CM | POA: Diagnosis not present

## 2022-01-31 DIAGNOSIS — K8309 Other cholangitis: Secondary | ICD-10-CM | POA: Diagnosis not present

## 2022-01-31 DIAGNOSIS — Z7901 Long term (current) use of anticoagulants: Secondary | ICD-10-CM | POA: Diagnosis not present

## 2022-01-31 DIAGNOSIS — Z7982 Long term (current) use of aspirin: Secondary | ICD-10-CM | POA: Diagnosis not present

## 2022-01-31 DIAGNOSIS — Z7401 Bed confinement status: Secondary | ICD-10-CM | POA: Diagnosis not present

## 2022-01-31 DIAGNOSIS — Z7984 Long term (current) use of oral hypoglycemic drugs: Secondary | ICD-10-CM | POA: Diagnosis not present

## 2022-01-31 DIAGNOSIS — Z888 Allergy status to other drugs, medicaments and biological substances status: Secondary | ICD-10-CM | POA: Diagnosis not present

## 2022-01-31 DIAGNOSIS — E089 Diabetes mellitus due to underlying condition without complications: Secondary | ICD-10-CM | POA: Diagnosis not present

## 2022-01-31 DIAGNOSIS — R2681 Unsteadiness on feet: Secondary | ICD-10-CM | POA: Diagnosis not present

## 2022-01-31 DIAGNOSIS — W19XXXA Unspecified fall, initial encounter: Secondary | ICD-10-CM | POA: Diagnosis not present

## 2022-01-31 DIAGNOSIS — S060X0A Concussion without loss of consciousness, initial encounter: Secondary | ICD-10-CM | POA: Diagnosis not present

## 2022-01-31 DIAGNOSIS — R4182 Altered mental status, unspecified: Secondary | ICD-10-CM | POA: Diagnosis not present

## 2022-01-31 DIAGNOSIS — R609 Edema, unspecified: Secondary | ICD-10-CM | POA: Diagnosis not present

## 2022-01-31 DIAGNOSIS — K219 Gastro-esophageal reflux disease without esophagitis: Secondary | ICD-10-CM | POA: Diagnosis not present

## 2022-01-31 DIAGNOSIS — I4891 Unspecified atrial fibrillation: Secondary | ICD-10-CM | POA: Diagnosis not present

## 2022-01-31 DIAGNOSIS — I10 Essential (primary) hypertension: Secondary | ICD-10-CM | POA: Diagnosis not present

## 2022-01-31 DIAGNOSIS — Z79899 Other long term (current) drug therapy: Secondary | ICD-10-CM | POA: Diagnosis not present

## 2022-01-31 DIAGNOSIS — Z1152 Encounter for screening for COVID-19: Secondary | ICD-10-CM | POA: Diagnosis not present

## 2022-02-02 DIAGNOSIS — R2681 Unsteadiness on feet: Secondary | ICD-10-CM | POA: Diagnosis not present

## 2022-02-02 DIAGNOSIS — K219 Gastro-esophageal reflux disease without esophagitis: Secondary | ICD-10-CM | POA: Diagnosis not present

## 2022-02-02 DIAGNOSIS — K8309 Other cholangitis: Secondary | ICD-10-CM | POA: Diagnosis not present

## 2022-02-02 DIAGNOSIS — E1140 Type 2 diabetes mellitus with diabetic neuropathy, unspecified: Secondary | ICD-10-CM | POA: Diagnosis not present

## 2022-02-02 DIAGNOSIS — I4891 Unspecified atrial fibrillation: Secondary | ICD-10-CM | POA: Diagnosis not present

## 2022-02-02 DIAGNOSIS — K838 Other specified diseases of biliary tract: Secondary | ICD-10-CM | POA: Diagnosis not present

## 2022-02-02 DIAGNOSIS — K8050 Calculus of bile duct without cholangitis or cholecystitis without obstruction: Secondary | ICD-10-CM | POA: Diagnosis not present

## 2022-02-02 DIAGNOSIS — I119 Hypertensive heart disease without heart failure: Secondary | ICD-10-CM | POA: Diagnosis not present

## 2022-02-02 DIAGNOSIS — M5136 Other intervertebral disc degeneration, lumbar region: Secondary | ICD-10-CM | POA: Diagnosis not present

## 2022-02-02 DIAGNOSIS — I110 Hypertensive heart disease with heart failure: Secondary | ICD-10-CM | POA: Diagnosis not present

## 2022-02-02 DIAGNOSIS — R5381 Other malaise: Secondary | ICD-10-CM | POA: Diagnosis not present

## 2022-02-02 DIAGNOSIS — I5030 Unspecified diastolic (congestive) heart failure: Secondary | ICD-10-CM | POA: Diagnosis not present

## 2022-02-02 DIAGNOSIS — F32A Depression, unspecified: Secondary | ICD-10-CM | POA: Diagnosis not present

## 2022-02-02 DIAGNOSIS — A498 Other bacterial infections of unspecified site: Secondary | ICD-10-CM | POA: Diagnosis not present

## 2022-02-02 DIAGNOSIS — A419 Sepsis, unspecified organism: Secondary | ICD-10-CM | POA: Diagnosis not present

## 2022-02-02 DIAGNOSIS — M6281 Muscle weakness (generalized): Secondary | ICD-10-CM | POA: Diagnosis not present

## 2022-02-02 DIAGNOSIS — J45909 Unspecified asthma, uncomplicated: Secondary | ICD-10-CM | POA: Diagnosis not present

## 2022-02-02 DIAGNOSIS — I509 Heart failure, unspecified: Secondary | ICD-10-CM | POA: Diagnosis not present

## 2022-02-02 DIAGNOSIS — Z7401 Bed confinement status: Secondary | ICD-10-CM | POA: Diagnosis not present

## 2022-02-02 DIAGNOSIS — D649 Anemia, unspecified: Secondary | ICD-10-CM | POA: Diagnosis not present

## 2022-02-02 DIAGNOSIS — R278 Other lack of coordination: Secondary | ICD-10-CM | POA: Diagnosis not present

## 2022-02-02 DIAGNOSIS — E089 Diabetes mellitus due to underlying condition without complications: Secondary | ICD-10-CM | POA: Diagnosis not present

## 2022-02-02 DIAGNOSIS — E785 Hyperlipidemia, unspecified: Secondary | ICD-10-CM | POA: Diagnosis not present

## 2022-02-02 DIAGNOSIS — K769 Liver disease, unspecified: Secondary | ICD-10-CM | POA: Diagnosis not present

## 2022-02-02 DIAGNOSIS — Z96652 Presence of left artificial knee joint: Secondary | ICD-10-CM | POA: Diagnosis not present

## 2022-02-02 DIAGNOSIS — M1990 Unspecified osteoarthritis, unspecified site: Secondary | ICD-10-CM | POA: Diagnosis not present

## 2022-02-02 DIAGNOSIS — I501 Left ventricular failure: Secondary | ICD-10-CM | POA: Diagnosis not present

## 2022-02-02 DIAGNOSIS — R7881 Bacteremia: Secondary | ICD-10-CM | POA: Diagnosis not present

## 2022-02-02 DIAGNOSIS — Z1612 Extended spectrum beta lactamase (ESBL) resistance: Secondary | ICD-10-CM | POA: Diagnosis not present

## 2022-02-02 DIAGNOSIS — A4151 Sepsis due to Escherichia coli [E. coli]: Secondary | ICD-10-CM | POA: Diagnosis not present

## 2022-02-02 DIAGNOSIS — R2689 Other abnormalities of gait and mobility: Secondary | ICD-10-CM | POA: Diagnosis not present

## 2022-02-02 DIAGNOSIS — K750 Abscess of liver: Secondary | ICD-10-CM | POA: Diagnosis not present

## 2022-02-02 DIAGNOSIS — I5033 Acute on chronic diastolic (congestive) heart failure: Secondary | ICD-10-CM | POA: Diagnosis not present

## 2022-02-04 DIAGNOSIS — I509 Heart failure, unspecified: Secondary | ICD-10-CM | POA: Diagnosis not present

## 2022-02-04 DIAGNOSIS — K8309 Other cholangitis: Secondary | ICD-10-CM | POA: Diagnosis not present

## 2022-02-04 DIAGNOSIS — I4891 Unspecified atrial fibrillation: Secondary | ICD-10-CM | POA: Diagnosis not present

## 2022-02-04 DIAGNOSIS — I119 Hypertensive heart disease without heart failure: Secondary | ICD-10-CM | POA: Diagnosis not present

## 2022-02-12 DIAGNOSIS — K750 Abscess of liver: Secondary | ICD-10-CM | POA: Diagnosis not present

## 2022-02-12 DIAGNOSIS — A498 Other bacterial infections of unspecified site: Secondary | ICD-10-CM | POA: Diagnosis not present

## 2022-02-12 DIAGNOSIS — Z96652 Presence of left artificial knee joint: Secondary | ICD-10-CM | POA: Diagnosis not present

## 2022-02-12 DIAGNOSIS — K8050 Calculus of bile duct without cholangitis or cholecystitis without obstruction: Secondary | ICD-10-CM | POA: Diagnosis not present

## 2022-02-12 DIAGNOSIS — K8309 Other cholangitis: Secondary | ICD-10-CM | POA: Diagnosis not present

## 2022-02-12 DIAGNOSIS — Z1612 Extended spectrum beta lactamase (ESBL) resistance: Secondary | ICD-10-CM | POA: Diagnosis not present

## 2022-02-12 DIAGNOSIS — A419 Sepsis, unspecified organism: Secondary | ICD-10-CM | POA: Diagnosis not present

## 2022-02-12 DIAGNOSIS — R7881 Bacteremia: Secondary | ICD-10-CM | POA: Diagnosis not present

## 2022-03-02 DIAGNOSIS — D649 Anemia, unspecified: Secondary | ICD-10-CM | POA: Diagnosis not present

## 2022-03-02 DIAGNOSIS — Z7901 Long term (current) use of anticoagulants: Secondary | ICD-10-CM | POA: Diagnosis not present

## 2022-03-02 DIAGNOSIS — W19XXXD Unspecified fall, subsequent encounter: Secondary | ICD-10-CM | POA: Diagnosis not present

## 2022-03-02 DIAGNOSIS — Z7984 Long term (current) use of oral hypoglycemic drugs: Secondary | ICD-10-CM | POA: Diagnosis not present

## 2022-03-02 DIAGNOSIS — K838 Other specified diseases of biliary tract: Secondary | ICD-10-CM | POA: Diagnosis not present

## 2022-03-02 DIAGNOSIS — I4891 Unspecified atrial fibrillation: Secondary | ICD-10-CM | POA: Diagnosis not present

## 2022-03-02 DIAGNOSIS — I509 Heart failure, unspecified: Secondary | ICD-10-CM | POA: Diagnosis not present

## 2022-03-02 DIAGNOSIS — S0990XD Unspecified injury of head, subsequent encounter: Secondary | ICD-10-CM | POA: Diagnosis not present

## 2022-03-02 DIAGNOSIS — E119 Type 2 diabetes mellitus without complications: Secondary | ICD-10-CM | POA: Diagnosis not present

## 2022-03-02 DIAGNOSIS — I110 Hypertensive heart disease with heart failure: Secondary | ICD-10-CM | POA: Diagnosis not present

## 2022-03-03 ENCOUNTER — Encounter: Payer: PPO | Admitting: Internal Medicine

## 2022-03-08 DIAGNOSIS — N281 Cyst of kidney, acquired: Secondary | ICD-10-CM | POA: Diagnosis not present

## 2022-03-08 DIAGNOSIS — I10 Essential (primary) hypertension: Secondary | ICD-10-CM | POA: Diagnosis not present

## 2022-03-08 DIAGNOSIS — R531 Weakness: Secondary | ICD-10-CM | POA: Diagnosis not present

## 2022-03-08 DIAGNOSIS — U071 COVID-19: Secondary | ICD-10-CM | POA: Diagnosis not present

## 2022-03-08 DIAGNOSIS — I700 Atherosclerosis of aorta: Secondary | ICD-10-CM | POA: Diagnosis not present

## 2022-03-08 DIAGNOSIS — A419 Sepsis, unspecified organism: Secondary | ICD-10-CM | POA: Diagnosis not present

## 2022-03-08 DIAGNOSIS — R4182 Altered mental status, unspecified: Secondary | ICD-10-CM | POA: Diagnosis not present

## 2022-03-08 DIAGNOSIS — R410 Disorientation, unspecified: Secondary | ICD-10-CM | POA: Diagnosis not present

## 2022-03-08 DIAGNOSIS — I447 Left bundle-branch block, unspecified: Secondary | ICD-10-CM | POA: Diagnosis not present

## 2022-03-09 DIAGNOSIS — Z888 Allergy status to other drugs, medicaments and biological substances status: Secondary | ICD-10-CM | POA: Diagnosis not present

## 2022-03-09 DIAGNOSIS — B965 Pseudomonas (aeruginosa) (mallei) (pseudomallei) as the cause of diseases classified elsewhere: Secondary | ICD-10-CM | POA: Diagnosis not present

## 2022-03-09 DIAGNOSIS — N390 Urinary tract infection, site not specified: Secondary | ICD-10-CM | POA: Diagnosis not present

## 2022-03-09 DIAGNOSIS — M1990 Unspecified osteoarthritis, unspecified site: Secondary | ICD-10-CM | POA: Diagnosis not present

## 2022-03-09 DIAGNOSIS — K769 Liver disease, unspecified: Secondary | ICD-10-CM | POA: Diagnosis not present

## 2022-03-09 DIAGNOSIS — I110 Hypertensive heart disease with heart failure: Secondary | ICD-10-CM | POA: Diagnosis not present

## 2022-03-09 DIAGNOSIS — J45909 Unspecified asthma, uncomplicated: Secondary | ICD-10-CM | POA: Diagnosis not present

## 2022-03-09 DIAGNOSIS — Z7901 Long term (current) use of anticoagulants: Secondary | ICD-10-CM | POA: Diagnosis not present

## 2022-03-09 DIAGNOSIS — U071 COVID-19: Secondary | ICD-10-CM | POA: Diagnosis not present

## 2022-03-09 DIAGNOSIS — R410 Disorientation, unspecified: Secondary | ICD-10-CM | POA: Diagnosis not present

## 2022-03-09 DIAGNOSIS — E119 Type 2 diabetes mellitus without complications: Secondary | ICD-10-CM | POA: Diagnosis not present

## 2022-03-09 DIAGNOSIS — I10 Essential (primary) hypertension: Secondary | ICD-10-CM | POA: Diagnosis not present

## 2022-03-09 DIAGNOSIS — D649 Anemia, unspecified: Secondary | ICD-10-CM | POA: Diagnosis not present

## 2022-03-09 DIAGNOSIS — I5032 Chronic diastolic (congestive) heart failure: Secondary | ICD-10-CM | POA: Diagnosis not present

## 2022-03-09 DIAGNOSIS — Z7984 Long term (current) use of oral hypoglycemic drugs: Secondary | ICD-10-CM | POA: Diagnosis not present

## 2022-03-09 DIAGNOSIS — Z79899 Other long term (current) drug therapy: Secondary | ICD-10-CM | POA: Diagnosis not present

## 2022-03-09 DIAGNOSIS — I700 Atherosclerosis of aorta: Secondary | ICD-10-CM | POA: Diagnosis not present

## 2022-03-09 DIAGNOSIS — I480 Paroxysmal atrial fibrillation: Secondary | ICD-10-CM | POA: Diagnosis not present

## 2022-03-09 DIAGNOSIS — A419 Sepsis, unspecified organism: Secondary | ICD-10-CM | POA: Diagnosis not present

## 2022-03-09 DIAGNOSIS — R4182 Altered mental status, unspecified: Secondary | ICD-10-CM | POA: Diagnosis not present

## 2022-03-09 DIAGNOSIS — Z885 Allergy status to narcotic agent status: Secondary | ICD-10-CM | POA: Diagnosis not present

## 2022-03-09 DIAGNOSIS — I447 Left bundle-branch block, unspecified: Secondary | ICD-10-CM | POA: Diagnosis not present

## 2022-03-09 DIAGNOSIS — E7800 Pure hypercholesterolemia, unspecified: Secondary | ICD-10-CM | POA: Diagnosis not present

## 2022-03-09 DIAGNOSIS — G9341 Metabolic encephalopathy: Secondary | ICD-10-CM | POA: Diagnosis not present

## 2022-03-09 DIAGNOSIS — N281 Cyst of kidney, acquired: Secondary | ICD-10-CM | POA: Diagnosis not present

## 2022-03-09 DIAGNOSIS — F32A Depression, unspecified: Secondary | ICD-10-CM | POA: Diagnosis not present

## 2022-03-10 ENCOUNTER — Ambulatory Visit: Payer: PPO | Admitting: Internal Medicine

## 2022-03-23 DIAGNOSIS — J45909 Unspecified asthma, uncomplicated: Secondary | ICD-10-CM | POA: Diagnosis not present

## 2022-03-23 DIAGNOSIS — M25551 Pain in right hip: Secondary | ICD-10-CM | POA: Diagnosis not present

## 2022-03-23 DIAGNOSIS — R55 Syncope and collapse: Secondary | ICD-10-CM | POA: Diagnosis not present

## 2022-03-23 DIAGNOSIS — Z87440 Personal history of urinary (tract) infections: Secondary | ICD-10-CM | POA: Diagnosis not present

## 2022-03-23 DIAGNOSIS — D72829 Elevated white blood cell count, unspecified: Secondary | ICD-10-CM | POA: Diagnosis not present

## 2022-03-23 DIAGNOSIS — D649 Anemia, unspecified: Secondary | ICD-10-CM | POA: Diagnosis not present

## 2022-03-23 DIAGNOSIS — F32A Depression, unspecified: Secondary | ICD-10-CM | POA: Diagnosis not present

## 2022-03-23 DIAGNOSIS — R000 Tachycardia, unspecified: Secondary | ICD-10-CM | POA: Diagnosis not present

## 2022-03-23 DIAGNOSIS — R296 Repeated falls: Secondary | ICD-10-CM | POA: Diagnosis not present

## 2022-03-23 DIAGNOSIS — M25572 Pain in left ankle and joints of left foot: Secondary | ICD-10-CM | POA: Diagnosis not present

## 2022-03-23 DIAGNOSIS — B9620 Unspecified Escherichia coli [E. coli] as the cause of diseases classified elsewhere: Secondary | ICD-10-CM | POA: Diagnosis not present

## 2022-03-23 DIAGNOSIS — Z79899 Other long term (current) drug therapy: Secondary | ICD-10-CM | POA: Diagnosis not present

## 2022-03-23 DIAGNOSIS — N390 Urinary tract infection, site not specified: Secondary | ICD-10-CM | POA: Diagnosis not present

## 2022-03-23 DIAGNOSIS — Z1624 Resistance to multiple antibiotics: Secondary | ICD-10-CM | POA: Diagnosis not present

## 2022-03-23 DIAGNOSIS — M47812 Spondylosis without myelopathy or radiculopathy, cervical region: Secondary | ICD-10-CM | POA: Diagnosis not present

## 2022-03-23 DIAGNOSIS — G319 Degenerative disease of nervous system, unspecified: Secondary | ICD-10-CM | POA: Diagnosis not present

## 2022-03-23 DIAGNOSIS — M1990 Unspecified osteoarthritis, unspecified site: Secondary | ICD-10-CM | POA: Diagnosis not present

## 2022-03-23 DIAGNOSIS — E119 Type 2 diabetes mellitus without complications: Secondary | ICD-10-CM | POA: Diagnosis not present

## 2022-03-23 DIAGNOSIS — I6782 Cerebral ischemia: Secondary | ICD-10-CM | POA: Diagnosis not present

## 2022-03-23 DIAGNOSIS — R42 Dizziness and giddiness: Secondary | ICD-10-CM | POA: Diagnosis not present

## 2022-03-23 DIAGNOSIS — Z888 Allergy status to other drugs, medicaments and biological substances status: Secondary | ICD-10-CM | POA: Diagnosis not present

## 2022-03-23 DIAGNOSIS — I110 Hypertensive heart disease with heart failure: Secondary | ICD-10-CM | POA: Diagnosis not present

## 2022-03-23 DIAGNOSIS — I5032 Chronic diastolic (congestive) heart failure: Secondary | ICD-10-CM | POA: Diagnosis not present

## 2022-03-23 DIAGNOSIS — Z7901 Long term (current) use of anticoagulants: Secondary | ICD-10-CM | POA: Diagnosis not present

## 2022-03-23 DIAGNOSIS — I700 Atherosclerosis of aorta: Secondary | ICD-10-CM | POA: Diagnosis not present

## 2022-03-23 DIAGNOSIS — Z7984 Long term (current) use of oral hypoglycemic drugs: Secondary | ICD-10-CM | POA: Diagnosis not present

## 2022-03-23 DIAGNOSIS — S0990XA Unspecified injury of head, initial encounter: Secondary | ICD-10-CM | POA: Diagnosis not present

## 2022-03-23 DIAGNOSIS — M40204 Unspecified kyphosis, thoracic region: Secondary | ICD-10-CM | POA: Diagnosis not present

## 2022-03-23 DIAGNOSIS — E7800 Pure hypercholesterolemia, unspecified: Secondary | ICD-10-CM | POA: Diagnosis not present

## 2022-03-23 DIAGNOSIS — N179 Acute kidney failure, unspecified: Secondary | ICD-10-CM | POA: Diagnosis not present

## 2022-03-23 DIAGNOSIS — I447 Left bundle-branch block, unspecified: Secondary | ICD-10-CM | POA: Diagnosis not present

## 2022-03-23 DIAGNOSIS — I2510 Atherosclerotic heart disease of native coronary artery without angina pectoris: Secondary | ICD-10-CM | POA: Diagnosis not present

## 2022-03-23 DIAGNOSIS — E860 Dehydration: Secondary | ICD-10-CM | POA: Diagnosis not present

## 2022-03-24 ENCOUNTER — Inpatient Hospital Stay: Payer: PPO | Admitting: Internal Medicine

## 2022-03-24 ENCOUNTER — Telehealth: Payer: Self-pay

## 2022-03-24 NOTE — Patient Outreach (Signed)
  Care Coordination   Initial Visit Note   03/24/2022 Name: Jeanette [name] MRN: [id number] DOB: 07/21/1940  Jeanette [name] is a 81 y.o. year old female who sees Jeanette Roger, MD for primary care. I spoke with  Jeanette [name] by phone today.  What matters to the patients health and wellness today?  Placed call to patient today and daughter answered.  Jeanette [name]) Daughter reports that patient is at [facility] for a UTI and fall. Daughter reports that patient recently got out of rehab and the rehab took out her PICC line without consulting infectious disease. Daughter reports that patient has another UTI.  Fall last night and hit her head.  Daughter reports weakness and dehydration.  Daughter request assistance moving forward and wants phone call in 1 week.  Appointment booked.   SDOH assessments and interventions completed:  No     Care Coordination Interventions:  Yes, provided   Follow up plan: Follow up call scheduled for 03/31/2022    Encounter Outcome:  Pt. Visit Completed   Jeanette Rand, RN, BSN, CEN [place] Coordinator [phone]

## 2022-03-29 ENCOUNTER — Ambulatory Visit: Payer: Self-pay

## 2022-03-29 ENCOUNTER — Ambulatory Visit: Payer: PPO | Admitting: Internal Medicine

## 2022-03-29 ENCOUNTER — Encounter: Payer: Self-pay | Admitting: Internal Medicine

## 2022-03-29 ENCOUNTER — Other Ambulatory Visit: Payer: Self-pay

## 2022-03-29 VITALS — BP 108/52 | HR 99 | Temp 97.9°F | Resp 16 | Ht 65.0 in | Wt 194.0 lb

## 2022-03-29 DIAGNOSIS — N390 Urinary tract infection, site not specified: Secondary | ICD-10-CM | POA: Diagnosis not present

## 2022-03-29 DIAGNOSIS — K750 Abscess of liver: Secondary | ICD-10-CM | POA: Insufficient documentation

## 2022-03-29 DIAGNOSIS — R5381 Other malaise: Secondary | ICD-10-CM

## 2022-03-29 MED ORDER — AMLODIPINE BESYLATE 10 MG PO TABS: 10.0000 mg | ORAL_TABLET | Freq: Every day | ORAL | 1 refills | Status: DC

## 2022-03-29 MED ORDER — METOPROLOL SUCCINATE ER 25 MG PO TB24: 25.0000 mg | ORAL_TABLET | Freq: Every day | ORAL | 1 refills | Status: DC

## 2022-03-29 MED ORDER — POTASSIUM CHLORIDE CRYS ER 20 MEQ PO TBCR: 20.0000 meq | EXTENDED_RELEASE_TABLET | Freq: Every day | ORAL | 1 refills | Status: DC

## 2022-03-29 MED ORDER — GABAPENTIN 100 MG PO CAPS: 100.0000 mg | ORAL_CAPSULE | Freq: Two times a day (BID) | ORAL | 1 refills | Status: DC

## 2022-03-29 MED ORDER — SIMVASTATIN 40 MG PO TABS: 40.0000 mg | ORAL_TABLET | Freq: Every day | ORAL | 5 refills | Status: DC

## 2022-03-29 MED ORDER — APIXABAN 5 MG PO TABS: 5.0000 mg | ORAL_TABLET | Freq: Two times a day (BID) | ORAL | 5 refills | Status: DC

## 2022-03-29 MED ORDER — LISINOPRIL 40 MG PO TABS: 40.0000 mg | ORAL_TABLET | Freq: Every day | ORAL | 1 refills | Status: DC

## 2022-03-29 MED ORDER — CEPHALEXIN 500 MG PO CAPS: 500.0000 mg | ORAL_CAPSULE | Freq: Two times a day (BID) | ORAL | 5 refills | Status: DC

## 2022-03-29 MED ORDER — METFORMIN HCL 500 MG PO TABS: 1000.0000 mg | ORAL_TABLET | Freq: Two times a day (BID) | ORAL | 1 refills | Status: DC

## 2022-03-29 NOTE — Patient Outreach (Signed)
  Care Coordination   Follow Up Visit Note   03/29/2022 Name: Jeanette [name] MRN: [id number] DOB: 03-26-1941  KEVIN MARIO is a 81 y.o. year old female who sees Townsend Roger, MD for primary care. I spoke with  Markham Jordan by phone today. Spoke with daughter, Genella Mech.   What matters to the patients health and wellness today?  Called daughter for a scheduled appointment and daughter reports she was able to get her mom into Dr. Ebbie Ridge office today and she needed me to call back. I called back at 4pm as agreed with no answer and I left a message for daughter to call me back.       SDOH assessments and interventions completed:  No     Care Coordination Interventions:  No, not indicated   Follow up plan:  awaiting call back from daughter    Encounter Outcome:  Pt. Visit Completed   Tomasa Rand, RN, BSN, CEN [place] Coordinator [phone]

## 2022-03-29 NOTE — Assessment & Plan Note (Addendum)
She may need a repeat MRI or CT scan of her abd/pelvis.  She had residual abscess seen on her CT scan from admission from 11/21-11/24.  I tried calling ID from [place] and left a message.  I want her to have repeat imaging and have antibiotics if necessary.

## 2022-03-29 NOTE — Assessment & Plan Note (Signed)
She was supposed to have homehealth PT come to the home but they have not heard from them.  We will call and arrange this.

## 2022-03-29 NOTE — Progress Notes (Signed)
Office Visit  Subjective   Patient ID: TANISE RUSSMAN   DOB: 1941/01/07   Age: 81 y.o.   MRN: [id number]   Chief Complaint Chief Complaint  Patient presents with   Hospitalization [facility] follow up      History of Present Illness Mrs. Maddison is a 81 yo female who has had multiple hospital admissions over the last 3 months.  She was discharged from [facility] on 02/02/2022 where she was sent from [place] [facility] [place] with a fall.  She had no injury but on xrays she had hypoxia and pulmonary edema with CHF exacerbation.  She was given lasix and continued on antibiotics for a history of an intrabdominal infection in 12/2021 where she had sepsis with ascending cholangitis.  They felt she had a minor concussion at that time.  She was discharged from [facility] on 03/12/2022 where she was previously at home where she presented with confusion.  She was diagnosed with a UTI and COVID-19 at that time and a repeat CT scan of her abd/pelvis which was significant for liver lesions.  She was supposed to see infectious disease as an outpatient but they would not see her until they got the results of her repeat CT of abd/pelvis.  She was sent home and unfortunately she got dizzy and unsteady on her feet and fell.  She was admitted to the hospital from 12/5 until 12/9 where she discovered to have orthostasis and UTI.  They felt she had a partially treated UTI and gave her antibiotics and IVF's for dehydration.  They felt she was still weak and they wanted her to be admitted back to short term rehab on 03/23/2022 but they were not able to get her placed.  The patient was sent home on bactrim which they want her to take for 7 more days.  Again, she her cholangitis with areas of hepatic infection where she is s/p ERCP with cultures growing out ESBL E. Coli where she had a PICC line placed and she was placed on antibiotics with Invanz.  She had her PICC line removed on 02/26/2022 and her family is concerned that she never had a  repeat CT scan of her abd/pelvis as asked by [facility] ID where they wanted to see if they need a longer course of antibiotics.  The family states that this scan was not done and she did not see ID and they just pulled her PICC Line out at [place] on 02/26/2022 and sent her home.  I reviewed EPIC notes and she was seen by Dr. Caryn Section on 02/13/2022 in ID. Her CT abdomen pelvis with contrast at [facility] in 12/2021 revealed ill-defined lesion within liver 5.0 x 4.2 x 5.8 cm question metastasis versus primary tumor.  Mild extrahepatic biliary dilatation probable 4 mm diameter calculus in the distal CBD.  The patient was transferred to [facility] on 01/13/2022  for concern of possible ascending cholangitis and evaluation by GI for possible ERCP.  A MRI of the abdomen on 9/28 had a dominant masslike structure in the posterior right hepatic lobe with 3 additional similar-appearing lesions also identified in the right hepatic lobe, similar in appearance to the prior outside CT scan.   She had sepsis with bacteremia with cholangitis and choledocholithiasis with liver abscess.  Today, her only complaints is intermittent nausea and poor food intake.     Past Medical History Past Medical History:  Diagnosis Date   Abscess of lower back 09/13/2016   Allergic rhinitis  Anemia 11/11/2017   Last Assessment & Plan:  Formatting of this note is different from the original. Lab Results  Component Value Date   WBC 5.8 11/16/2017   HGB 8.9 (L) 11/16/2017   HCT 27.9 (L) 11/16/2017   MCV 78.1 (L) 11/16/2017   PLT 264 11/16/2017   -Transfuse hemoglobin less than 7 - Continue iron supplement   Aortic atherosclerosis (HCC)    Arthralgia of hip 03/01/2017   Asthma    Colon polyp    Depressive disorder    Diabetic neuropathy (HCC)    DM2 (diabetes mellitus, type 2) ([place]) 09/13/2016   Encephalopathy 09/13/2016   Fat necrosis of skin 09/13/2016   High cholesterol    History of lumbar fusion 12/06/2017   History of surgical site infection  08/10/2017   Formatting of this note might be different from the original. Added automatically from request for surgery [id number]   History of total left knee replacement 05/12/2018   HTN (hypertension) 09/13/2016   Hyperlipemia    Impaired ambulation 11/14/2017   Last Assessment & Plan:  Formatting of this note might be different from the original. PT eval PT D/C Recs: 24/7 s/PRN a and HHPT. If appropriate assistance not available at home, rec inpt post acute therapy/low intensity prior to D/C home.   Infected orthopedic implant, subsequent encounter 05/12/2018   Lumbar degenerative disc disease 03/01/2017   Lumbar radiculopathy 03/01/2017   Obesity    Osteoarthritis    Respiratory failure, acute ([place]) 08/29/2016   Vertebral osteomyelitis ([place]) 11/10/2017   Last Assessment & Plan:  Formatting of this note might be different from the original. Osteomyelitis at L5 and T12 seen on MRI on 7/25  PLAN  -  OSH cultures growing Staph Ludgunensis, oxacillin susceptible. ABX adjusted ID consulted, appreciate recs  -  Oxacillin and Rifampin until 9/15  Ortho consulted - concerned for acute OM - no surgical intervention - IV abx x 6 weeks   IR consulted  - CT gu     Allergies Allergies  Allergen Reactions   Adenosine    Lidocaine Other (See Comments)    Reaction not recalled   Metoprolol Other (See Comments)    Reaction not recalled   Oxycodone Other (See Comments)    Overly-sedates the patient   Tramadol Itching   Cefepime Nausea And Vomiting    Tolerates oxacillin  Brand name for Maxipime        Rifampin     vomiting       Review of Systems Review of Systems  Constitutional:  Negative for chills and fever.  Respiratory:  Negative for cough and shortness of breath.   Cardiovascular:  Negative for chest pain, palpitations and leg swelling.  Gastrointestinal:  Negative for abdominal pain, constipation, diarrhea, nausea and vomiting.  Genitourinary:  Negative for dysuria, frequency and  hematuria.  Skin:  Negative for itching and rash.  Neurological:  Negative for dizziness, weakness and headaches.       Objective:    Vitals BP (!) 108/52   Pulse 99   Temp 97.9 F (36.6 C)   Resp 16   Ht '5\' 5"'$  (1.651 m)   Wt 194 lb (88 kg)   SpO2 91%   BMI 32.28 kg/m    Physical Examination Physical Exam Constitutional:      Appearance: Normal appearance. She is not ill-appearing.  Cardiovascular:     Rate and Rhythm: Normal rate and regular rhythm.     Pulses: Normal pulses.  Heart sounds: No murmur heard.    No friction rub. No gallop.  Pulmonary:     Effort: Pulmonary effort is normal. No respiratory distress.     Breath sounds: Normal breath sounds. No wheezing, rhonchi or rales.  Abdominal:     General: Abdomen is flat. Bowel sounds are normal. There is no distension.     Palpations: Abdomen is soft.     Tenderness: There is no abdominal tenderness.  Musculoskeletal:     Right lower leg: No edema.     Left lower leg: No edema.  Skin:    General: Skin is warm and dry.     Findings: No rash.  Neurological:     Mental Status: She is alert.        Assessment & Plan:   Liver abscess She may need a repeat MRI or CT scan of her abd/pelvis.  She had residual abscess seen on her CT scan from admission from 11/21-11/24.  I tried calling ID from [facility] [facility] and left a message.  I want her to have repeat imaging and have antibiotics if necessary.  Debility She was supposed to have homehealth PT come to the home but they have not heard from them.  We will call and arrange this.    Return in about 4 weeks (around 04/26/2022).   Townsend Roger, MD

## 2022-03-30 DIAGNOSIS — N179 Acute kidney failure, unspecified: Secondary | ICD-10-CM | POA: Diagnosis not present

## 2022-03-30 DIAGNOSIS — I4891 Unspecified atrial fibrillation: Secondary | ICD-10-CM | POA: Diagnosis not present

## 2022-03-30 DIAGNOSIS — D649 Anemia, unspecified: Secondary | ICD-10-CM | POA: Diagnosis not present

## 2022-03-30 DIAGNOSIS — J1282 Pneumonia due to coronavirus disease 2019: Secondary | ICD-10-CM | POA: Diagnosis not present

## 2022-03-30 DIAGNOSIS — R7881 Bacteremia: Secondary | ICD-10-CM | POA: Diagnosis not present

## 2022-03-30 DIAGNOSIS — I5032 Chronic diastolic (congestive) heart failure: Secondary | ICD-10-CM | POA: Diagnosis not present

## 2022-03-30 DIAGNOSIS — E7800 Pure hypercholesterolemia, unspecified: Secondary | ICD-10-CM | POA: Diagnosis not present

## 2022-03-30 DIAGNOSIS — F32A Depression, unspecified: Secondary | ICD-10-CM | POA: Diagnosis not present

## 2022-03-30 DIAGNOSIS — E119 Type 2 diabetes mellitus without complications: Secondary | ICD-10-CM | POA: Diagnosis not present

## 2022-03-30 DIAGNOSIS — N281 Cyst of kidney, acquired: Secondary | ICD-10-CM | POA: Diagnosis not present

## 2022-03-30 DIAGNOSIS — B961 Klebsiella pneumoniae [K. pneumoniae] as the cause of diseases classified elsewhere: Secondary | ICD-10-CM | POA: Diagnosis not present

## 2022-03-30 DIAGNOSIS — I110 Hypertensive heart disease with heart failure: Secondary | ICD-10-CM | POA: Diagnosis not present

## 2022-03-30 DIAGNOSIS — I700 Atherosclerosis of aorta: Secondary | ICD-10-CM | POA: Diagnosis not present

## 2022-03-30 DIAGNOSIS — K8309 Other cholangitis: Secondary | ICD-10-CM | POA: Diagnosis not present

## 2022-03-30 DIAGNOSIS — U071 COVID-19: Secondary | ICD-10-CM | POA: Diagnosis not present

## 2022-03-30 DIAGNOSIS — I2510 Atherosclerotic heart disease of native coronary artery without angina pectoris: Secondary | ICD-10-CM | POA: Diagnosis not present

## 2022-03-30 DIAGNOSIS — Z9181 History of falling: Secondary | ICD-10-CM | POA: Diagnosis not present

## 2022-03-30 DIAGNOSIS — M1990 Unspecified osteoarthritis, unspecified site: Secondary | ICD-10-CM | POA: Diagnosis not present

## 2022-03-30 DIAGNOSIS — E860 Dehydration: Secondary | ICD-10-CM | POA: Diagnosis not present

## 2022-03-30 DIAGNOSIS — B9620 Unspecified Escherichia coli [E. coli] as the cause of diseases classified elsewhere: Secondary | ICD-10-CM | POA: Diagnosis not present

## 2022-03-30 DIAGNOSIS — N390 Urinary tract infection, site not specified: Secondary | ICD-10-CM | POA: Diagnosis not present

## 2022-03-30 DIAGNOSIS — M47813 Spondylosis without myelopathy or radiculopathy, cervicothoracic region: Secondary | ICD-10-CM | POA: Diagnosis not present

## 2022-04-05 ENCOUNTER — Inpatient Hospital Stay: Payer: PPO | Admitting: Internal Medicine

## 2022-04-08 ENCOUNTER — Ambulatory Visit: Payer: Self-pay

## 2022-04-08 NOTE — Patient Outreach (Signed)
  Care Coordination   Follow Up Visit Note   04/08/2022 Name: Jeanette [name] MRN: [id number] DOB: 07-Jul-1940  Jeanette [name] is a 81 y.o. year old female who sees Townsend Roger, MD for primary care. I spoke with  Velda Shell daughter [name] by phone today.  What matters to the patients health and wellness today?  Daughter states she is waiting to get patient a CT and labs for evaluation of abscess.  Daughter reports that patient had PCP follow up last week and still waiting for labs and CT to be ordered. Reports she has called the MD office several times.     Goals Addressed               This Visit's Progress     Need help getting CT and labs scheduled (pt-stated)        Care Coordination Interventions: Assessed concern from daughter about need for imaging and labs.  MD called [facility] and left a message on 03/29/2022. Update on patients condition:  daughter reports that patient is doing pretty well. Reports she has all patients medications.  Patient is still taking bactrim.  Daughter reports patient is active with home health PT.  Continues to be weak. Secure chat and in basket sent to MD due to the office being closed today at 12noon.          SDOH assessments and interventions completed:  No     Care Coordination Interventions:  Yes, provided   Follow up plan: Follow up call scheduled for after I am able to reach MD    Encounter Outcome:  Pt. Visit Completed   Tomasa Rand, RN, BSN, CEN [place] Coordinator [phone]

## 2022-04-09 ENCOUNTER — Telehealth: Payer: Self-pay

## 2022-04-09 NOTE — Patient Outreach (Signed)
  Care Coordination   Follow Up Visit Note   04/09/2022 Name: SIMA LINDENBERGER MRN: [id number] DOB: 1941/01/16  CORIANN BROUHARD is a 81 y.o. year old female who sees Townsend Roger, MD for primary care. I engaged with Markham Jordan in the providers office today.  What matters to the patients health and wellness today?  Placed call to MD office to get an update on imaging and labs.  No response as of yet. Placed call to daughter to update.    Goals Addressed               This Visit's Progress     Need help getting CT and labs scheduled (pt-stated)        Care Coordination Interventions: Call to MD  office to request call back and assistance. Placed call to daughter and updated daughter awaiting response from MD          SDOH assessments and interventions completed:  No     Care Coordination Interventions:  Yes, provided   Follow up plan: Follow up call scheduled for 04/14/2022    Encounter Outcome:  Pt. Visit Completed   .acs

## 2022-04-13 DIAGNOSIS — R55 Syncope and collapse: Secondary | ICD-10-CM | POA: Diagnosis not present

## 2022-04-13 DIAGNOSIS — I441 Atrioventricular block, second degree: Secondary | ICD-10-CM | POA: Diagnosis not present

## 2022-04-13 DIAGNOSIS — R9431 Abnormal electrocardiogram [ECG] [EKG]: Secondary | ICD-10-CM | POA: Diagnosis not present

## 2022-04-13 DIAGNOSIS — E041 Nontoxic single thyroid nodule: Secondary | ICD-10-CM | POA: Diagnosis not present

## 2022-04-13 DIAGNOSIS — N3000 Acute cystitis without hematuria: Secondary | ICD-10-CM | POA: Diagnosis not present

## 2022-04-13 DIAGNOSIS — M4802 Spinal stenosis, cervical region: Secondary | ICD-10-CM | POA: Diagnosis not present

## 2022-04-13 DIAGNOSIS — I10 Essential (primary) hypertension: Secondary | ICD-10-CM | POA: Diagnosis not present

## 2022-04-13 DIAGNOSIS — R220 Localized swelling, mass and lump, head: Secondary | ICD-10-CM | POA: Diagnosis not present

## 2022-04-13 DIAGNOSIS — E860 Dehydration: Secondary | ICD-10-CM | POA: Diagnosis not present

## 2022-04-13 DIAGNOSIS — W19XXXA Unspecified fall, initial encounter: Secondary | ICD-10-CM | POA: Diagnosis not present

## 2022-04-13 DIAGNOSIS — D3150 Benign neoplasm of unspecified lacrimal gland and duct: Secondary | ICD-10-CM | POA: Diagnosis not present

## 2022-04-13 DIAGNOSIS — H5789 Other specified disorders of eye and adnexa: Secondary | ICD-10-CM | POA: Diagnosis not present

## 2022-04-13 DIAGNOSIS — S0003XA Contusion of scalp, initial encounter: Secondary | ICD-10-CM | POA: Diagnosis not present

## 2022-04-13 DIAGNOSIS — I517 Cardiomegaly: Secondary | ICD-10-CM | POA: Diagnosis not present

## 2022-04-13 DIAGNOSIS — G4489 Other headache syndrome: Secondary | ICD-10-CM | POA: Diagnosis not present

## 2022-04-13 DIAGNOSIS — Z20822 Contact with and (suspected) exposure to covid-19: Secondary | ICD-10-CM | POA: Diagnosis not present

## 2022-04-14 ENCOUNTER — Ambulatory Visit: Payer: Self-pay

## 2022-04-14 NOTE — Patient Outreach (Signed)
  Care Coordination   Follow Up Visit Note   04/14/2022 Name: JACKLINE CASTILLA MRN: [id number] DOB: 28-Apr-1940  SHANTASIA HUNNELL is a 81 y.o. year old female who sees Townsend Roger, MD for primary care. I spoke with  Markham Jordan by phone today. Spoke with [name], daughter  What matters to the patients health and wellness today?  Placed follow up call to daughter today. Daughter reports patient past out yesterday and ended up in the emergency department with ongoing UTI.  Was prescribed Cipro and discharged from [facility]. PCP ordered CT on 04/09/2022 and it is scheduled for 04/16/2022.   Per daughter [facility] refused to do CT while patient in the ED yesterday. Daughter has been in touch with MD office.      Goals Addressed               This Visit's Progress     Need help getting CT and labs scheduled (pt-stated)        Care Coordination Interventions: No response from PCP Ordered placed by PCP on 04/09/2022  per daughter and schedule on 04/16/2022 at [facility].  Daughter reports she has spoken with Baxter Flattery the scheduler. Will plan follow up in 1 week,. Daughter reports RX for cipro to be delivered today.         SDOH assessments and interventions completed:  No     Care Coordination Interventions:  Yes, provided   Follow up plan: Follow up call scheduled for 04/20/2022    Encounter Outcome:  Pt. Visit Completed   Tomasa Rand, RN, BSN, CEN [place] Coordinator [phone]

## 2022-04-16 DIAGNOSIS — K750 Abscess of liver: Secondary | ICD-10-CM | POA: Diagnosis not present

## 2022-04-16 DIAGNOSIS — K769 Liver disease, unspecified: Secondary | ICD-10-CM | POA: Diagnosis not present

## 2022-04-16 DIAGNOSIS — N281 Cyst of kidney, acquired: Secondary | ICD-10-CM | POA: Diagnosis not present

## 2022-04-21 ENCOUNTER — Ambulatory Visit: Payer: Self-pay

## 2022-04-21 NOTE — Patient Outreach (Signed)
  Care Coordination   Follow Up Visit Note   04/21/2022 Name: Jeanette [name] MRN: [id number] DOB: [birth year]  Jeanette [name] is a 82 y.o. year old female who sees Townsend Roger, MD for primary care. I spoke with  Velda Shell daughter by phone today. [name]  What matters to the patients health and wellness today?  Placed call to daughter to follow up to see if CT got completed.  Daughter states that CT was done. Daughter is waiting for results. Reports that she is waiting for results.  Currently patient is being treated for a UTI with Cipro.     Goals Addressed               This Visit's Progress     Need help getting CT and labs scheduled (pt-stated)        Care Coordination Interventions: CT completed on 04/16/2022.  Awaiting results.  Offered support Provided a listening ear.  Daughter in touch with office staff.                    SDOH assessments and interventions completed:  No     Care Coordination Interventions:  Yes, provided   Follow up plan: Follow up call scheduled for 04/28/2022    Encounter Outcome:  Pt. Visit Completed   Tomasa Rand, RN, BSN, CEN [place] Coordinator [phone]

## 2022-04-26 ENCOUNTER — Ambulatory Visit: Payer: PPO | Admitting: Internal Medicine

## 2022-04-26 DIAGNOSIS — K769 Liver disease, unspecified: Secondary | ICD-10-CM | POA: Diagnosis not present

## 2022-04-26 DIAGNOSIS — N390 Urinary tract infection, site not specified: Secondary | ICD-10-CM | POA: Diagnosis not present

## 2022-04-26 NOTE — Assessment & Plan Note (Signed)
She has had mulitiple UTI's this past several months and we will refer her to urology.

## 2022-04-26 NOTE — Assessment & Plan Note (Signed)
I had a discussion with Infectious disease physician today.  He wants to obtain a MRI under liver protocol to look at this lesion.  I do not think she has an active infection as she has no other symptoms.

## 2022-04-26 NOTE — Progress Notes (Signed)
Office Visit  Subjective   Patient ID: Jeanette [name]   DOB: 06/08/1940   Age: 82 y.o.   MRN: [id number]   Chief Complaint Chief Complaint  Patient presents with   Follow-up     History of Present Illness Jeanette [name] returns today for followup of possible liver abscess where over the interim, she did have a repeat CT scan on 04/16/2022.  This showed a likely interval increase in size of a poorly defined heterogenous hypodensity of 4.8cm and 4.2cm of the right posterior hepatic lesion.  I had contacted Dr. Caryn Section last month and we discussed this case.  He did try to contact the patient last week but she she was not in an area she could talk so he was going to call her back this week.  I placed Dr. Caryn Section on the phone with myself and the patient and her family member today.  Today, she ddnies any recent nausea, vomiting, fevers, chills, abdominal pain.  However, she has been to the ER 4 times for UTI's since 02/05/22.  Again, she was discharged from [facility] on 02/02/2022 where she was sent from [place] with a fall. She had no injury but on xrays she had hypoxia and pulmonary edema with CHF exacerbation. She was given lasix and continued on antibiotics for a history of an intrabdominal infection in 12/2021 where she had sepsis with ascending cholangitis. They felt she had a minor concussion at that time. She was discharged from [facility] on 03/12/2022 where she was previously at home where she presented with confusion. She was diagnosed with a UTI and COVID-19 at that time and a repeat CT scan of her abd/pelvis which was significant for liver lesions. She was supposed to see infectious disease as an outpatient but they would not see her until they got the results of her repeat CT of abd/pelvis. She was sent home and unfortunately she got dizzy and unsteady on her feet and fell. She was admitted to the hospital from 12/5 until 12/9 where she discovered to have orthostasis and UTI. They felt she had a partially  treated UTI and gave her antibiotics and IVF's for dehydration. They felt she was still weak and they wanted her to be admitted back to short term rehab on 03/23/2022 but they were not able to get her placed. The patient was sent home on bactrim which they want her to take for 7 more days. Again, she her cholangitis with areas of hepatic infection where she is s/p ERCP with cultures growing out ESBL E. Coli where she had a PICC line placed and she was placed on antibiotics with Invanz. She had her PICC line removed on 02/26/2022 and her family is concerned that she never had a repeat CT scan of her abd/pelvis as asked by [facility] ID where they wanted to see if they need a longer course of antibiotics. The family states that this scan was not done and she did not see ID and they just pulled her PICC Line out at [facility] on 02/26/2022 and sent her home. I reviewed EPIC notes and she was seen by Dr. Caryn Section on 02/13/2022 in ID. Her CT abdomen pelvis with contrast at [facility] in 12/2021 revealed ill-defined lesion within liver 5.0 x 4.2 x 5.8 cm question metastasis versus primary tumor. Mild extrahepatic biliary dilatation probable 4 mm diameter calculus in the distal CBD. The patient was transferred to [facility] on 01/13/2022 for concern of possible ascending cholangitis and evaluation by GI  for possible ERCP. A MRI of the abdomen on 9/28 had a dominant masslike structure in the posterior right hepatic lobe with 3 additional similar-appearing lesions also identified in the right hepatic lobe, similar in appearance to the prior outside CT scan. She had sepsis with bacteremia with cholangitis and choledocholithiasis with liver abscess.      Past Medical History Past Medical History:  Diagnosis Date   Abscess of lower back 09/13/2016   Allergic rhinitis    Anemia 11/11/2017   Last Assessment & Plan:  Formatting of this note is different from the original. Lab Results  Component Value Date   WBC 5.8 11/16/2017   HGB 8.9 (L)  11/16/2017   HCT 27.9 (L) 11/16/2017   MCV 78.1 (L) 11/16/2017   PLT 264 11/16/2017   -Transfuse hemoglobin less than 7 - Continue iron supplement   Aortic atherosclerosis (HCC)    Arthralgia of hip 03/01/2017   Asthma    Colon polyp    Depressive disorder    Diabetic neuropathy (HCC)    DM2 (diabetes mellitus, type 2) ([place]) 09/13/2016   Encephalopathy 09/13/2016   Fat necrosis of skin 09/13/2016   High cholesterol    History of lumbar fusion 12/06/2017   History of surgical site infection 08/10/2017   Formatting of this note might be different from the original. Added automatically from request for surgery [id number]   History of total left knee replacement 05/12/2018   HTN (hypertension) 09/13/2016   Hyperlipemia    Impaired ambulation 11/14/2017   Last Assessment & Plan:  Formatting of this note might be different from the original. PT eval PT D/C Recs: 24/7 s/PRN a and HHPT. If appropriate assistance not available at home, rec inpt post acute therapy/low intensity prior to D/C home.   Infected orthopedic implant, subsequent encounter 05/12/2018   Lumbar degenerative disc disease 03/01/2017   Lumbar radiculopathy 03/01/2017   Obesity    Osteoarthritis    Respiratory failure, acute ([place]) 08/29/2016   Vertebral osteomyelitis ([place]) 11/10/2017   Last Assessment & Plan:  Formatting of this note might be different from the original. Osteomyelitis at L5 and T12 seen on MRI on 7/25  PLAN  -  OSH cultures growing Staph Ludgunensis, oxacillin susceptible. ABX adjusted ID consulted, appreciate recs  -  Oxacillin and Rifampin until 9/15  Ortho consulted - concerned for acute OM - no surgical intervention - IV abx x 6 weeks   IR consulted  - CT gu     Allergies Allergies  Allergen Reactions   Adenosine    Lidocaine Other (See Comments)    Reaction not recalled   Metoprolol Other (See Comments)    Reaction not recalled   Oxycodone Other (See Comments)    Overly-sedates the patient   Tramadol Itching    Cefepime Nausea And Vomiting    Tolerates oxacillin  Brand name for Maxipime        Rifampin     vomiting       Review of Systems Review of Systems  Constitutional:  Negative for chills, fever and malaise/fatigue.  Eyes:  Negative for blurred vision and double vision.  Respiratory:  Negative for cough and shortness of breath.   Cardiovascular:  Negative for chest pain, palpitations and leg swelling.  Gastrointestinal:  Negative for abdominal pain, blood in stool, constipation, diarrhea, melena, nausea and vomiting.  Musculoskeletal:  Negative for myalgias.  Skin:  Positive for itching. Negative for rash.  Neurological:  Negative for dizziness, weakness and headaches.  Objective:    Vitals There were no vitals taken for this visit.   Physical Examination Physical Exam Constitutional:      Appearance: Normal appearance. She is not ill-appearing.  Cardiovascular:     Rate and Rhythm: Normal rate and regular rhythm.     Pulses: Normal pulses.     Heart sounds: No murmur heard.    No friction rub. No gallop.  Pulmonary:     Effort: Pulmonary effort is normal. No respiratory distress.     Breath sounds: No wheezing, rhonchi or rales.  Abdominal:     General: Abdomen is flat. Bowel sounds are normal. There is no distension.     Palpations: Abdomen is soft.     Tenderness: There is no abdominal tenderness.  Skin:    Findings: No lesion or rash.  Neurological:     General: No focal deficit present.     Mental Status: She is alert and oriented to person, place, and time.        Assessment & Plan:   Liver lesion I had a discussion with Infectious disease physician today.  He wants to obtain a MRI under liver protocol to look at this lesion.  I do not think she has an active infection as she has no other symptoms.    Recurrent UTI She has had mulitiple UTI's this past several months and we will refer her to urology.    Return in about 3 months (around  07/26/2022) for annual.   Townsend Roger, MD

## 2022-04-27 ENCOUNTER — Other Ambulatory Visit: Payer: Self-pay

## 2022-04-27 MED ORDER — ACCU-CHEK SOFTCLIX LANCETS MISC: 12 refills | Status: DC

## 2022-04-28 ENCOUNTER — Ambulatory Visit: Payer: Self-pay

## 2022-04-28 NOTE — Patient Outreach (Signed)
  Care Coordination   Follow Up Visit Note   04/28/2022 Name: Jeanette [name] MRN: [id number] DOB: 09-Jul-1940  Jeanette [name] is a 82 y.o. year old female who sees Townsend Roger, MD for primary care. I spoke with  Markham Jordan by phone today Spoke with daughter [name].   What matters to the patients health and wellness today?  Spoke with daughter today to review progress with patient. Daughter reports that patient is about the same. Continues to be weak and have little energy.  Daughter reports patient can make it about 2 hours then needs to rest.  Daughter states she had follow up with PCP and consult with ID in MD office.  Daughter reports that patient was ordered a MRI of liver which is pending scheduling. Patient was also referred to urology and daughter requested urologist in [facility] that she is familiar with.  Urology appointment scheduled for 06/10/2022.      Goals Addressed               This Visit's Progress     Need help getting CT and labs scheduled (pt-stated)        Care Coordination Interventions: Reviewed medical record Encouraged daughter to continue to follow up as planned Provided a listening ear Reviewed pending MRI order in EPIC Reviewed with daughter S/S of UTI , which daughter is aware of.  Reviewed with daughter that patient is eating and drinking well. Daughter request call back in 1 week.  Appointment scheduled.                   SDOH assessments and interventions completed:  No     Care Coordination Interventions:  Yes, provided   Follow up plan: Follow up call scheduled for 05/05/2022    Encounter Outcome:  Pt. Visit Completed   Tomasa Rand, RN, BSN, CEN [place] Coordinator [phone]

## 2022-04-29 DIAGNOSIS — E119 Type 2 diabetes mellitus without complications: Secondary | ICD-10-CM | POA: Diagnosis not present

## 2022-04-29 DIAGNOSIS — Z9181 History of falling: Secondary | ICD-10-CM | POA: Diagnosis not present

## 2022-04-29 DIAGNOSIS — I4891 Unspecified atrial fibrillation: Secondary | ICD-10-CM | POA: Diagnosis not present

## 2022-04-29 DIAGNOSIS — J1282 Pneumonia due to coronavirus disease 2019: Secondary | ICD-10-CM | POA: Diagnosis not present

## 2022-04-29 DIAGNOSIS — F32A Depression, unspecified: Secondary | ICD-10-CM | POA: Diagnosis not present

## 2022-04-29 DIAGNOSIS — I110 Hypertensive heart disease with heart failure: Secondary | ICD-10-CM | POA: Diagnosis not present

## 2022-04-29 DIAGNOSIS — I5032 Chronic diastolic (congestive) heart failure: Secondary | ICD-10-CM | POA: Diagnosis not present

## 2022-04-29 DIAGNOSIS — M47813 Spondylosis without myelopathy or radiculopathy, cervicothoracic region: Secondary | ICD-10-CM | POA: Diagnosis not present

## 2022-04-29 DIAGNOSIS — N179 Acute kidney failure, unspecified: Secondary | ICD-10-CM | POA: Diagnosis not present

## 2022-04-29 DIAGNOSIS — I700 Atherosclerosis of aorta: Secondary | ICD-10-CM | POA: Diagnosis not present

## 2022-04-29 DIAGNOSIS — B9620 Unspecified Escherichia coli [E. coli] as the cause of diseases classified elsewhere: Secondary | ICD-10-CM | POA: Diagnosis not present

## 2022-04-29 DIAGNOSIS — B961 Klebsiella pneumoniae [K. pneumoniae] as the cause of diseases classified elsewhere: Secondary | ICD-10-CM | POA: Diagnosis not present

## 2022-04-29 DIAGNOSIS — N281 Cyst of kidney, acquired: Secondary | ICD-10-CM | POA: Diagnosis not present

## 2022-04-29 DIAGNOSIS — E860 Dehydration: Secondary | ICD-10-CM | POA: Diagnosis not present

## 2022-04-29 DIAGNOSIS — I2510 Atherosclerotic heart disease of native coronary artery without angina pectoris: Secondary | ICD-10-CM | POA: Diagnosis not present

## 2022-04-29 DIAGNOSIS — K8309 Other cholangitis: Secondary | ICD-10-CM | POA: Diagnosis not present

## 2022-04-29 DIAGNOSIS — D649 Anemia, unspecified: Secondary | ICD-10-CM | POA: Diagnosis not present

## 2022-04-29 DIAGNOSIS — E7800 Pure hypercholesterolemia, unspecified: Secondary | ICD-10-CM | POA: Diagnosis not present

## 2022-04-29 DIAGNOSIS — M1990 Unspecified osteoarthritis, unspecified site: Secondary | ICD-10-CM | POA: Diagnosis not present

## 2022-04-29 DIAGNOSIS — R7881 Bacteremia: Secondary | ICD-10-CM | POA: Diagnosis not present

## 2022-04-29 DIAGNOSIS — N390 Urinary tract infection, site not specified: Secondary | ICD-10-CM | POA: Diagnosis not present

## 2022-04-29 DIAGNOSIS — U071 COVID-19: Secondary | ICD-10-CM | POA: Diagnosis not present

## 2022-05-05 ENCOUNTER — Ambulatory Visit: Payer: Self-pay

## 2022-05-05 NOTE — Patient Outreach (Signed)
  Care Coordination   Follow Up Visit Note   05/05/2022 Name: JEFFIE WIDDOWSON MRN: [id number] DOB: 10-29-1940  SHAINNA FAUX is a 82 y.o. year old female who sees Townsend Roger, MD for primary care. I spoke with  Markham Jordan by phone today. Spoke with daughter [name]  What matters to the patients health and wellness today?  Follow up call to daughter. Daughter reports that patient is doing well at this time. Continues to have weakness and decreased energy.  Daughter contributes this to so many admissions.  Daughter reports that patient is scheduled for MRI on 05/10/2022  at [facility]. Reports urology appointment on 06/10/2022.   Daughter denies any signs of UTI at this time.    Goals Addressed               This Visit's Progress     COMPLETED: Need help getting CT and labs scheduled (pt-stated)        Care Coordination Interventions: Confirmed MRI is scheduled. No other needs at this time Reviewed with daughter to call me any time if she needs my assistance. Daughter very thankful for assistance and support that was provided.                   SDOH assessments and interventions completed:  No     Care Coordination Interventions:  Yes, provided   Follow up plan: No further intervention required.   Encounter Outcome:  Pt. Visit Completed   Tomasa Rand, RN, BSN, CEN [place] Coordinator [phone]

## 2022-05-12 ENCOUNTER — Ambulatory Visit: Payer: PPO | Admitting: Internal Medicine

## 2022-05-12 ENCOUNTER — Encounter: Payer: Self-pay | Admitting: Internal Medicine

## 2022-05-12 VITALS — BP 126/74 | HR 104 | Temp 98.2°F | Resp 20 | Ht 65.0 in | Wt 189.8 lb

## 2022-05-12 DIAGNOSIS — K7689 Other specified diseases of liver: Secondary | ICD-10-CM | POA: Diagnosis not present

## 2022-05-12 DIAGNOSIS — N390 Urinary tract infection, site not specified: Secondary | ICD-10-CM

## 2022-05-12 DIAGNOSIS — K8689 Other specified diseases of pancreas: Secondary | ICD-10-CM | POA: Diagnosis not present

## 2022-05-12 DIAGNOSIS — D3502 Benign neoplasm of left adrenal gland: Secondary | ICD-10-CM | POA: Diagnosis not present

## 2022-05-12 DIAGNOSIS — K769 Liver disease, unspecified: Secondary | ICD-10-CM | POA: Diagnosis not present

## 2022-05-12 DIAGNOSIS — K839 Disease of biliary tract, unspecified: Secondary | ICD-10-CM | POA: Diagnosis not present

## 2022-05-12 LAB — POCT URINALYSIS DIPSTICK
Glucose, UA: POSITIVE — AB
Ketones, UA: NEGATIVE
Nitrite, UA: POSITIVE
Protein, UA: POSITIVE — AB
Urobilinogen, UA: 1 E.U./dL
pH, UA: 6 (ref 5.0–8.0)

## 2022-05-12 MED ORDER — CIPROFLOXACIN HCL 500 MG PO TABS: 500.0000 mg | ORAL_TABLET | Freq: Two times a day (BID) | ORAL | 0 refills | Status: AC

## 2022-05-12 NOTE — Assessment & Plan Note (Signed)
She has a UTI by UA.  She did not give enough urine for a culture.  We will start her on cipro.

## 2022-05-12 NOTE — Progress Notes (Signed)
Office Visit  Subjective   Patient ID: Jeanette [name]   DOB: [birth year]   Age: 82 y.o.   MRN: [id number]   Chief Complaint Chief Complaint  Patient presents with   Altered Mental Status    Pts daughter states about 4 days of confusion and altered mental status;pt was given 2 days worth of leftover Cipro     History of Present Illness The patient is a 82 yo female who comes in today for an acute visit for confusion.  Her daughter states she thinks she has a UTI.  This started last night and her daughter accompanies her today.  The patient denies any dysuria, hematuria, frequency, f/c, n/v, abdominal pain.     Past Medical History Past Medical History:  Diagnosis Date   Abscess of lower back 09/13/2016   Allergic rhinitis    Anemia 11/11/2017   Last Assessment & Plan:  Formatting of this note is different from the original. Lab Results  Component Value Date   WBC 5.8 11/16/2017   HGB 8.9 (L) 11/16/2017   HCT 27.9 (L) 11/16/2017   MCV 78.1 (L) 11/16/2017   PLT 264 11/16/2017   -Transfuse hemoglobin less than 7 - Continue iron supplement   Aortic atherosclerosis (HCC)    Arthralgia of hip 03/01/2017   Asthma    Colon polyp    Depressive disorder    Diabetic neuropathy (HCC)    DM2 (diabetes mellitus, type 2) ([place]) 09/13/2016   Encephalopathy 09/13/2016   Fat necrosis of skin 09/13/2016   High cholesterol    History of lumbar fusion 12/06/2017   History of surgical site infection 08/10/2017   Formatting of this note might be different from the original. Added automatically from request for surgery [id number]   History of total left knee replacement 05/12/2018   HTN (hypertension) 09/13/2016   Hyperlipemia    Impaired ambulation 11/14/2017   Last Assessment & Plan:  Formatting of this note might be different from the original. PT eval PT D/C Recs: 24/7 s/PRN a and HHPT. If appropriate assistance not available at home, rec inpt post acute therapy/low intensity prior to D/C home.   Infected  orthopedic implant, subsequent encounter 05/12/2018   Lumbar degenerative disc disease 03/01/2017   Lumbar radiculopathy 03/01/2017   Obesity    Osteoarthritis    Respiratory failure, acute ([place]) 08/29/2016   Vertebral osteomyelitis ([place]) 11/10/2017   Last Assessment & Plan:  Formatting of this note might be different from the original. Osteomyelitis at L5 and T12 seen on MRI on 7/25  PLAN  -  OSH cultures growing Staph Ludgunensis, oxacillin susceptible. ABX adjusted ID consulted, appreciate recs  -  Oxacillin and Rifampin until 9/15  Ortho consulted - concerned for acute OM - no surgical intervention - IV abx x 6 weeks   IR consulted  - CT gu     Allergies Allergies  Allergen Reactions   Adenosine    Lidocaine Other (See Comments)    Reaction not recalled   Metoprolol Other (See Comments)    Reaction not recalled   Oxycodone Other (See Comments)    Overly-sedates the patient   Tramadol Itching   Cefepime Nausea And Vomiting    Tolerates oxacillin  Brand name for Maxipime        Rifampin     vomiting       Medications  Current Outpatient Medications:    Accu-Chek Softclix Lancets lancets, Use as instructed, Disp: 100 each, Rfl: 12  acetaminophen (TYLENOL) 500 MG tablet, Take 1,000 mg by mouth every 6 (six) hours as needed for mild pain or moderate pain., Disp: , Rfl:    amLODipine (NORVASC) 10 MG tablet, Take 1 tablet (10 mg total) by mouth daily., Disp: 90 tablet, Rfl: 1   apixaban (ELIQUIS) 5 MG TABS tablet, Take 1 tablet (5 mg total) by mouth 2 (two) times daily., Disp: 60 tablet, Rfl: 5   cephALEXin (KEFLEX) 500 MG capsule, Take 1 capsule (500 mg total) by mouth 2 (two) times daily., Disp: 60 capsule, Rfl: 5   cyanocobalamin (VITAMIN B12) 500 MCG tablet, Take 500 mcg by mouth daily., Disp: , Rfl:    gabapentin (NEURONTIN) 100 MG capsule, Take 1 capsule (100 mg total) by mouth 2 (two) times daily., Disp: 180 capsule, Rfl: 1   lisinopril (ZESTRIL) 40 MG tablet, Take 1  tablet (40 mg total) by mouth daily., Disp: 90 tablet, Rfl: 1   metFORMIN (GLUCOPHAGE) 500 MG tablet, Take 2 tablets (1,000 mg total) by mouth 2 (two) times daily., Disp: 180 tablet, Rfl: 1   metoprolol succinate (TOPROL-XL) 25 MG 24 hr tablet, Take 1 tablet (25 mg total) by mouth daily., Disp: 90 tablet, Rfl: 1   nystatin (MYCOSTATIN/NYSTOP) powder, Apply 1 application topically 2 (two) times daily., Disp: , Rfl:    potassium chloride SA (KLOR-CON M) 20 MEQ tablet, Take 1 tablet (20 mEq total) by mouth daily., Disp: 90 tablet, Rfl: 1   simvastatin (ZOCOR) 40 MG tablet, Take 1 tablet (40 mg total) by mouth daily., Disp: 30 tablet, Rfl: 5   Review of Systems Review of Systems  Constitutional:  Negative for chills and fever.  Respiratory:  Negative for cough and shortness of breath.   Gastrointestinal:  Negative for abdominal pain, nausea and vomiting.       Objective:    Vitals BP 126/74 (BP Location: Right Arm, Patient Position: Sitting, Cuff Size: Normal)   Pulse (!) 104   Temp 98.2 F (36.8 C) (Temporal)   Resp 20   Ht '5\' 5"'$  (1.651 m)   Wt 189 lb 12.8 oz (86.1 kg)   SpO2 90%   BMI 31.58 kg/m    Physical Examination Physical Exam Constitutional:      Appearance: Normal appearance. She is not ill-appearing.  Cardiovascular:     Rate and Rhythm: Normal rate and regular rhythm.     Pulses: Normal pulses.     Heart sounds: No murmur heard.    No friction rub. No gallop.  Pulmonary:     Effort: Pulmonary effort is normal. No respiratory distress.     Breath sounds: No wheezing, rhonchi or rales.  Abdominal:     General: Abdomen is flat. Bowel sounds are normal. There is no distension.     Palpations: Abdomen is soft.     Tenderness: There is no abdominal tenderness.  Musculoskeletal:     Right lower leg: No edema.     Left lower leg: No edema.  Neurological:     Mental Status: She is alert.        Assessment & Plan:   Urinary tract infection without hematuria She  has a UTI by UA.  She did not give enough urine for a culture.  We will start her on cipro.    No follow-ups on file.   Townsend Roger, MD

## 2022-05-17 ENCOUNTER — Telehealth: Payer: PPO | Admitting: Internal Medicine

## 2022-05-17 DIAGNOSIS — R160 Hepatomegaly, not elsewhere classified: Secondary | ICD-10-CM

## 2022-05-17 NOTE — Progress Notes (Signed)
 Office Visit  Subjective   Patient ID: Jeanette [name]   DOB: [birth year]   Age: 82 y.o.   MRN: [id number]   Chief Complaint No chief complaint on file.    History of Present Illness Jeanette [name] is a 82 yo female whom we are doing a telehealth with today to go over her recent MRI report.  She returns today for followup of possible liver abscess where over the interim, she did have a repeat CT scan on 04/16/2022.  This showed a likely interval increase in size of a poorly defined heterogenous hypodensity of 4.8cm and 4.2cm of the right posterior hepatic lesion.  I had contacted Dr. Catha Clink last month and we discussed this case.  He did try to contact the patient last week but she she was not in an area she could talk so he was going to call her back this week.  I placed Dr. Catha Clink on the phone with myself and the patient and her family member today.  Today, she ddnies any recent nausea, vomiting, fevers, chills, abdominal pain.  However, she has been to the ER 4 times for UTI's since 02/05/22.  Again, she was discharged from [facility] on 02/02/2022 where she was sent from [place] [facility] with a fall. She had no injury but on xrays she had hypoxia and pulmonary edema with CHF exacerbation. She was given lasix and continued on antibiotics for a history of an intrabdominal infection in 12/2021 where she had sepsis with ascending cholangitis. They felt she had a minor concussion at that time. She was discharged from [facility] on 03/12/2022 where she was previously at home where she presented with confusion. She was diagnosed with a UTI and COVID-19 at that time and a repeat CT scan of her abd/pelvis which was significant for liver lesions. She was supposed to see infectious disease as an outpatient but they would not see her until they got the results of her repeat CT of abd/pelvis. She was sent home and unfortunately she got dizzy and unsteady on her feet and fell. She was admitted to the hospital from 12/5 until 12/9 where  she discovered to have orthostasis and UTI. They felt she had a partially treated UTI and gave her antibiotics and IVF's for dehydration. They felt she was still weak and they wanted her to be admitted back to short term rehab on 03/23/2022 but they were not able to get her placed. The patient was sent home on bactrim which they want her to take for 7 more days. Again, she her cholangitis with areas of hepatic infection where she is s/p ERCP with cultures growing out ESBL E. Coli where she had a PICC line placed and she was placed on antibiotics with Invanz . She had her PICC line removed on 02/26/2022 and her family is concerned that she never had a repeat CT scan of her abd/pelvis as asked by [facility] ID where they wanted to see if they need a longer course of antibiotics. The family states that this scan was not done and she did not see ID and they just pulled her PICC Line out at [facility] on 02/26/2022 and sent her home. I reviewed EPIC notes and she was seen by Dr. Catha Clink on 02/13/2022 in ID. Her CT abdomen pelvis with contrast at [facility] in 12/2021 revealed ill-defined lesion within liver 5.0 x 4.2 x 5.8 cm question metastasis versus primary tumor. Mild extrahepatic biliary dilatation probable 4 mm diameter calculus in the distal CBD. The  patient was transferred to [facility] on 01/13/2022 for concern of possible ascending cholangitis and evaluation by GI for possible ERCP. A MRI of the abdomen on 9/28 had a dominant masslike structure in the posterior right hepatic lobe with 3 additional similar-appearing lesions also identified in the right hepatic lobe, similar in appearance to the prior outside CT scan. She had sepsis with bacteremia with cholangitis and choledocholithiasis with liver abscess.             Past Medical History Past Medical History:  Diagnosis Date   Abscess of lower back 09/13/2016   Allergic rhinitis    Anemia 11/11/2017   Last Assessment & Plan:  Formatting of this note is different from  the original. Lab Results  Component Value Date   WBC 5.8 11/16/2017   HGB 8.9 (L) 11/16/2017   HCT 27.9 (L) 11/16/2017   MCV 78.1 (L) 11/16/2017   PLT 264 11/16/2017   -Transfuse hemoglobin less than 7 - Continue iron supplement   Aortic atherosclerosis (HCC)    Arthralgia of hip 03/01/2017   Asthma    Colon polyp    Depressive disorder    Diabetic neuropathy (HCC)    DM2 (diabetes mellitus, type 2) (HCC) 09/13/2016   Encephalopathy 09/13/2016   Fat necrosis of skin 09/13/2016   High cholesterol    History of lumbar fusion 12/06/2017   History of surgical site infection 08/10/2017   Formatting of this note might be different from the original. Added automatically from request for surgery [id number]   History of total left knee replacement 05/12/2018   HTN (hypertension) 09/13/2016   Hyperlipemia    Impaired ambulation 11/14/2017   Last Assessment & Plan:  Formatting of this note might be different from the original. PT eval PT D/C Recs: 24/7 s/PRN a and HHPT. If appropriate assistance not available at home, rec inpt post acute therapy/low intensity prior to D/C home.   Infected orthopedic implant, subsequent encounter 05/12/2018   Lumbar degenerative disc disease 03/01/2017   Lumbar radiculopathy 03/01/2017   Obesity    Osteoarthritis    Respiratory failure, acute (HCC) 08/29/2016   Vertebral osteomyelitis (HCC) 11/10/2017   Last Assessment & Plan:  Formatting of this note might be different from the original. Osteomyelitis at L5 and T12 seen on MRI on 7/25  PLAN  -  OSH cultures growing Staph Ludgunensis, oxacillin susceptible. ABX adjusted ID consulted, appreciate recs  -  Oxacillin and Rifampin  until 9/15  Ortho consulted - concerned for acute OM - no surgical intervention - IV abx x 6 weeks   IR consulted  - CT gu     Allergies Allergies  Allergen Reactions   Lidocaine Other (See Comments) and Palpitations    Reaction not recalled   Tramadol Itching   Rifampin  Nausea And Vomiting     vomiting  Other Reaction(s): GI Intolerance  vomiting    Adenosine    Metoprolol  Other (See Comments)    Reaction not recalled   Oxycodone  Other (See Comments)    Overly-sedates the patient   Cefepime Nausea And Vomiting    Tolerates oxacillin  Brand name for Maxipime  Other Reaction(s): GI Intolerance     Medications  Current Outpatient Medications:    FLUoxetine  (PROZAC ) 20 MG capsule, Take 20 mg by mouth daily., Disp: , Rfl:    guaifenesin (ROBITUSSIN) 100 MG/5ML syrup, Take 300 mg by mouth every 4 (four) hours as needed for cough., Disp: , Rfl:    hydrocortisone (ANUSOL-HC) 25 MG  suppository, Place 25 mg rectally every 6 (six) hours as needed for hemorrhoids or anal itching., Disp: , Rfl:    magnesium  hydroxide (MILK OF MAGNESIA) 400 MG/5ML suspension, Take 30 mLs by mouth daily as needed for mild constipation or moderate constipation., Disp: , Rfl:    magnesium  oxide (MAG-OX) 400 (240 Mg) MG tablet, Take 400 mg by mouth daily., Disp: , Rfl:    metFORMIN  (GLUCOPHAGE ) 1000 MG tablet, Take 1,000 mg by mouth 2 (two) times daily with a meal., Disp: , Rfl:    Nutritional Supplements (NUTRITIONAL DRINK) LIQD, Take 1 Bottle by mouth 2 (two) times daily with a meal. Magic Cup; Give at lunch and supper, Disp: , Rfl:    NYAMYC  powder, Apply topically., Disp: , Rfl:    ondansetron  (ZOFRAN ) 4 MG tablet, Take 4 mg by mouth every 4 (four) hours as needed for nausea or vomiting., Disp: , Rfl:    pantoprazole  (PROTONIX ) 40 MG tablet, Take 40 mg by mouth daily., Disp: , Rfl:    Pollen Extracts (PROSTAT PO), Take 30 mLs by mouth daily., Disp: , Rfl:    Review of Systems Review of Systems  Constitutional:  Negative for chills and fever.  Respiratory:  Negative for shortness of breath.   Cardiovascular:  Negative for chest pain.  Gastrointestinal:  Negative for abdominal pain, constipation, diarrhea, nausea and vomiting.  Skin:  Negative for rash.  Neurological:  Negative for dizziness,  weakness and headaches.       Objective:    Vitals There were no vitals taken for this visit.   Physical Examination Physical Exam Constitutional:      Appearance: Normal appearance.  Neurological:     Mental Status: She is alert.  Psychiatric:        Mood and Affect: Mood normal.        Behavior: Behavior normal.        Assessment & Plan:   No problem-specific Assessment & Plan notes found for this encounter.    No follow-ups on file.   Wayne Haines, MD

## 2022-05-26 ENCOUNTER — Other Ambulatory Visit: Payer: Self-pay | Admitting: Internal Medicine

## 2022-06-08 ENCOUNTER — Encounter: Payer: Self-pay | Admitting: Internal Medicine

## 2022-06-08 ENCOUNTER — Ambulatory Visit: Payer: PPO | Admitting: Internal Medicine

## 2022-06-08 VITALS — BP 138/70 | HR 16 | Temp 97.6°F | Resp 16 | Ht 65.0 in | Wt 186.6 lb

## 2022-06-08 DIAGNOSIS — C229 Malignant neoplasm of liver, not specified as primary or secondary: Secondary | ICD-10-CM | POA: Diagnosis not present

## 2022-06-08 NOTE — Progress Notes (Signed)
Office Visit  Subjective   Patient ID: Jeanette [name]   DOB: [birth year]   Age: 82 y.o.   MRN: [id number]   Chief Complaint Chief Complaint  Patient presents with  . Follow-up     History of Present Illness Jeanette [name] is a 82 yo female who has had multiple hospital admissions over the last 3 months.  She was discharged from [facility] on 02/02/2022 where she was sent from [facility] with a fall.  She had no injury but on xrays she had hypoxia and pulmonary edema with CHF exacerbation.  She was given lasix and continued on antibiotics for a history of an intrabdominal infection in 12/2021 where she had sepsis with ascending cholangitis.  They felt she had a minor concussion at that time.  She was discharged from [facility] on 03/12/2022 where she was previously at home where she presented with confusion.  She was diagnosed with a UTI and COVID-19 at that time and a repeat CT scan of her abd/pelvis which was significant for liver lesions.  She was supposed to see infectious disease as an outpatient but they would not see her until they got the results of her repeat CT of abd/pelvis.  She was sent home and unfortunately she got dizzy and unsteady on her feet and fell.  She was admitted to the hospital from 12/5 until 12/9 where she discovered to have orthostasis and UTI.  They felt she had a partially treated UTI and gave her antibiotics and IVF's for dehydration.  They felt she was still weak and they wanted her to be admitted back to short term rehab on 03/23/2022 but they were not able to get her placed.  The patient was sent home on bactrim which they want her to take for 7 more days.  Again, she her cholangitis with areas of hepatic infection where she is s/p ERCP with cultures growing out ESBL E. Coli where she had a PICC line placed and she was placed on antibiotics with Invanz.  She had her PICC line removed on 02/26/2022 and her family is concerned that she never had a repeat CT scan of her abd/pelvis as  asked by [facility] ID where they wanted to see if they need a longer course of antibiotics.  The family states that this scan was not done and she did not see ID and they just pulled her PICC Line out at [facility] on 02/26/2022 and sent her home.  I reviewed EPIC notes and she was seen by Dr. Caryn Section on 02/13/2022 in ID. Her CT abdomen pelvis with contrast at [facility] in 12/2021 revealed ill-defined lesion within liver 5.0 x 4.2 x 5.8 cm question metastasis versus primary tumor.  Mild extrahepatic biliary dilatation probable 4 mm diameter calculus in the distal CBD.  The patient was transferred to [facility] on 01/13/2022  for concern of possible ascending cholangitis and evaluation by GI for possible ERCP.  A MRI of the abdomen on 9/28 had a dominant masslike structure in the posterior right hepatic lobe with 3 additional similar-appearing lesions also identified in the right hepatic lobe, similar in appearance to the prior outside CT scan.   She had sepsis with bacteremia with cholangitis and choledocholithiasis with liver abscess.  Today, her only complaints is intermittent nausea and poor food intake.           Past Medical History Past Medical History:  Diagnosis Date  . Abscess of lower back 09/13/2016  . Allergic rhinitis   .  Anemia 11/11/2017   Last Assessment & Plan:  Formatting of this note is different from the original. Lab Results  Component Value Date   WBC 5.8 11/16/2017   HGB 8.9 (L) 11/16/2017   HCT 27.9 (L) 11/16/2017   MCV 78.1 (L) 11/16/2017   PLT 264 11/16/2017   -Transfuse hemoglobin less than 7 - Continue iron supplement  . Aortic atherosclerosis ([place])   . Arthralgia of hip 03/01/2017  . Asthma   . Colon polyp   . Depressive disorder   . Diabetic neuropathy ([place])   . DM2 (diabetes mellitus, type 2) ([place]) 09/13/2016  . Encephalopathy 09/13/2016  . Fat necrosis of skin 09/13/2016  . High cholesterol   . History of lumbar fusion 12/06/2017  . History of surgical site infection 08/10/2017    Formatting of this note might be different from the original. Added automatically from request for surgery [phone]  . History of total left knee replacement 05/12/2018  . HTN (hypertension) 09/13/2016  . Hyperlipemia   . Impaired ambulation 11/14/2017   Last Assessment & Plan:  Formatting of this note might be different from the original. PT eval PT D/C Recs: 24/7 s/PRN a and HHPT. If appropriate assistance not available at home, rec inpt post acute therapy/low intensity prior to D/C home.  . Infected orthopedic implant, subsequent encounter 05/12/2018  . Lumbar degenerative disc disease 03/01/2017  . Lumbar radiculopathy 03/01/2017  . Obesity   . Osteoarthritis   . Respiratory failure, acute ([place]) 08/29/2016  . Vertebral osteomyelitis ([place]) 11/10/2017   Last Assessment & Plan:  Formatting of this note might be different from the original. Osteomyelitis at L5 and T12 seen on MRI on 7/25  PLAN  -  OSH cultures growing Staph Ludgunensis, oxacillin susceptible. ABX adjusted ID consulted, appreciate recs  -  Oxacillin and Rifampin until 9/15  Ortho consulted - concerned for acute OM - no surgical intervention - IV abx x 6 weeks   IR consulted  - CT gu     Allergies Allergies  Allergen Reactions  . Adenosine   . Lidocaine Other (See Comments)    Reaction not recalled  . Metoprolol Other (See Comments)    Reaction not recalled  . Oxycodone Other (See Comments)    Overly-sedates the patient  . Tramadol Itching  . Cefepime Nausea And Vomiting    Tolerates oxacillin  Brand name for Maxipime       . Rifampin     vomiting       Medications  Current Outpatient Medications:  .  Accu-Chek Softclix Lancets lancets, Use as instructed, Disp: 100 each, Rfl: 12 .  acetaminophen (TYLENOL) 500 MG tablet, Take 1,000 mg by mouth every 6 (six) hours as needed for mild pain or moderate pain., Disp: , Rfl:  .  amLODipine (NORVASC) 10 MG tablet, Take 1 tablet (10 mg total) by mouth daily., Disp: 90  tablet, Rfl: 1 .  apixaban (ELIQUIS) 5 MG TABS tablet, Take 1 tablet (5 mg total) by mouth 2 (two) times daily., Disp: 60 tablet, Rfl: 5 .  cephALEXin (KEFLEX) 500 MG capsule, Take 1 capsule (500 mg total) by mouth 2 (two) times daily., Disp: 60 capsule, Rfl: 5 .  cyanocobalamin (VITAMIN B12) 500 MCG tablet, Take 500 mcg by mouth daily., Disp: , Rfl:  .  gabapentin (NEURONTIN) 100 MG capsule, Take 1 capsule (100 mg total) by mouth 2 (two) times daily., Disp: 180 capsule, Rfl: 1 .  lisinopril (ZESTRIL) 40 MG tablet, Take 1  tablet (40 mg total) by mouth daily., Disp: 90 tablet, Rfl: 1 .  metFORMIN (GLUCOPHAGE) 500 MG tablet, TAKE 2 TABLETS BY MOUTH TWICE DAILY, Disp: 180 tablet, Rfl: 1 .  metoprolol succinate (TOPROL-XL) 25 MG 24 hr tablet, Take 1 tablet (25 mg total) by mouth daily., Disp: 90 tablet, Rfl: 1 .  nystatin (MYCOSTATIN/NYSTOP) powder, Apply 1 application topically 2 (two) times daily., Disp: , Rfl:  .  potassium chloride SA (KLOR-CON M) 20 MEQ tablet, Take 1 tablet (20 mEq total) by mouth daily., Disp: 90 tablet, Rfl: 1 .  simvastatin (ZOCOR) 40 MG tablet, Take 1 tablet (40 mg total) by mouth daily., Disp: 30 tablet, Rfl: 5   Review of Systems ROS     Objective:    Vitals BP 138/70   Pulse (!) 16   Temp 97.6 F (36.4 C)   Resp 16   Ht 5' 5"$  (1.651 m)   Wt 186 lb 9.6 oz (84.6 kg)   SpO2 96%   BMI 31.05 kg/m    Physical Examination Physical Exam     Assessment & Plan:   No problem-specific Assessment & Plan notes found for this encounter.    No follow-ups on file.   Townsend Roger, MD

## 2022-06-09 DIAGNOSIS — R109 Unspecified abdominal pain: Secondary | ICD-10-CM | POA: Diagnosis not present

## 2022-06-09 DIAGNOSIS — R591 Generalized enlarged lymph nodes: Secondary | ICD-10-CM | POA: Diagnosis not present

## 2022-06-09 DIAGNOSIS — I447 Left bundle-branch block, unspecified: Secondary | ICD-10-CM | POA: Diagnosis not present

## 2022-06-09 DIAGNOSIS — R160 Hepatomegaly, not elsewhere classified: Secondary | ICD-10-CM | POA: Diagnosis not present

## 2022-06-09 DIAGNOSIS — I444 Left anterior fascicular block: Secondary | ICD-10-CM | POA: Diagnosis not present

## 2022-06-09 DIAGNOSIS — N390 Urinary tract infection, site not specified: Secondary | ICD-10-CM | POA: Diagnosis not present

## 2022-06-09 DIAGNOSIS — R100 Acute abdomen: Secondary | ICD-10-CM | POA: Diagnosis not present

## 2022-06-09 DIAGNOSIS — R112 Nausea with vomiting, unspecified: Secondary | ICD-10-CM | POA: Diagnosis not present

## 2022-06-09 DIAGNOSIS — R10817 Generalized abdominal tenderness: Secondary | ICD-10-CM | POA: Diagnosis not present

## 2022-06-09 DIAGNOSIS — Z8505 Personal history of malignant neoplasm of liver: Secondary | ICD-10-CM | POA: Diagnosis not present

## 2022-06-09 DIAGNOSIS — R9431 Abnormal electrocardiogram [ECG] [EKG]: Secondary | ICD-10-CM | POA: Diagnosis not present

## 2022-06-09 DIAGNOSIS — R1084 Generalized abdominal pain: Secondary | ICD-10-CM | POA: Diagnosis not present

## 2022-06-09 NOTE — Assessment & Plan Note (Signed)
I went over her MRI once again and had a prolonged discussion with the patient and her daughter in the office.  The patient has decided she does not want a biopsy and does not want to be treated for this probable cancer.  We went over what workup and possibilities of treatment but again I told her we would not know exactly what to do without having a tissue diagnosis.  We discussed palliative care and she would like to go this route.

## 2022-06-24 DIAGNOSIS — N390 Urinary tract infection, site not specified: Secondary | ICD-10-CM | POA: Diagnosis not present

## 2022-07-05 DIAGNOSIS — N390 Urinary tract infection, site not specified: Secondary | ICD-10-CM | POA: Diagnosis not present

## 2022-07-07 DIAGNOSIS — G4489 Other headache syndrome: Secondary | ICD-10-CM | POA: Diagnosis not present

## 2022-07-07 DIAGNOSIS — R001 Bradycardia, unspecified: Secondary | ICD-10-CM | POA: Diagnosis not present

## 2022-07-07 DIAGNOSIS — R110 Nausea: Secondary | ICD-10-CM | POA: Diagnosis not present

## 2022-07-07 DIAGNOSIS — R55 Syncope and collapse: Secondary | ICD-10-CM | POA: Diagnosis not present

## 2022-07-07 DIAGNOSIS — I4891 Unspecified atrial fibrillation: Secondary | ICD-10-CM | POA: Diagnosis not present

## 2022-07-08 ENCOUNTER — Other Ambulatory Visit: Payer: Self-pay

## 2022-07-08 DIAGNOSIS — I361 Nonrheumatic tricuspid (valve) insufficiency: Secondary | ICD-10-CM

## 2022-07-08 MED ORDER — SIMVASTATIN 40 MG PO TABS: 40.0000 mg | ORAL_TABLET | Freq: Every day | ORAL | 5 refills | Status: DC

## 2022-07-11 DIAGNOSIS — E7220 Disorder of urea cycle metabolism, unspecified: Secondary | ICD-10-CM | POA: Diagnosis not present

## 2022-07-11 DIAGNOSIS — M6281 Muscle weakness (generalized): Secondary | ICD-10-CM | POA: Diagnosis not present

## 2022-07-11 DIAGNOSIS — I119 Hypertensive heart disease without heart failure: Secondary | ICD-10-CM | POA: Diagnosis not present

## 2022-07-11 DIAGNOSIS — R2681 Unsteadiness on feet: Secondary | ICD-10-CM | POA: Diagnosis not present

## 2022-07-11 DIAGNOSIS — J45909 Unspecified asthma, uncomplicated: Secondary | ICD-10-CM | POA: Diagnosis not present

## 2022-07-11 DIAGNOSIS — F413 Other mixed anxiety disorders: Secondary | ICD-10-CM | POA: Diagnosis not present

## 2022-07-11 DIAGNOSIS — D649 Anemia, unspecified: Secondary | ICD-10-CM | POA: Diagnosis not present

## 2022-07-11 DIAGNOSIS — E119 Type 2 diabetes mellitus without complications: Secondary | ICD-10-CM | POA: Diagnosis not present

## 2022-07-11 DIAGNOSIS — I4891 Unspecified atrial fibrillation: Secondary | ICD-10-CM | POA: Diagnosis not present

## 2022-07-11 DIAGNOSIS — I509 Heart failure, unspecified: Secondary | ICD-10-CM | POA: Diagnosis not present

## 2022-07-11 DIAGNOSIS — F419 Anxiety disorder, unspecified: Secondary | ICD-10-CM | POA: Diagnosis not present

## 2022-07-11 DIAGNOSIS — R2689 Other abnormalities of gait and mobility: Secondary | ICD-10-CM | POA: Diagnosis not present

## 2022-07-11 DIAGNOSIS — C229 Malignant neoplasm of liver, not specified as primary or secondary: Secondary | ICD-10-CM | POA: Diagnosis not present

## 2022-07-11 DIAGNOSIS — F329 Major depressive disorder, single episode, unspecified: Secondary | ICD-10-CM | POA: Diagnosis not present

## 2022-07-11 DIAGNOSIS — F4320 Adjustment disorder, unspecified: Secondary | ICD-10-CM | POA: Diagnosis not present

## 2022-07-11 DIAGNOSIS — I10 Essential (primary) hypertension: Secondary | ICD-10-CM | POA: Diagnosis not present

## 2022-07-11 DIAGNOSIS — N390 Urinary tract infection, site not specified: Secondary | ICD-10-CM | POA: Diagnosis not present

## 2022-07-11 DIAGNOSIS — E7089 Other disorders of aromatic amino-acid metabolism: Secondary | ICD-10-CM | POA: Diagnosis not present

## 2022-07-11 DIAGNOSIS — R262 Difficulty in walking, not elsewhere classified: Secondary | ICD-10-CM | POA: Diagnosis not present

## 2022-07-11 DIAGNOSIS — R410 Disorientation, unspecified: Secondary | ICD-10-CM | POA: Diagnosis not present

## 2022-07-11 DIAGNOSIS — Z1612 Extended spectrum beta lactamase (ESBL) resistance: Secondary | ICD-10-CM | POA: Diagnosis not present

## 2022-07-11 DIAGNOSIS — M25562 Pain in left knee: Secondary | ICD-10-CM | POA: Diagnosis not present

## 2022-07-11 DIAGNOSIS — E1151 Type 2 diabetes mellitus with diabetic peripheral angiopathy without gangrene: Secondary | ICD-10-CM | POA: Diagnosis not present

## 2022-07-11 DIAGNOSIS — K219 Gastro-esophageal reflux disease without esophagitis: Secondary | ICD-10-CM | POA: Diagnosis not present

## 2022-07-11 DIAGNOSIS — K649 Unspecified hemorrhoids: Secondary | ICD-10-CM | POA: Diagnosis not present

## 2022-07-11 DIAGNOSIS — R41841 Cognitive communication deficit: Secondary | ICD-10-CM | POA: Diagnosis not present

## 2022-07-11 DIAGNOSIS — F32A Depression, unspecified: Secondary | ICD-10-CM | POA: Diagnosis not present

## 2022-07-12 DIAGNOSIS — R262 Difficulty in walking, not elsewhere classified: Secondary | ICD-10-CM | POA: Diagnosis not present

## 2022-07-12 DIAGNOSIS — I119 Hypertensive heart disease without heart failure: Secondary | ICD-10-CM | POA: Diagnosis not present

## 2022-07-12 DIAGNOSIS — N390 Urinary tract infection, site not specified: Secondary | ICD-10-CM | POA: Diagnosis not present

## 2022-07-12 DIAGNOSIS — E1151 Type 2 diabetes mellitus with diabetic peripheral angiopathy without gangrene: Secondary | ICD-10-CM | POA: Diagnosis not present

## 2022-08-02 ENCOUNTER — Encounter: Payer: PPO | Admitting: Internal Medicine

## 2022-08-16 ENCOUNTER — Ambulatory Visit: Payer: PPO | Admitting: Internal Medicine

## 2022-08-20 DIAGNOSIS — C229 Malignant neoplasm of liver, not specified as primary or secondary: Secondary | ICD-10-CM | POA: Diagnosis not present

## 2022-08-21 DIAGNOSIS — R5381 Other malaise: Secondary | ICD-10-CM | POA: Diagnosis not present

## 2022-08-21 DIAGNOSIS — I4891 Unspecified atrial fibrillation: Secondary | ICD-10-CM | POA: Diagnosis not present

## 2022-08-21 DIAGNOSIS — I119 Hypertensive heart disease without heart failure: Secondary | ICD-10-CM | POA: Diagnosis not present

## 2022-08-21 DIAGNOSIS — D649 Anemia, unspecified: Secondary | ICD-10-CM | POA: Diagnosis not present

## 2022-09-02 DIAGNOSIS — D649 Anemia, unspecified: Secondary | ICD-10-CM | POA: Diagnosis not present

## 2022-09-02 DIAGNOSIS — Z79899 Other long term (current) drug therapy: Secondary | ICD-10-CM | POA: Diagnosis not present

## 2022-09-02 DIAGNOSIS — I10 Essential (primary) hypertension: Secondary | ICD-10-CM | POA: Diagnosis not present

## 2022-09-16 DIAGNOSIS — E1151 Type 2 diabetes mellitus with diabetic peripheral angiopathy without gangrene: Secondary | ICD-10-CM | POA: Diagnosis not present

## 2022-09-16 DIAGNOSIS — C229 Malignant neoplasm of liver, not specified as primary or secondary: Secondary | ICD-10-CM | POA: Diagnosis not present

## 2022-09-16 DIAGNOSIS — R5381 Other malaise: Secondary | ICD-10-CM | POA: Diagnosis not present

## 2022-09-16 DIAGNOSIS — K219 Gastro-esophageal reflux disease without esophagitis: Secondary | ICD-10-CM | POA: Diagnosis not present

## 2022-10-15 DIAGNOSIS — D649 Anemia, unspecified: Secondary | ICD-10-CM | POA: Diagnosis not present

## 2022-10-15 DIAGNOSIS — E1142 Type 2 diabetes mellitus with diabetic polyneuropathy: Secondary | ICD-10-CM | POA: Diagnosis not present

## 2022-10-15 DIAGNOSIS — I119 Hypertensive heart disease without heart failure: Secondary | ICD-10-CM | POA: Diagnosis not present

## 2022-10-15 DIAGNOSIS — C229 Malignant neoplasm of liver, not specified as primary or secondary: Secondary | ICD-10-CM | POA: Diagnosis not present

## 2022-10-28 DIAGNOSIS — F413 Other mixed anxiety disorders: Secondary | ICD-10-CM | POA: Diagnosis not present

## 2022-10-28 DIAGNOSIS — F32A Depression, unspecified: Secondary | ICD-10-CM | POA: Diagnosis not present

## 2022-11-01 ENCOUNTER — Encounter: Payer: PPO | Admitting: Internal Medicine

## 2022-11-03 DIAGNOSIS — I10 Essential (primary) hypertension: Secondary | ICD-10-CM | POA: Diagnosis not present

## 2022-11-16 DIAGNOSIS — E1151 Type 2 diabetes mellitus with diabetic peripheral angiopathy without gangrene: Secondary | ICD-10-CM | POA: Diagnosis not present

## 2022-11-16 DIAGNOSIS — I119 Hypertensive heart disease without heart failure: Secondary | ICD-10-CM | POA: Diagnosis not present

## 2022-11-16 DIAGNOSIS — I4891 Unspecified atrial fibrillation: Secondary | ICD-10-CM | POA: Diagnosis not present

## 2022-11-16 DIAGNOSIS — F339 Major depressive disorder, recurrent, unspecified: Secondary | ICD-10-CM | POA: Diagnosis not present

## 2022-11-22 DIAGNOSIS — I10 Essential (primary) hypertension: Secondary | ICD-10-CM | POA: Diagnosis not present

## 2022-11-23 DIAGNOSIS — R059 Cough, unspecified: Secondary | ICD-10-CM | POA: Diagnosis not present

## 2022-12-02 ENCOUNTER — Inpatient Hospital Stay (HOSPITAL_COMMUNITY): Payer: PPO

## 2022-12-02 ENCOUNTER — Inpatient Hospital Stay (HOSPITAL_COMMUNITY)
Admission: EM | Admit: 2022-12-02 | Discharge: 2022-12-07 | DRG: 156 | Disposition: A | Discharge status: Skilled Nursing Facility | Payer: PPO | Source: Skilled Nursing Facility | Attending: Internal Medicine | Admitting: Internal Medicine

## 2022-12-02 ENCOUNTER — Emergency Department (HOSPITAL_COMMUNITY): Payer: PPO

## 2022-12-02 ENCOUNTER — Other Ambulatory Visit: Payer: Self-pay

## 2022-12-02 ENCOUNTER — Encounter (HOSPITAL_COMMUNITY): Payer: Self-pay

## 2022-12-02 DIAGNOSIS — K1120 Sialoadenitis, unspecified: Secondary | ICD-10-CM

## 2022-12-02 DIAGNOSIS — R9431 Abnormal electrocardiogram [ECG] [EKG]: Secondary | ICD-10-CM | POA: Diagnosis not present

## 2022-12-02 DIAGNOSIS — Z885 Allergy status to narcotic agent status: Secondary | ICD-10-CM

## 2022-12-02 DIAGNOSIS — E119 Type 2 diabetes mellitus without complications: Secondary | ICD-10-CM | POA: Diagnosis not present

## 2022-12-02 DIAGNOSIS — Z833 Family history of diabetes mellitus: Secondary | ICD-10-CM | POA: Diagnosis not present

## 2022-12-02 DIAGNOSIS — K1121 Acute sialoadenitis: Principal | ICD-10-CM | POA: Diagnosis present

## 2022-12-02 DIAGNOSIS — E041 Nontoxic single thyroid nodule: Secondary | ICD-10-CM

## 2022-12-02 DIAGNOSIS — I447 Left bundle-branch block, unspecified: Secondary | ICD-10-CM | POA: Diagnosis present

## 2022-12-02 DIAGNOSIS — I10 Essential (primary) hypertension: Secondary | ICD-10-CM | POA: Diagnosis present

## 2022-12-02 DIAGNOSIS — Z8249 Family history of ischemic heart disease and other diseases of the circulatory system: Secondary | ICD-10-CM | POA: Diagnosis not present

## 2022-12-02 DIAGNOSIS — Z8505 Personal history of malignant neoplasm of liver: Secondary | ICD-10-CM | POA: Diagnosis not present

## 2022-12-02 DIAGNOSIS — Z90710 Acquired absence of both cervix and uterus: Secondary | ICD-10-CM

## 2022-12-02 DIAGNOSIS — Z981 Arthrodesis status: Secondary | ICD-10-CM | POA: Diagnosis not present

## 2022-12-02 DIAGNOSIS — Z6831 Body mass index (BMI) 31.0-31.9, adult: Secondary | ICD-10-CM | POA: Diagnosis not present

## 2022-12-02 DIAGNOSIS — Z66 Do not resuscitate: Secondary | ICD-10-CM | POA: Diagnosis present

## 2022-12-02 DIAGNOSIS — F329 Major depressive disorder, single episode, unspecified: Secondary | ICD-10-CM | POA: Diagnosis not present

## 2022-12-02 DIAGNOSIS — E785 Hyperlipidemia, unspecified: Secondary | ICD-10-CM | POA: Diagnosis present

## 2022-12-02 DIAGNOSIS — Z7984 Long term (current) use of oral hypoglycemic drugs: Secondary | ICD-10-CM

## 2022-12-02 DIAGNOSIS — Z7901 Long term (current) use of anticoagulants: Secondary | ICD-10-CM

## 2022-12-02 DIAGNOSIS — C229 Malignant neoplasm of liver, not specified as primary or secondary: Secondary | ICD-10-CM | POA: Diagnosis not present

## 2022-12-02 DIAGNOSIS — R519 Headache, unspecified: Secondary | ICD-10-CM | POA: Diagnosis present

## 2022-12-02 DIAGNOSIS — G4733 Obstructive sleep apnea (adult) (pediatric): Secondary | ICD-10-CM | POA: Diagnosis present

## 2022-12-02 DIAGNOSIS — Z888 Allergy status to other drugs, medicaments and biological substances status: Secondary | ICD-10-CM

## 2022-12-02 DIAGNOSIS — E7800 Pure hypercholesterolemia, unspecified: Secondary | ICD-10-CM | POA: Diagnosis present

## 2022-12-02 DIAGNOSIS — R220 Localized swelling, mass and lump, head: Secondary | ICD-10-CM | POA: Diagnosis present

## 2022-12-02 DIAGNOSIS — Z8049 Family history of malignant neoplasm of other genital organs: Secondary | ICD-10-CM

## 2022-12-02 DIAGNOSIS — Z86010 Personal history of colonic polyps: Secondary | ICD-10-CM

## 2022-12-02 DIAGNOSIS — J9811 Atelectasis: Secondary | ICD-10-CM | POA: Diagnosis not present

## 2022-12-02 DIAGNOSIS — D630 Anemia in neoplastic disease: Secondary | ICD-10-CM | POA: Diagnosis present

## 2022-12-02 DIAGNOSIS — I480 Paroxysmal atrial fibrillation: Secondary | ICD-10-CM | POA: Diagnosis present

## 2022-12-02 DIAGNOSIS — J45909 Unspecified asthma, uncomplicated: Secondary | ICD-10-CM | POA: Diagnosis present

## 2022-12-02 DIAGNOSIS — K219 Gastro-esophageal reflux disease without esophagitis: Secondary | ICD-10-CM | POA: Diagnosis present

## 2022-12-02 DIAGNOSIS — E1140 Type 2 diabetes mellitus with diabetic neuropathy, unspecified: Secondary | ICD-10-CM | POA: Diagnosis present

## 2022-12-02 DIAGNOSIS — E669 Obesity, unspecified: Secondary | ICD-10-CM | POA: Diagnosis present

## 2022-12-02 DIAGNOSIS — Z79899 Other long term (current) drug therapy: Secondary | ICD-10-CM

## 2022-12-02 DIAGNOSIS — Z823 Family history of stroke: Secondary | ICD-10-CM

## 2022-12-02 DIAGNOSIS — D696 Thrombocytopenia, unspecified: Secondary | ICD-10-CM | POA: Diagnosis not present

## 2022-12-02 DIAGNOSIS — K118 Other diseases of salivary glands: Secondary | ICD-10-CM | POA: Diagnosis not present

## 2022-12-02 DIAGNOSIS — K111 Hypertrophy of salivary gland: Secondary | ICD-10-CM | POA: Diagnosis not present

## 2022-12-02 DIAGNOSIS — R41841 Cognitive communication deficit: Secondary | ICD-10-CM | POA: Diagnosis not present

## 2022-12-02 DIAGNOSIS — Z96652 Presence of left artificial knee joint: Secondary | ICD-10-CM | POA: Diagnosis present

## 2022-12-02 DIAGNOSIS — R531 Weakness: Secondary | ICD-10-CM | POA: Diagnosis not present

## 2022-12-02 DIAGNOSIS — R1310 Dysphagia, unspecified: Secondary | ICD-10-CM | POA: Diagnosis not present

## 2022-12-02 DIAGNOSIS — E042 Nontoxic multinodular goiter: Secondary | ICD-10-CM | POA: Diagnosis present

## 2022-12-02 DIAGNOSIS — I491 Atrial premature depolarization: Secondary | ICD-10-CM | POA: Diagnosis not present

## 2022-12-02 DIAGNOSIS — Z9049 Acquired absence of other specified parts of digestive tract: Secondary | ICD-10-CM | POA: Diagnosis not present

## 2022-12-02 DIAGNOSIS — I700 Atherosclerosis of aorta: Secondary | ICD-10-CM | POA: Diagnosis not present

## 2022-12-02 DIAGNOSIS — J384 Edema of larynx: Secondary | ICD-10-CM | POA: Diagnosis not present

## 2022-12-02 DIAGNOSIS — R000 Tachycardia, unspecified: Secondary | ICD-10-CM | POA: Diagnosis not present

## 2022-12-02 DIAGNOSIS — R0902 Hypoxemia: Secondary | ICD-10-CM | POA: Diagnosis not present

## 2022-12-02 DIAGNOSIS — Z87440 Personal history of urinary (tract) infections: Secondary | ICD-10-CM

## 2022-12-02 DIAGNOSIS — Z79890 Hormone replacement therapy (postmenopausal): Secondary | ICD-10-CM

## 2022-12-02 DIAGNOSIS — R54 Age-related physical debility: Secondary | ICD-10-CM | POA: Diagnosis present

## 2022-12-02 DIAGNOSIS — I771 Stricture of artery: Secondary | ICD-10-CM | POA: Diagnosis not present

## 2022-12-02 DIAGNOSIS — Z713 Dietary counseling and surveillance: Secondary | ICD-10-CM

## 2022-12-02 LAB — COMPREHENSIVE METABOLIC PANEL
ALT: 18 U/L (ref 0–44)
AST: 23 U/L (ref 15–41)
Albumin: 2.5 g/dL — ABNORMAL LOW (ref 3.5–5.0)
Alkaline Phosphatase: 66 U/L (ref 38–126)
Anion gap: 16 — ABNORMAL HIGH (ref 5–15)
BUN: 12 mg/dL (ref 8–23)
CO2: 24 mmol/L (ref 22–32)
Calcium: 8 mg/dL — ABNORMAL LOW (ref 8.9–10.3)
Chloride: 95 mmol/L — ABNORMAL LOW (ref 98–111)
Creatinine, Ser: 0.74 mg/dL (ref 0.44–1.00)
GFR, Estimated: >60 mL/min (ref >60)
Glucose, Bld: 126 mg/dL — ABNORMAL HIGH (ref 70–99)
Potassium: 3.8 mmol/L (ref 3.5–5.1)
Sodium: 135 mmol/L (ref 135–145)
Total Bilirubin: 1.7 mg/dL — ABNORMAL HIGH (ref 0.3–1.2)
Total Protein: 5.9 g/dL — ABNORMAL LOW (ref 6.5–8.1)

## 2022-12-02 LAB — CBC WITH DIFFERENTIAL/PLATELET
Abs Immature Granulocytes: 0.28 10*3/uL — ABNORMAL HIGH (ref 0.00–0.07)
Basophils Absolute: 0.1 10*3/uL (ref 0.0–0.1)
Basophils Relative: 0 %
Eosinophils Absolute: 0.2 10*3/uL (ref 0.0–0.5)
Eosinophils Relative: 1 %
HCT: 41.3 % (ref 36.0–46.0)
Hemoglobin: 12.6 g/dL (ref 12.0–15.0)
Immature Granulocytes: 1 %
Lymphocytes Relative: 8 %
Lymphs Abs: 1.9 10*3/uL (ref 0.7–4.0)
MCH: 25.2 pg — ABNORMAL LOW (ref 26.0–34.0)
MCHC: 30.5 g/dL (ref 30.0–36.0)
MCV: 82.6 fL (ref 80.0–100.0)
Monocytes Absolute: 1 10*3/uL (ref 0.1–1.0)
Monocytes Relative: 4 %
Neutro Abs: 19.7 10*3/uL — ABNORMAL HIGH (ref 1.7–7.7)
Neutrophils Relative %: 86 %
Platelets: 192 10*3/uL (ref 150–400)
RBC: 5 MIL/uL (ref 3.87–5.11)
RDW: 16.5 % — ABNORMAL HIGH (ref 11.5–15.5)
WBC: 23.1 10*3/uL — ABNORMAL HIGH (ref 4.0–10.5)
nRBC: 0 % (ref 0.0–0.2)

## 2022-12-02 LAB — I-STAT CHEM 8, ED
BUN: 15 mg/dL (ref 8–23)
Calcium, Ion: 0.92 mmol/L — ABNORMAL LOW (ref 1.15–1.40)
Chloride: 98 mmol/L (ref 98–111)
Creatinine, Ser: 0.6 mg/dL (ref 0.44–1.00)
Glucose, Bld: 128 mg/dL — ABNORMAL HIGH (ref 70–99)
HCT: 41 % (ref 36.0–46.0)
Hemoglobin: 13.9 g/dL (ref 12.0–15.0)
Potassium: 3.9 mmol/L (ref 3.5–5.1)
Sodium: 134 mmol/L — ABNORMAL LOW (ref 135–145)
TCO2: 28 mmol/L (ref 22–32)

## 2022-12-02 LAB — MAGNESIUM: Magnesium: 1.3 mg/dL — ABNORMAL LOW (ref 1.7–2.4)

## 2022-12-02 LAB — CBG MONITORING, ED: Glucose-Capillary: 123 mg/dL — ABNORMAL HIGH (ref 70–99)

## 2022-12-02 MED ORDER — IOHEXOL 350 MG/ML SOLN
75.0000 mL | Freq: Once | INTRAVENOUS | Status: AC | PRN
  Administered 2022-12-02: 75 mL via INTRAVENOUS

## 2022-12-02 MED ORDER — INSULIN ASPART 100 UNIT/ML IJ SOLN: 0.0000 [IU] | Freq: Every day | INTRAMUSCULAR | Status: DC

## 2022-12-02 MED ORDER — SODIUM CHLORIDE 0.9 % IV SOLN
3.0000 g | Freq: Four times a day (QID) | INTRAVENOUS | Status: DC
  Administered 2022-12-03 – 2022-12-06 (×14): 3 g via INTRAVENOUS
  Filled 2022-12-02 (×16): qty 8

## 2022-12-02 MED ORDER — MAGNESIUM SULFATE 2 GM/50ML IV SOLN
2.0000 g | Freq: Once | INTRAVENOUS | Status: AC
  Administered 2022-12-02: 2 g via INTRAVENOUS
  Filled 2022-12-02: qty 50

## 2022-12-02 MED ORDER — VANCOMYCIN HCL 1750 MG/350ML IV SOLN
1750.0000 mg | Freq: Once | INTRAVENOUS | Status: AC
  Administered 2022-12-02: 1750 mg via INTRAVENOUS
  Filled 2022-12-02: qty 350

## 2022-12-02 MED ORDER — INSULIN ASPART 100 UNIT/ML IJ SOLN
0.0000 [IU] | INTRAMUSCULAR | Status: DC
  Administered 2022-12-02: 1 [IU] via SUBCUTANEOUS
  Administered 2022-12-03: 3 [IU] via SUBCUTANEOUS
  Administered 2022-12-03: 1 [IU] via SUBCUTANEOUS
  Administered 2022-12-03 (×3): 2 [IU] via SUBCUTANEOUS
  Administered 2022-12-04: 3 [IU] via SUBCUTANEOUS
  Administered 2022-12-04: 2 [IU] via SUBCUTANEOUS
  Administered 2022-12-04: 3 [IU] via SUBCUTANEOUS

## 2022-12-02 MED ORDER — PREDNISONE 20 MG PO TABS: 40.0000 mg | ORAL_TABLET | Freq: Every day | ORAL | Status: DC

## 2022-12-02 MED ORDER — VANCOMYCIN HCL IN DEXTROSE 1-5 GM/200ML-% IV SOLN
1000.0000 mg | INTRAVENOUS | Status: DC
  Administered 2022-12-03 – 2022-12-06 (×4): 1000 mg via INTRAVENOUS
  Filled 2022-12-02 (×5): qty 200

## 2022-12-02 MED ORDER — ACETAMINOPHEN 650 MG RE SUPP: 650.0000 mg | Freq: Four times a day (QID) | RECTAL | Status: DC | PRN

## 2022-12-02 MED ORDER — DEXTROSE 50 % IV SOLN: 12.5000 g | INTRAVENOUS | Status: DC | PRN

## 2022-12-02 MED ORDER — INSULIN ASPART 100 UNIT/ML IJ SOLN: 0.0000 [IU] | Freq: Three times a day (TID) | INTRAMUSCULAR | Status: DC

## 2022-12-02 MED ORDER — POTASSIUM CHLORIDE 10 MEQ/100ML IV SOLN
10.0000 meq | INTRAVENOUS | Status: AC
  Administered 2022-12-02 – 2022-12-03 (×2): 10 meq via INTRAVENOUS
  Filled 2022-12-02 (×2): qty 100

## 2022-12-02 MED ORDER — PREDNISONE 20 MG PO TABS
40.0000 mg | ORAL_TABLET | Freq: Once | ORAL | Status: AC
  Administered 2022-12-02: 40 mg via ORAL
  Filled 2022-12-02: qty 2

## 2022-12-02 MED ORDER — ACETAMINOPHEN 325 MG PO TABS: 650.0000 mg | ORAL_TABLET | Freq: Four times a day (QID) | ORAL | Status: DC | PRN

## 2022-12-02 MED ORDER — SODIUM CHLORIDE 0.9 % IV SOLN
3.0000 g | Freq: Once | INTRAVENOUS | Status: AC
  Administered 2022-12-02: 3 g via INTRAVENOUS
  Filled 2022-12-02: qty 8

## 2022-12-02 NOTE — Consult Note (Addendum)
NAME:  Jeanette [name], MRN:  [id number], DOB:  [birth year], LOS: 0 ADMISSION DATE:  12/02/2022, CONSULTATION DATE:  8/15 REFERRING MD:  Dr. Anitra Lauth, CHIEF COMPLAINT:  facial swelling   History of Present Illness:  82 year old with PMH as below, which is significant for DM, HTN, HLD, atrial fib, recurrent UTI, and asthma. She presented to [facility] ED 8/15 with complaints of right sided facial swelling for 2 months. She is a resident of [facility]. Upon arrival to the ED she was noted to have right sided facial edema and erythema. C/o increasing pain. Did complain of some difficulty eating and swallowing, but no difficulty breathing. She was sent for head and neck CT to better characterize the inflammation. CT concerning for infection of the parotid gland, but also concerning for airway narrowing at the level of the supraglottic larynx as a result. PCCM asked to evaluate for airway concerns.   Pertinent  Medical History   has a past medical history of Abscess of lower back (09/13/2016), Allergic rhinitis, Anemia (11/11/2017), Aortic atherosclerosis (HCC), Arthralgia of hip (03/01/2017), Asthma, Colon polyp, Depressive disorder, Diabetic neuropathy (HCC), DM2 (diabetes mellitus, type 2) (HCC) (09/13/2016), Encephalopathy (09/13/2016), Fat necrosis of skin (09/13/2016), High cholesterol, History of lumbar fusion (12/06/2017), History of surgical site infection (08/10/2017), History of total left knee replacement (05/12/2018), HTN (hypertension) (09/13/2016), Hyperlipemia, Impaired ambulation (11/14/2017), Infected orthopedic implant, subsequent encounter (05/12/2018), Lumbar degenerative disc disease (03/01/2017), Lumbar radiculopathy (03/01/2017), Obesity, Osteoarthritis, Respiratory failure, acute (HCC) (08/29/2016), and Vertebral osteomyelitis (HCC) (11/10/2017).   Significant Hospital Events: Including procedures, antibiotic start and stop dates in addition to other pertinent events   8/15 admitted for  parotitis with airway narrowing  Interim History / Subjective:    Objective   Blood pressure (!) 154/89, pulse 91, temperature 98.2 F (36.8 C), temperature source Oral, resp. rate 16, height 5\' 5"  (1.651 m), weight 84.6 kg, SpO2 90%.       No intake or output data in the 24 hours ending 12/02/22 2027 Filed Weights   12/02/22 1243  Weight: 84.6 kg    Examination: General: no distress HENT: marked R parotid area swelling/tenderness noted, malampatti 3, no issues from above at this point should intubation be needed Lungs: clear, no stridor, comfortable on RA Cardiovascular: RRR, ext warm Abdomen: soft, +BS Extremities: no edema Neuro: moves to command Skin: age related changes  CT neck reviewed: widely patent airway with some mild encoachment from R abutting cord area  Resolved Hospital Problem list     Assessment & Plan:   Parotitis Airway narrowing, supraglottic Atrial fibrillation  DM Hypertension   - Admit to floor vs. Progressive for airway monitoring and IV steroids/abx - IV Unasyn - Steroids - Instructed patient to tell us if she develops any SOB - Suspect if we monitor SpO2 she will drop due to undiagnosed OSA - Updated daughter on phone - We will touch base w/ TRH in AM   Best Practice (right click and "Reselect all SmartList Selections" daily)  Per primary  Labs   CBC: Recent Labs  Lab 12/02/22 1303 12/02/22 1402  WBC 23.1*  --   NEUTROABS 19.7*  --   HGB 12.6 13.9  HCT 41.3 41.0  MCV 82.6  --   PLT 192  --     Basic Metabolic Panel: Recent Labs  Lab 12/02/22 1303 12/02/22 1402  NA 135 134*  K 3.8 3.9  CL 95* 98  CO2 24  --   GLUCOSE 126*  128*  BUN 12 15  CREATININE 0.74 0.60  CALCIUM 8.0*  --   MG 1.3*  --    GFR: Estimated Creatinine Clearance: 61.2 mL/min (by C-G formula based on SCr of 0.6 mg/dL). Recent Labs  Lab 12/02/22 1303  WBC 23.1*    Liver Function Tests: Recent Labs  Lab 12/02/22 1303  AST 23  ALT 18   ALKPHOS 66  BILITOT 1.7*  PROT 5.9*  ALBUMIN 2.5*   No results for input(s): "LIPASE", "AMYLASE" in the last 168 hours. No results for input(s): "AMMONIA" in the last 168 hours.  ABG    Component Value Date/Time   PHART 7.444 08/28/2016 2348   PCO2ART 43.8 08/28/2016 2348   PO2ART 111.0 (H) 08/28/2016 2348   HCO3 30.1 (H) 08/28/2016 2348   TCO2 28 12/02/2022 1402   O2SAT 99.0 08/28/2016 2348     Coagulation Profile: No results for input(s): "INR", "PROTIME" in the last 168 hours.  Cardiac Enzymes: No results for input(s): "CKTOTAL", "CKMB", "CKMBINDEX", "TROPONINI" in the last 168 hours.  HbA1C: No results found for: "HGBA1C"  CBG: No results for input(s): "GLUCAP" in the last 168 hours.  Review of Systems:    Positive Symptoms in bold:  Constitutional fevers, chills, weight loss, fatigue, anorexia, malaise  Eyes decreased vision, double vision, eye irritation  Ears, Nose, Mouth, Throat sore throat, trouble swallowing, sinus congestion  Cardiovascular chest pain, paroxysmal nocturnal dyspnea, lower ext edema, palpitations   Respiratory SOB, cough, DOE, hemoptysis, wheezing  Gastrointestinal nausea, vomiting, diarrhea  Genitourinary burning with urination, trouble urinating  Musculoskeletal joint aches, joint swelling, back pain  Integumentary  rashes, skin lesions  Neurological focal weakness, focal numbness, trouble speaking, headaches  Psychiatric depression, anxiety, confusion  Endocrine polyuria, polydipsia, cold intolerance, heat intolerance  Hematologic abnormal bruising, abnormal bleeding, unexplained nose bleeds  Allergic/Immunologic recurrent infections, hives, swollen lymph nodes     Past Medical History:  She,  has a past medical history of Abscess of lower back (09/13/2016), Allergic rhinitis, Anemia (11/11/2017), Aortic atherosclerosis (HCC), Arthralgia of hip (03/01/2017), Asthma, Colon polyp, Depressive disorder, Diabetic neuropathy (HCC), DM2  (diabetes mellitus, type 2) (HCC) (09/13/2016), Encephalopathy (09/13/2016), Fat necrosis of skin (09/13/2016), High cholesterol, History of lumbar fusion (12/06/2017), History of surgical site infection (08/10/2017), History of total left knee replacement (05/12/2018), HTN (hypertension) (09/13/2016), Hyperlipemia, Impaired ambulation (11/14/2017), Infected orthopedic implant, subsequent encounter (05/12/2018), Lumbar degenerative disc disease (03/01/2017), Lumbar radiculopathy (03/01/2017), Obesity, Osteoarthritis, Respiratory failure, acute (HCC) (08/29/2016), and Vertebral osteomyelitis (HCC) (11/10/2017).   Surgical History:   Past Surgical History:  Procedure Laterality Date   ABDOMINAL HYSTERECTOMY     APPENDECTOMY     BACK SURGERY     CARDIAC CATHETERIZATION     Normal in 2002 and 2009   CHOLECYSTECTOMY     KNEE SURGERY Bilateral    SHOULDER SURGERY     SPINE SURGERY       Social History:   reports that she has never smoked. She has never used smokeless tobacco. She reports that she does not drink alcohol and does not use drugs.   Family History:  Her family history includes Cancer in her brother; Cervical cancer in her sister; Diabetes in her mother and sister; Heart disease in her father; Hypertension in her father and mother; Stroke in her mother.   Allergies Allergies  Allergen Reactions   Adenosine    Lidocaine Other (See Comments)    Reaction not recalled   Metoprolol Other (See Comments)  Reaction not recalled   Oxycodone Other (See Comments)    Overly-sedates the patient   Tramadol Itching   Cefepime Nausea And Vomiting    Tolerates oxacillin  Brand name for Maxipime        Rifampin     vomiting       Home Medications  Prior to Admission medications   Medication Sig Start Date End Date Taking? Authorizing Provider  Accu-Chek Softclix Lancets lancets Use as instructed 04/27/22   Leonia Reader, Barbara Cower, MD  acetaminophen (TYLENOL) 500 MG tablet Take 1,000 mg by mouth  every 6 (six) hours as needed for mild pain or moderate pain.    [provider]  amLODipine (NORVASC) 10 MG tablet Take 1 tablet (10 mg total) by mouth daily. 03/29/22   Crist Fat, MD  apixaban (ELIQUIS) 5 MG TABS tablet Take 1 tablet (5 mg total) by mouth 2 (two) times daily. 03/29/22   Crist Fat, MD  cephALEXin (KEFLEX) 500 MG capsule Take 1 capsule (500 mg total) by mouth 2 (two) times daily. 03/29/22   Crist Fat, MD  cyanocobalamin (VITAMIN B12) 500 MCG tablet Take 500 mcg by mouth daily.    [provider]  gabapentin (NEURONTIN) 100 MG capsule Take 1 capsule (100 mg total) by mouth 2 (two) times daily. 03/29/22   Crist Fat, MD  lisinopril (ZESTRIL) 40 MG tablet Take 1 tablet (40 mg total) by mouth daily. 03/29/22   Crist Fat, MD  metFORMIN (GLUCOPHAGE) 500 MG tablet TAKE 2 TABLETS BY MOUTH TWICE DAILY 05/27/22   Leonia Reader, Barbara Cower, MD  metoprolol succinate (TOPROL-XL) 25 MG 24 hr tablet Take 1 tablet (25 mg total) by mouth daily. 03/29/22   Crist Fat, MD  nystatin (MYCOSTATIN/NYSTOP) powder Apply 1 application topically 2 (two) times daily. 08/07/18   [provider]  potassium chloride SA (KLOR-CON M) 20 MEQ tablet Take 1 tablet (20 mEq total) by mouth daily. 03/29/22   Crist Fat, MD  simvastatin (ZOCOR) 40 MG tablet Take 1 tablet (40 mg total) by mouth daily. 07/08/22   Crist Fat, MD     Critical care time: N/A

## 2022-12-02 NOTE — H&P (Signed)
History and Physical    Jeanette [name]:[id number] DOB: 1940-07-31 DOA: 12/02/2022  PCP: Crist Fat, MD  Patient coming from: SNF  Chief Complaint: Facial swelling  HPI: Jeanette [name] is a 82 y.o. female with medical history significant of malignant neoplasm of the liver, Afib on Eliquis, asthma, type 2 diabetes, hyperlipidemia, hypertension, history of lumbar fusion and vertebral osteomyelitis in 2019 presented to the ED via EMS from [facility] for evaluation of swelling and redness to the right side of her face which began 2 months ago. Afebrile.  Labs showing WBC 23.1, T. bili 1.7 and remainder of LFTs normal, magnesium 1.3, potassium 3.8.  Chest x-ray showing slight left lung base atelectasis.  CT soft tissue neck showing findings suggestive of severe parotiditis and no drainable fluid collection.  Also showing asymmetrically edematous/thickened appearance of the right supraglottic larynx which is moderately narrowed, possibly infectious and secondary to the right parotid process, malignancy not excluded.  CT also showing an approximately 1.5 cm left thyroid nodule for which ultrasound is recommended for further evaluation.  Critical care evaluated the patient and felt that she was stable for the floor versus progressive care for airway monitoring and IV steroids/antibiotics. EDP discussed the case with the ENT, will consult in the morning.  Patient was given vancomycin, Unasyn, prednisone 40 mg, and IV magnesium 2 g. TRH called to admit.  Patient reports right-sided facial swelling and pain which she thinks started "2 days ago."  Denies any difficulty swallowing or pain with swallowing.  Denies any difficulty breathing.  Denies chest pain.  She has no other complaints and is not able to give any additional history.  Review of Systems:  Review of Systems  All other systems reviewed and are negative.   Past Medical History:  Diagnosis Date   Abscess of lower back 09/13/2016    Allergic rhinitis    Anemia 11/11/2017   Last Assessment & Plan:  Formatting of this note is different from the original. Lab Results  Component Value Date   WBC 5.8 11/16/2017   HGB 8.9 (L) 11/16/2017   HCT 27.9 (L) 11/16/2017   MCV 78.1 (L) 11/16/2017   PLT 264 11/16/2017   -Transfuse hemoglobin less than 7 - Continue iron supplement   Aortic atherosclerosis (HCC)    Arthralgia of hip 03/01/2017   Asthma    Colon polyp    Depressive disorder    Diabetic neuropathy (HCC)    DM2 (diabetes mellitus, type 2) (HCC) 09/13/2016   Encephalopathy 09/13/2016   Fat necrosis of skin 09/13/2016   High cholesterol    History of lumbar fusion 12/06/2017   History of surgical site infection 08/10/2017   Formatting of this note might be different from the original. Added automatically from request for surgery [id number]   History of total left knee replacement 05/12/2018   HTN (hypertension) 09/13/2016   Hyperlipemia    Impaired ambulation 11/14/2017   Last Assessment & Plan:  Formatting of this note might be different from the original. PT eval PT D/C Recs: 24/7 s/PRN a and HHPT. If appropriate assistance not available at home, rec inpt post acute therapy/low intensity prior to D/C home.   Infected orthopedic implant, subsequent encounter 05/12/2018   Lumbar degenerative disc disease 03/01/2017   Lumbar radiculopathy 03/01/2017   Obesity    Osteoarthritis    Respiratory failure, acute (HCC) 08/29/2016   Vertebral osteomyelitis (HCC) 11/10/2017   Last Assessment & Plan:  Formatting of this note might be  different from the original. Osteomyelitis at L5 and T12 seen on MRI on 7/25  PLAN  -  OSH cultures growing Staph Ludgunensis, oxacillin susceptible. ABX adjusted ID consulted, appreciate recs  -  Oxacillin and Rifampin until 9/15  Ortho consulted - concerned for acute OM - no surgical intervention - IV abx x 6 weeks   IR consulted  - CT gu    Past Surgical History:  Procedure Laterality Date   ABDOMINAL  HYSTERECTOMY     APPENDECTOMY     BACK SURGERY     CARDIAC CATHETERIZATION     Normal in 2002 and 2009   CHOLECYSTECTOMY     KNEE SURGERY Bilateral    SHOULDER SURGERY     SPINE SURGERY       reports that she has never smoked. She has never used smokeless tobacco. She reports that she does not drink alcohol and does not use drugs.  Allergies  Allergen Reactions   Adenosine    Lidocaine Other (See Comments)    Reaction not recalled   Metoprolol Other (See Comments)    Reaction not recalled   Oxycodone Other (See Comments)    Overly-sedates the patient   Tramadol Itching   Cefepime Nausea And Vomiting    Tolerates oxacillin  Brand name for Maxipime        Rifampin     vomiting      Family History  Problem Relation Age of Onset   Hypertension Mother    Diabetes Mother    Stroke Mother    Hypertension Father    Heart disease Father    Diabetes Sister    Cervical cancer Sister    Cancer Brother     Prior to Admission medications   Medication Sig Start Date End Date Taking? Authorizing Provider  Accu-Chek Softclix Lancets lancets Use as instructed 04/27/22   Leonia Reader, Barbara Cower, MD  acetaminophen (TYLENOL) 500 MG tablet Take 1,000 mg by mouth every 6 (six) hours as needed for mild pain or moderate pain.    [provider]  amLODipine (NORVASC) 10 MG tablet Take 1 tablet (10 mg total) by mouth daily. 03/29/22   Crist Fat, MD  apixaban (ELIQUIS) 5 MG TABS tablet Take 1 tablet (5 mg total) by mouth 2 (two) times daily. 03/29/22   Crist Fat, MD  cephALEXin (KEFLEX) 500 MG capsule Take 1 capsule (500 mg total) by mouth 2 (two) times daily. 03/29/22   Crist Fat, MD  cyanocobalamin (VITAMIN B12) 500 MCG tablet Take 500 mcg by mouth daily.    [provider]  gabapentin (NEURONTIN) 100 MG capsule Take 1 capsule (100 mg total) by mouth 2 (two) times daily. 03/29/22   Crist Fat, MD  lisinopril (ZESTRIL) 40 MG tablet Take 1 tablet (40 mg total)  by mouth daily. 03/29/22   Crist Fat, MD  metFORMIN (GLUCOPHAGE) 500 MG tablet TAKE 2 TABLETS BY MOUTH TWICE DAILY 05/27/22   Leonia Reader, Barbara Cower, MD  metoprolol succinate (TOPROL-XL) 25 MG 24 hr tablet Take 1 tablet (25 mg total) by mouth daily. 03/29/22   Crist Fat, MD  nystatin (MYCOSTATIN/NYSTOP) powder Apply 1 application topically 2 (two) times daily. 08/07/18   [provider]  potassium chloride SA (KLOR-CON M) 20 MEQ tablet Take 1 tablet (20 mEq total) by mouth daily. 03/29/22   Crist Fat, MD  simvastatin (ZOCOR) 40 MG tablet Take 1 tablet (40 mg total) by mouth daily. 07/08/22  Crist Fat, MD    Physical Exam: Vitals:   12/02/22 1243 12/02/22 1249 12/02/22 1655 12/02/22 1905  BP:  (!) 151/58 (!) 150/58 (!) 154/89  Pulse:  87 90 91  Resp:  16 16 16   Temp:  98.2 F (36.8 C) 99 F (37.2 C) 98.2 F (36.8 C)  TempSrc:  Oral Oral Oral  SpO2:  96% (!) 89% 90%  Weight: 84.6 kg     Height: 5\' 5"  (1.651 m)       Physical Exam Vitals reviewed.  Constitutional:      General: She is not in acute distress. HENT:     Head: Normocephalic and atraumatic.     Comments: Right parotid area swelling, erythema, and tenderness to palpation. Eyes:     Extraocular Movements: Extraocular movements intact.  Cardiovascular:     Rate and Rhythm: Normal rate and regular rhythm.     Pulses: Normal pulses.  Pulmonary:     Effort: Pulmonary effort is normal. No respiratory distress.     Breath sounds: Normal breath sounds. No stridor. No wheezing or rales.     Comments: Speaking clearly in full sentences Abdominal:     General: Bowel sounds are normal. There is no distension.     Palpations: Abdomen is soft.     Tenderness: There is no abdominal tenderness.  Musculoskeletal:     Cervical back: Normal range of motion.     Right lower leg: No edema.     Left lower leg: No edema.  Skin:    General: Skin is warm and dry.  Neurological:     General: No focal deficit  present.     Mental Status: She is alert and oriented to person, place, and time.     Labs on Admission: I have personally reviewed following labs and imaging studies  CBC: Recent Labs  Lab 12/02/22 1303 12/02/22 1402  WBC 23.1*  --   NEUTROABS 19.7*  --   HGB 12.6 13.9  HCT 41.3 41.0  MCV 82.6  --   PLT 192  --    Basic Metabolic Panel: Recent Labs  Lab 12/02/22 1303 12/02/22 1402  NA 135 134*  K 3.8 3.9  CL 95* 98  CO2 24  --   GLUCOSE 126* 128*  BUN 12 15  CREATININE 0.74 0.60  CALCIUM 8.0*  --   MG 1.3*  --    GFR: Estimated Creatinine Clearance: 61.2 mL/min (by C-G formula based on SCr of 0.6 mg/dL). Liver Function Tests: Recent Labs  Lab 12/02/22 1303  AST 23  ALT 18  ALKPHOS 66  BILITOT 1.7*  PROT 5.9*  ALBUMIN 2.5*   No results for input(s): "LIPASE", "AMYLASE" in the last 168 hours. No results for input(s): "AMMONIA" in the last 168 hours. Coagulation Profile: No results for input(s): "INR", "PROTIME" in the last 168 hours. Cardiac Enzymes: No results for input(s): "CKTOTAL", "CKMB", "CKMBINDEX", "TROPONINI" in the last 168 hours. BNP (last 3 results) No results for input(s): "PROBNP" in the last 8760 hours. HbA1C: No results for input(s): "HGBA1C" in the last 72 hours. CBG: No results for input(s): "GLUCAP" in the last 168 hours. Lipid Profile: No results for input(s): "CHOL", "HDL", "LDLCALC", "TRIG", "CHOLHDL", "LDLDIRECT" in the last 72 hours. Thyroid Function Tests: No results for input(s): "TSH", "T4TOTAL", "FREET4", "T3FREE", "THYROIDAB" in the last 72 hours. Anemia Panel: No results for input(s): "VITAMINB12", "FOLATE", "FERRITIN", "TIBC", "IRON", "RETICCTPCT" in the last 72 hours. Urine analysis:  Component Value Date/Time   COLORURINE YELLOW 09/13/2016 2210   APPEARANCEUR CLEAR 09/13/2016 2210   LABSPEC 1.011 09/13/2016 2210   PHURINE 7.0 09/13/2016 2210   GLUCOSEU NEGATIVE 09/13/2016 2210   HGBUR NEGATIVE 09/13/2016 2210    BILIRUBINUR small 05/12/2022 1418   KETONESUR 5 (A) 09/13/2016 2210   PROTEINUR Positive (A) 05/12/2022 1418   PROTEINUR 30 (A) 09/13/2016 2210   UROBILINOGEN 1.0 05/12/2022 1418   NITRITE positive 05/12/2022 1418   NITRITE NEGATIVE 09/13/2016 2210   LEUKOCYTESUR Trace (A) 05/12/2022 1418    Radiological Exams on Admission: CT Soft Tissue Neck W Contrast  Result Date: 12/02/2022 CLINICAL DATA:  Soft tissue infection suspected, neck, no prior imaging facial swelling EXAM: CT NECK WITH CONTRAST TECHNIQUE: Multidetector CT imaging of the neck was performed using the standard protocol following the bolus administration of intravenous contrast. RADIATION DOSE REDUCTION: This exam was performed according to the departmental dose-optimization program which includes automated exposure control, adjustment of the mA and/or kV according to patient size and/or use of iterative reconstruction technique. CONTRAST:  75mL OMNIPAQUE IOHEXOL 350 MG/ML SOLN COMPARISON:  None Available. FINDINGS: Pharynx and larynx: Asymmetrically edematous/thickened appearance of the right supraglottic larynx (for example series 3, image 70) which is moderately narrowed. Salivary glands: Enlarged/edematous right parotid gland with surrounding edema. Nodularity of the right parotid gland. Left parotid gland and submandibular glands are unremarkable. Thyroid: Approximately 1.5 cm left thyroid nodule. Lymph nodes: Multiple areas of nodular in the right midgland, likely prominent lymph nodes. Mildly prominent right upper cervical chain nodes. Vascular: Aortic atherosclerosis. Limited intracranial: Negative. Visualized orbits: Negative. Mastoids and visualized paranasal sinuses: Clear. Skeleton: No acute abnormality.  Multilevel degenerative change. Upper chest: Visualized lung apices are clear. IMPRESSION: 1. Enlarged/edematous right parotid gland with surrounding edema, suggestive of severe parotiditis. Nodularity within the right parotid  likely represents inflamed lymph nodes. No drainable fluid collection. 2. Asymmetrically edematous/thickened appearance of the right supraglottic larynx which is moderately narrowed, probably infectious and secondary to the right parotid process. Correlation with direct visualization is recommended to exclude malignancy. 3. Approximately 1.5 cm left thyroid nodule. Recommend thyroid US (ref: J Am Coll Radiol. 2015 Feb;12(2): 143-50). Electronically Signed   By: Feliberto Harts M.D.   On: 12/02/2022 18:03   DG Chest 2 View  Result Date: 12/02/2022 CLINICAL DATA:  Hypoxia EXAM: CHEST - 2 VIEW COMPARISON:  None Available. FINDINGS: The heart size and mediastinal contours are within normal limits. Slight linear opacity left lung base likely scar or atelectasis. No consolidation, pneumothorax or effusion. No edema. Calcified and tortuous aorta. The visualized skeletal structures are unremarkable. Degenerative changes along the spine. Underinflation. IMPRESSION: Underinflation.  Slight left lung base atelectasis. Electronically Signed   By: Karen Kays M.D.   On: 12/02/2022 15:07    EKG: Independently reviewed.  Sinus tachycardia, LBBB is not new, QTc 500.  Assessment and Plan  Severe parotitis Supraglottic airway narrowing WBC 23.1, no fever or signs of sepsis.  Patient is satting well on room air.  No wheezing or stridor.  Critical care evaluated the patient and felt that she was stable for the floor versus progressive care for airway monitoring and steroids/antibiotics.  Admit to progressive care unit for close monitoring.  Continue IV antibiotics and steroids.  ENT will consult in the morning.  Patient reported difficulty swallowing to ED provider, keep n.p.o. at this time and order SLP eval.  Incidental thyroid nodule Seen on CT.  Ultrasound ordered for further evaluation.  Check TSH  and free T4.  Hypomagnesemia QT prolongation Keep K >4 and Mag >2.  Continue to monitor labs and repeat EKG in  the morning.  Avoid QT prolonging drugs.  Malignant neoplasm of the liver Per PCP note from 06/08/2022, patient had decided not to pursue further workup or treatment for this problem.  She was referred to palliative care.  Paroxysmal Afib Currently in sinus rhythm.  Continue metoprolol and Eliquis after pharmacy med rec is done.  Type 2 diabetes Check A1c.  Sensitive sliding scale insulin.  Asthma:  Stable, no signs of acute exacerbation. Hyperlipidemia Hypertension:  Systolic in the 150s. Pharmacy med rec pending.  DVT prophylaxis: Continue Eliquis after pharmacy med rec is done. Code Status: Full Code (discussed with the patient) Family Communication: No family available at this time. Consults called: PCCM, ENT  Level of care: Progressive Care Unit Admission status: It is my clinical opinion that admission to INPATIENT is reasonable and necessary because of the expectation that this patient will require hospital care that crosses at least 2 midnights to treat this condition based on the medical complexity of the problems presented.  Given the aforementioned information, the predictability of an adverse outcome is felt to be significant.   John Giovanni MD [facility]  If 7PM-7AM, please contact night-coverage [email or web address]  12/02/2022, 9:47 PM

## 2022-12-02 NOTE — ED Triage Notes (Signed)
Pt bib ems from [facility] c/o cyst on right side of face. Last night the cyst was the size of a golf ball and today it has grown to the size of a soft ball. Pt denies any recent dental surgeries, fevers, or illness. Pt states she has a hard time swallowing and haven't had anything PO since 12/01/22. Pt states she is dizzy and a headache.  EMS state pt is ambulatory and complete tasks on her own but it was hard to get pt up.   BP 164/90 HR 84 CBG 132 RA 89% placed on 3L nasal canula 96%

## 2022-12-02 NOTE — Progress Notes (Addendum)
Pharmacy Antibiotic Note  Jeanette [name] is a 82 y.o. female admitted on 12/02/2022 with  parotitis  .  Pharmacy has been consulted for unasyn and vancomycin dosing.  CT with evidence of parotitis.  WBC 23.1, afebrile. Pt was loaded with 1750 mg vancomycin IV and 3 g unasyn IV in the ED   Plan: Start Unasyn 3 grams IV every 6 hours Start Vancomycin 1000 mg IV every 24 hours (eAUC 503)  Monitor WBC, temperature, and clinical picture  Height: 5\' 5"  (165.1 cm) Weight: 84.6 kg (186 lb 8.2 oz) IBW/kg (Calculated) : 57  Temp (24hrs), Avg:98.5 F (36.9 C), Min:98.2 F (36.8 C), Max:99 F (37.2 C)  Recent Labs  Lab 12/02/22 1303 12/02/22 1402  WBC 23.1*  --   CREATININE 0.74 0.60    Estimated Creatinine Clearance: 61.2 mL/min (by C-G formula based on SCr of 0.6 mg/dL).    Allergies  Allergen Reactions   Adenosine    Lidocaine Other (See Comments)    Reaction not recalled   Metoprolol Other (See Comments)    Reaction not recalled   Oxycodone Other (See Comments)    Overly-sedates the patient   Tramadol Itching   Cefepime Nausea And Vomiting    Tolerates oxacillin  Brand name for Maxipime        Rifampin     vomiting      Antimicrobials this admission: 8/15 vancomycin >>  8/15 unasyn >>   Dose adjustments this admission:   Microbiology results:   Thank you for allowing pharmacy to be a part of this patient's care.  Griffin Dakin 12/02/2022 9:02 PM

## 2022-12-02 NOTE — ED Notes (Signed)
Daughter Sharren Bridge [phone] would like an update asap

## 2022-12-02 NOTE — ED Provider Notes (Signed)
[facility] EMERGENCY DEPARTMENT AT [facility] Provider Note   CSN: [id number] Arrival date & time: 12/02/22  1223     History  Chief Complaint  Patient presents with   Cyst    Jeanette [name] is a 82 y.o. female.  HPI   Patient is a 82 year old female with past medical history of type 2 diabetes, hypertension, HLD, presenting today for swelling and redness to the right side of her face.  She notes that it began around 2 months ago.  She admits to increasing pain.  She is unable to state what brought her in specifically regarding it today.  She states has been able to eat and speak without significant difficulty.  She denies any associated shortness of breath.  She does note worsening pain with eating.  She denies any drainage into her mouth or from her ear.  She denies any known fevers or chills.  She has had no nausea or vomiting.  She has not been evaluated for this since it began.  Home Medications Prior to Admission medications   Medication Sig Start Date End Date Taking? Authorizing Provider  Accu-Chek Softclix Lancets lancets Use as instructed 04/27/22   Leonia Reader, Barbara Cower, MD  acetaminophen (TYLENOL) 500 MG tablet Take 1,000 mg by mouth every 6 (six) hours as needed for mild pain or moderate pain.    [provider]  amLODipine (NORVASC) 10 MG tablet Take 1 tablet (10 mg total) by mouth daily. 03/29/22   Crist Fat, MD  apixaban (ELIQUIS) 5 MG TABS tablet Take 1 tablet (5 mg total) by mouth 2 (two) times daily. 03/29/22   Crist Fat, MD  cephALEXin (KEFLEX) 500 MG capsule Take 1 capsule (500 mg total) by mouth 2 (two) times daily. 03/29/22   Crist Fat, MD  cyanocobalamin (VITAMIN B12) 500 MCG tablet Take 500 mcg by mouth daily.    [provider]  gabapentin (NEURONTIN) 100 MG capsule Take 1 capsule (100 mg total) by mouth 2 (two) times daily. 03/29/22   Crist Fat, MD  lisinopril (ZESTRIL) 40 MG tablet Take 1 tablet (40 mg total) by  mouth daily. 03/29/22   Crist Fat, MD  metFORMIN (GLUCOPHAGE) 500 MG tablet TAKE 2 TABLETS BY MOUTH TWICE DAILY 05/27/22   Leonia Reader, Barbara Cower, MD  metoprolol succinate (TOPROL-XL) 25 MG 24 hr tablet Take 1 tablet (25 mg total) by mouth daily. 03/29/22   Crist Fat, MD  nystatin (MYCOSTATIN/NYSTOP) powder Apply 1 application topically 2 (two) times daily. 08/07/18   [provider]  potassium chloride SA (KLOR-CON M) 20 MEQ tablet Take 1 tablet (20 mEq total) by mouth daily. 03/29/22   Crist Fat, MD  simvastatin (ZOCOR) 40 MG tablet Take 1 tablet (40 mg total) by mouth daily. 07/08/22   Crist Fat, MD      Allergies    Adenosine, Lidocaine, Metoprolol, Oxycodone, Tramadol, Cefepime, and Rifampin    Review of Systems   Review of Systems Negative except for as noted above in HPI  Physical Exam Updated Vital Signs BP (!) 150/58 (BP Location: Left Arm)   Pulse 90   Temp 99 F (37.2 C)   Resp 16   Ht 5\' 5"  (1.651 m)   Wt 84.6 kg   SpO2 (!) 89%   BMI 31.04 kg/m  Physical Exam Vitals and nursing note reviewed.  Constitutional:      General: She is not in acute distress.  Appearance: She is well-developed.  HENT:     Head:     Comments: Right side of the face with erythema and swelling noted along the anterior ear and mandible from the level of the maxilla to just inferior to the mandible with associated tenderness and firmness to palpation    Mouth/Throat:     Mouth: Mucous membranes are moist.  Eyes:     Conjunctiva/sclera: Conjunctivae normal.     Pupils: Pupils are equal, round, and reactive to light.  Cardiovascular:     Rate and Rhythm: Normal rate and regular rhythm.     Heart sounds: No murmur heard. Pulmonary:     Effort: Pulmonary effort is normal. No respiratory distress.     Breath sounds: Normal breath sounds.     Comments: Saturating well on 3L O2 via [place] Abdominal:     Palpations: Abdomen is soft.     Tenderness: There is no abdominal  tenderness.  Musculoskeletal:        General: No swelling.     Cervical back: Neck supple.  Skin:    General: Skin is warm and dry.     Capillary Refill: Capillary refill takes less than 2 seconds.     Findings: Erythema (to the right side of the face) present.  Neurological:     Mental Status: She is alert and oriented to person, place, and time.  Psychiatric:        Mood and Affect: Mood normal.     ED Results / Procedures / Treatments   Labs (all labs ordered are listed, but only abnormal results are displayed) Labs Reviewed  CBC WITH DIFFERENTIAL/PLATELET - Abnormal; Notable for the following components:      Result Value   WBC 23.1 (*)    MCH 25.2 (*)    RDW 16.5 (*)    Neutro Abs 19.7 (*)    Abs Immature Granulocytes 0.28 (*)    All other components within normal limits  COMPREHENSIVE METABOLIC PANEL - Abnormal; Notable for the following components:   Chloride 95 (*)    Glucose, Bld 126 (*)    Calcium 8.0 (*)    Total Protein 5.9 (*)    Albumin 2.5 (*)    Total Bilirubin 1.7 (*)    Anion gap 16 (*)    All other components within normal limits  MAGNESIUM - Abnormal; Notable for the following components:   Magnesium 1.3 (*)    All other components within normal limits  I-STAT CHEM 8, ED - Abnormal; Notable for the following components:   Sodium 134 (*)    Glucose, Bld 128 (*)    Calcium, Ion 0.92 (*)    All other components within normal limits    EKG EKG Interpretation Date/Time:  Thursday December 02 2022 14:59:37 EDT Ventricular Rate:  101 PR Interval:  116 QRS Duration:  132 QT Interval:  386 QTC Calculation: 500 R Axis:   -53  Text Interpretation: Sinus tachycardia Left axis deviation Left bundle branch block No previous ECGs available Confirmed by Gwyneth Sprout ([id number]) on 12/02/2022 3:38:03 PM  Radiology DG Chest 2 View  Result Date: 12/02/2022 CLINICAL DATA:  Hypoxia EXAM: CHEST - 2 VIEW COMPARISON:  None Available. FINDINGS: The heart size and  mediastinal contours are within normal limits. Slight linear opacity left lung base likely scar or atelectasis. No consolidation, pneumothorax or effusion. No edema. Calcified and tortuous aorta. The visualized skeletal structures are unremarkable. Degenerative changes along the spine. Underinflation. IMPRESSION: Underinflation.  Slight left lung base atelectasis. Electronically Signed   By: Karen Kays M.D.   On: 12/02/2022 15:07      Medications Ordered in ED Medications  magnesium sulfate IVPB 2 g 50 mL (0 g Intravenous Stopped 12/02/22 1606)  iohexol (OMNIPAQUE) 350 MG/ML injection 75 mL (75 mLs Intravenous Contrast Given 12/02/22 1543)    ED Course/ Medical Decision Making/ A&P Clinical Course as of 12/02/22 1716  Thu Dec 02, 2022  1537 St. Cyst on R side of face. WBC 23. Getting CT. Probable admission for IV Abx. Also has new O2 requirement.   [WL]    Clinical Course User Index [WL] Dyanne Iha, MD                                Medical Decision Making Problems Addressed: Facial swelling: complicated acute illness or injury  Amount and/or Complexity of Data Reviewed Labs: ordered. Radiology: ordered and independent interpretation performed.  Risk Prescription drug management.    Patient is a 82 year old female with past medical history of type 2 diabetes, hypertension, HLD, presenting today for swelling and redness to the right side of her face.  On exam, patient has edema and erythema noted to the right side of her face from the level of the maxilla just even with the superior aspect of her ear extending down to her mandible and superior aspect of her neck.  She is associated tenderness to palpation with firmness.  No abnormalities are seen intraorally.  Presentation concerning for parotid abscess or inflammation.  Will also evaluate for new malignancy.  Lesser concern for mastoiditis as all of her redness is located anterior to the ear.  She states that the area has  been increasing in size over the last 2 months, decreasing concern for an acute vascular hematoma.  Lab work reveals leukocytosis at 23.1.  Magnesium is low at 1.3.  She does have a new O2 requirement at 3 L O2 via nasal cannula.  Therefore EKG obtained revealing sinus tachycardia at 101 bpm with LBBB.  Chest x-ray without any acute abnormalities.  CT is currently pending for further evaluation of facial swelling. Anticipate she will likely require admission for new O2 requirement as well as facial swelling with increased WBC count. She is HDS at this time. Transfer of care to Dr. Mcneil Sober   Final Clinical Impression(s) / ED Diagnoses Final diagnoses:  Facial swelling    Rx / DC Orders ED Discharge Orders     None         Rhys Martini, DO 12/02/22 1716    Blane Ohara, MD 12/05/22 [phone]

## 2022-12-03 DIAGNOSIS — K1121 Acute sialoadenitis: Secondary | ICD-10-CM | POA: Diagnosis not present

## 2022-12-03 LAB — HEMOGLOBIN A1C
Hgb A1c MFr Bld: 5.9 % — ABNORMAL HIGH (ref 4.8–5.6)
Mean Plasma Glucose: 122.63 mg/dL

## 2022-12-03 LAB — BLOOD GAS, ARTERIAL
Acid-Base Excess: 5.6 mmol/L — ABNORMAL HIGH (ref 0.0–2.0)
Bicarbonate: 30.6 mmol/L — ABNORMAL HIGH (ref 20.0–28.0)
O2 Saturation: 97.4 %
Patient temperature: 34.8
pCO2 arterial: 41 mmHg (ref 32–48)
pH, Arterial: 7.47 — ABNORMAL HIGH (ref 7.35–7.45)
pO2, Arterial: 70 mmHg — ABNORMAL LOW (ref 83–108)

## 2022-12-03 LAB — GLUCOSE, CAPILLARY
Glucose-Capillary: 191 mg/dL — ABNORMAL HIGH (ref 70–99)
Glucose-Capillary: 158 mg/dL — ABNORMAL HIGH (ref 70–99)
Glucose-Capillary: 124 mg/dL — ABNORMAL HIGH (ref 70–99)
Glucose-Capillary: 120 mg/dL — ABNORMAL HIGH (ref 70–99)
Glucose-Capillary: 244 mg/dL — ABNORMAL HIGH (ref 70–99)
Glucose-Capillary: 199 mg/dL — ABNORMAL HIGH (ref 70–99)

## 2022-12-03 LAB — CBC
HCT: 39.3 % (ref 36.0–46.0)
Hemoglobin: 12.1 g/dL (ref 12.0–15.0)
MCH: 25.2 pg — ABNORMAL LOW (ref 26.0–34.0)
MCHC: 30.8 g/dL (ref 30.0–36.0)
MCV: 81.9 fL (ref 80.0–100.0)
Platelets: 160 10*3/uL (ref 150–400)
RBC: 4.8 MIL/uL (ref 3.87–5.11)
RDW: 16.5 % — ABNORMAL HIGH (ref 11.5–15.5)
WBC: 19.4 10*3/uL — ABNORMAL HIGH (ref 4.0–10.5)
nRBC: 0 % (ref 0.0–0.2)

## 2022-12-03 LAB — BASIC METABOLIC PANEL
Anion gap: 12 (ref 5–15)
BUN: 14 mg/dL (ref 8–23)
CO2: 24 mmol/L (ref 22–32)
Calcium: 7.6 mg/dL — ABNORMAL LOW (ref 8.9–10.3)
Chloride: 98 mmol/L (ref 98–111)
Creatinine, Ser: 0.7 mg/dL (ref 0.44–1.00)
GFR, Estimated: >60 mL/min (ref >60)
Glucose, Bld: 143 mg/dL — ABNORMAL HIGH (ref 70–99)
Potassium: 3.2 mmol/L — ABNORMAL LOW (ref 3.5–5.1)
Sodium: 134 mmol/L — ABNORMAL LOW (ref 135–145)

## 2022-12-03 LAB — MAGNESIUM: Magnesium: 2.1 mg/dL (ref 1.7–2.4)

## 2022-12-03 LAB — T4, FREE: Free T4: 1.63 ng/dL — ABNORMAL HIGH (ref 0.61–1.12)

## 2022-12-03 LAB — TSH: TSH: 1.268 u[IU]/mL (ref 0.350–4.500)

## 2022-12-03 MED ORDER — MENTHOL 3 MG MT LOZG
1.0000 | LOZENGE | OROMUCOSAL | Status: DC | PRN
  Filled 2022-12-03: qty 9

## 2022-12-03 MED ORDER — SODIUM CHLORIDE 0.9 % IV SOLN: INTRAVENOUS | Status: DC | PRN

## 2022-12-03 MED ORDER — DEXAMETHASONE SODIUM PHOSPHATE 10 MG/ML IJ SOLN
8.0000 mg | Freq: Three times a day (TID) | INTRAMUSCULAR | Status: AC
  Administered 2022-12-03 – 2022-12-04 (×3): 8 mg via INTRAVENOUS
  Filled 2022-12-03 (×3): qty 1

## 2022-12-03 MED ORDER — ORAL CARE MOUTH RINSE: 15.0000 mL | OROMUCOSAL | Status: DC | PRN

## 2022-12-03 NOTE — Plan of Care (Signed)

## 2022-12-03 NOTE — Progress Notes (Signed)
    Patient Name: Jeanette [name]           DOB: 1940-10-04  MRN: [id number]      Admission Date: 12/02/2022  Attending Provider: John Giovanni, MD  Primary Diagnosis: Acute parotitis   Level of care: Progressive    CROSS COVER NOTE   Date of Service   12/03/2022   Jeanette [name], 82 y.o. female, was admitted on 12/02/2022 for Acute parotitis.    HPI/Events of Note   RN reports patient has a [place] DNR form from SNF.  Patient's daughter, Lyndee Hensen, confirms patient is to remain full code.   Interventions/ Plan   No changes to CODE STATUS.  Remains full code.        Anthoney Harada, DNP, ACNPC- AG [facility]

## 2022-12-03 NOTE — Plan of Care (Signed)

## 2022-12-03 NOTE — Progress Notes (Signed)
Progress Note   Patient: Jeanette [name] FAO:[id number] DOB: [birth year] DOA: 12/02/2022     1 DOS: the patient was seen and examined on 12/03/2022    Subjective:  Seen and examined with swelling and erythema noted to the right parotid area Admits to improvement in the pain denied nausea vomiting abdominal pain Patient has been seen by ENT surgeon Leukocytosis noted ENT surgeon has recommended steroid therapy in addition to antibiotics   Brief hospital course: From HPI "BRIYANNA [name] is a 82 y.o. female with medical history significant of malignant neoplasm of the liver, Afib on Eliquis, asthma, type 2 diabetes, hyperlipidemia, hypertension, history of lumbar fusion and vertebral osteomyelitis in 2019 presented to the ED via EMS from [facility] for evaluation of swelling and redness to the right side of her face which began 2 months ago. Afebrile.  Labs showing WBC 23.1, T. bili 1.7 and remainder of LFTs normal, magnesium 1.3, potassium 3.8.  Chest x-ray showing slight left lung base atelectasis.  CT soft tissue neck showing findings suggestive of severe parotiditis and no drainable fluid collection.  Also showing asymmetrically edematous/thickened appearance of the right supraglottic larynx which is moderately narrowed, possibly infectious and secondary to the right parotid process, malignancy not excluded.  CT also showing an approximately 1.5 cm left thyroid nodule for which ultrasound is recommended for further evaluation.  Critical care evaluated the patient and felt that she was stable for the floor versus progressive care for airway monitoring and IV steroids/antibiotics. EDP discussed the case with the ENT, will consult in the morning.  Patient was given vancomycin, Unasyn, prednisone 40 mg, and IV magnesium 2 g. TRH called to admit.   Patient reports right-sided facial swelling and pain which she thinks started "2 days ago."  Denies any difficulty swallowing or pain with swallowing.   Denies any difficulty breathing.  Denies chest pain.  She has no other complaints and is not able to give any additional history.  "    Assessment and Plan: Severe parotitis Supraglottic airway narrowing WBC 23.1, no fever or signs of sepsis.   And has no wheezing or stridor  [place] maintaining appropriate saturation on room air  Will continue management in progressive unit for closer monitoring  Seen and examined with swelling and erythema noted to the right parotid area Admits to improvement in the pain denied nausea vomiting abdominal pain Patient has been seen by ENT surgeon Leukocytosis noted ENT surgeon has recommended steroid therapy in addition to antibiotics Continue current antibiotics Prednisone have been switched to dexamethasone as recommended by ENT surgeon Plan of care discussed with ENT surgery   Incidental thyroid nodule Follow-up on TSH and free T4  Hypomagnesemia QT prolongation Continue repletion and monitoring   Malignant neoplasm of the liver Per PCP note from 06/08/2022, patient had decided not to pursue further workup or treatment for this problem.  She was referred to palliative care. Follow-up with palliative care   Paroxysmal Afib Currently in sinus rhythm.   Continue metoprolol and Eliquis after pharmacy med rec is done.   Type 2 diabetes Check A1c.  Sensitive sliding scale insulin.   Asthma:  Stable, no signs of acute exacerbation. Hyperlipidemia Hypertension:  Systolic in the 150s. Pharmacy med rec pending.   DVT prophylaxis: Continue Eliquis after pharmacy med rec is done. Code Status: Full Code (discussed with the patient) Family Communication: No family available at this time. Consults called: PCCM, ENT  Level of care: Progressive Care Unit   Physical  Exam: HENT: Nephric and swelling in the right parotid area with erythema    Extraocular Movements: Extraocular movements intact.  Cardiovascular:     Rate and Rhythm: Normal rate and  regular rhythm.     Pulses: Normal pulses.  Pulmonary: No acute respiratory distress Abdominal:     General: Bowel sounds are normal. There is no distension.     Palpations: Abdomen is soft.     Tenderness: There is no abdominal tenderness.  Musculoskeletal:     Cervical back: Normal range of motion.     Right lower leg: No edema.     Left lower leg: No edema.  Skin:    General: Skin is warm and dry.  Neurological:     General: No focal deficit present.     Mental Status: She is alert and oriented to person, place, and time.     Vitals:   12/03/22 0342 12/03/22 0700 12/03/22 0800 12/03/22 1100  BP:  (!) 137/52 126/62 (!) 131/55  Pulse:  83 78 76  Resp:  14 17 15   Temp:  97.7 F (36.5 C)  98 F (36.7 C)  TempSrc:  Oral  Oral  SpO2: 92% 97% 97% 95%  Weight:      Height:        Data Reviewed: I personally reviewed patient's CBC as well as BMP results as shown below Have also reviewed the patient's CT scan of the soft tissue of the neck showing findings of parotitis as well as 1.5 cm left thyroid nodule     Latest Ref Rng & Units 12/03/2022   10:01 AM 12/02/2022    2:02 PM 12/02/2022    1:03 PM  CBC  WBC 4.0 - 10.5 K/uL 19.4   23.1   Hemoglobin 12.0 - 15.0 g/dL 19.1  47.8  29.5   Hematocrit 36.0 - 46.0 % 39.3  41.0  41.3   Platelets 150 - 400 K/uL 160   192        Latest Ref Rng & Units 12/03/2022   10:01 AM 12/02/2022    2:02 PM 12/02/2022    1:03 PM  CMP  Glucose 70 - 99 mg/dL 621  308  657   BUN 8 - 23 mg/dL 14  15  12    Creatinine 0.44 - 1.00 mg/dL 8.46  9.62  9.52   Sodium 135 - 145 mmol/L 134  134  135   Potassium 3.5 - 5.1 mmol/L 3.2  3.9  3.8   Chloride 98 - 111 mmol/L 98  98  95   CO2 22 - 32 mmol/L 24   24   Calcium 8.9 - 10.3 mg/dL 7.6   8.0   Total Protein 6.5 - 8.1 g/dL   5.9   Total Bilirubin 0.3 - 1.2 mg/dL   1.7   Alkaline Phos 38 - 126 U/L   66   AST 15 - 41 U/L   23   ALT 0 - 44 U/L   18     Family Communication: No family member present at  bedside    Author: Loyce Dys, MD 12/03/2022 4:55 PM  For on call review [email or web address].

## 2022-12-03 NOTE — Progress Notes (Signed)
NAME:  Jeanette [name], MRN:  [id number], DOB:  1941/02/18, LOS: 1 ADMISSION DATE:  12/02/2022, CONSULTATION DATE:  8/15 REFERRING MD:  Dr. Anitra Lauth, CHIEF COMPLAINT:  facial swelling   History of Present Illness:  82 year old with PMH as below, which is significant for DM, HTN, HLD, atrial fib, recurrent UTI, and asthma. She presented to [facility] ED 8/15 with complaints of right sided facial swelling for 2 months. She is a resident of [facility]. Upon arrival to the ED she was noted to have right sided facial edema and erythema. C/o increasing pain. Did complain of some difficulty eating and swallowing, but no difficulty breathing. She was sent for head and neck CT to better characterize the inflammation. CT concerning for infection of the parotid gland, but also concerning for airway narrowing at the level of the supraglottic larynx as a result. PCCM asked to evaluate for airway concerns.   Pertinent  Medical History   has a past medical history of Abscess of lower back (09/13/2016), Allergic rhinitis, Anemia (11/11/2017), Aortic atherosclerosis (HCC), Arthralgia of hip (03/01/2017), Asthma, Colon polyp, Depressive disorder, Diabetic neuropathy (HCC), DM2 (diabetes mellitus, type 2) (HCC) (09/13/2016), Encephalopathy (09/13/2016), Fat necrosis of skin (09/13/2016), High cholesterol, History of lumbar fusion (12/06/2017), History of surgical site infection (08/10/2017), History of total left knee replacement (05/12/2018), HTN (hypertension) (09/13/2016), Hyperlipemia, Impaired ambulation (11/14/2017), Infected orthopedic implant, subsequent encounter (05/12/2018), Lumbar degenerative disc disease (03/01/2017), Lumbar radiculopathy (03/01/2017), Obesity, Osteoarthritis, Respiratory failure, acute (HCC) (08/29/2016), and Vertebral osteomyelitis (HCC) (11/10/2017).   Significant Hospital Events: Including procedures, antibiotic start and stop dates in addition to other pertinent events   8/15 admitted for  parotitis with airway narrowing  Interim History / Subjective:  No airway distress  Objective   Blood pressure 126/62, pulse 78, temperature 97.7 F (36.5 C), temperature source Oral, resp. rate 17, height 5\' 5"  (1.651 m), weight 84.6 kg, SpO2 97%.        Intake/Output Summary (Last 24 hours) at 12/03/2022 0844 Last data filed at 12/03/2022 0346 Gross per 24 hour  Intake 683.6 ml  Output --  Net 683.6 ml   Filed Weights   12/02/22 1243  Weight: 84.6 kg    Examination: Awake and alert no acute distress Decreased breath sounds throughout currently on 2 L for saturations of 89% now 97% Welling around parotid glands is noted oropharynx examined shows purulent drainage greater on the left Heart sounds are irregular Abdomen soft nontender Remedies with mild edema  Resolved Hospital Problem list     Assessment & Plan:   Parotitis Airway narrowing, supraglottic Atrial fibrillation  DM Hypertension   Currently in progressive care without airways difficulties Continue IV antibiotics Steroids She has been evaluated by ENT No surgical intervention is required No airway compromise is noted Pulmonary critical care will sign off at this time        Best Practice (right click and "Reselect all SmartList Selections" daily)  Per primary  Labs   CBC: Recent Labs  Lab 12/02/22 1303 12/02/22 1402  WBC 23.1*  --   NEUTROABS 19.7*  --   HGB 12.6 13.9  HCT 41.3 41.0  MCV 82.6  --   PLT 192  --     Basic Metabolic Panel: Recent Labs  Lab 12/02/22 1303 12/02/22 1402  NA 135 134*  K 3.8 3.9  CL 95* 98  CO2 24  --   GLUCOSE 126* 128*  BUN 12 15  CREATININE 0.74 0.60  CALCIUM  8.0*  --   MG 1.3*  --    GFR: Estimated Creatinine Clearance: 61.2 mL/min (by C-G formula based on SCr of 0.6 mg/dL). Recent Labs  Lab 12/02/22 1303  WBC 23.1*    Liver Function Tests: Recent Labs  Lab 12/02/22 1303  AST 23  ALT 18  ALKPHOS 66  BILITOT 1.7*  PROT 5.9*   ALBUMIN 2.5*   No results for input(s): "LIPASE", "AMYLASE" in the last 168 hours. No results for input(s): "AMMONIA" in the last 168 hours.  ABG    Component Value Date/Time   PHART 7.444 08/28/2016 2348   PCO2ART 43.8 08/28/2016 2348   PO2ART 111.0 (H) 08/28/2016 2348   HCO3 30.1 (H) 08/28/2016 2348   TCO2 28 12/02/2022 1402   O2SAT 99.0 08/28/2016 2348     Coagulation Profile: No results for input(s): "INR", "PROTIME" in the last 168 hours.  Cardiac Enzymes: No results for input(s): "CKTOTAL", "CKMB", "CKMBINDEX", "TROPONINI" in the last 168 hours.  HbA1C: Hgb A1c MFr Bld  Date/Time Value Ref Range Status  12/02/2022 11:47 PM 5.9 (H) 4.8 - 5.6 % Final    Comment:    (NOTE) Pre diabetes:          5.7%-6.4%  Diabetes:              >6.4%  Glycemic control for   <7.0% adults with diabetes     CBG: Recent Labs  Lab 12/02/22 2337 12/03/22 0352 12/03/22 0837  GLUCAP 123* 191* 158*      Steve Kavi Almquist ACNP Acute Care Nurse Practitioner [facility] Pul[facility]/Critical Care Please consult Amion 12/03/2022, 8:45 AM

## 2022-12-03 NOTE — Consult Note (Signed)
ENT CONSULT:  Reason for Consult: Parotitis  Referring Physician:  John Giovanni MD  HPI: Jeanette [name] is an 82 y.o. female who presents with right facial swelling. CT consistent with parotitis with associated pharyngitis/supraglottic edema. WBC 23. Afebrile. Started on IV abx.   Pt endorses mild facial pain. No SOB. Started on O2 overnight for desaturations.    Past Medical History:  Diagnosis Date   Abscess of lower back 09/13/2016   Allergic rhinitis    Anemia 11/11/2017   Last Assessment & Plan:  Formatting of this note is different from the original. Lab Results  Component Value Date   WBC 5.8 11/16/2017   HGB 8.9 (L) 11/16/2017   HCT 27.9 (L) 11/16/2017   MCV 78.1 (L) 11/16/2017   PLT 264 11/16/2017   -Transfuse hemoglobin less than 7 - Continue iron supplement   Aortic atherosclerosis (HCC)    Arthralgia of hip 03/01/2017   Asthma    Colon polyp    Depressive disorder    Diabetic neuropathy (HCC)    DM2 (diabetes mellitus, type 2) (HCC) 09/13/2016   Encephalopathy 09/13/2016   Fat necrosis of skin 09/13/2016   High cholesterol    History of lumbar fusion 12/06/2017   History of surgical site infection 08/10/2017   Formatting of this note might be different from the original. Added automatically from request for surgery [id number]   History of total left knee replacement 05/12/2018   HTN (hypertension) 09/13/2016   Hyperlipemia    Impaired ambulation 11/14/2017   Last Assessment & Plan:  Formatting of this note might be different from the original. PT eval PT D/C Recs: 24/7 s/PRN a and HHPT. If appropriate assistance not available at home, rec inpt post acute therapy/low intensity prior to D/C home.   Infected orthopedic implant, subsequent encounter 05/12/2018   Lumbar degenerative disc disease 03/01/2017   Lumbar radiculopathy 03/01/2017   Obesity    Osteoarthritis    Respiratory failure, acute (HCC) 08/29/2016   Vertebral osteomyelitis (HCC) 11/10/2017   Last Assessment &  Plan:  Formatting of this note might be different from the original. Osteomyelitis at L5 and T12 seen on MRI on 7/25  PLAN  -  OSH cultures growing Staph Ludgunensis, oxacillin susceptible. ABX adjusted ID consulted, appreciate recs  -  Oxacillin and Rifampin until 9/15  Ortho consulted - concerned for acute OM - no surgical intervention - IV abx x 6 weeks   IR consulted  - CT gu    Past Surgical History:  Procedure Laterality Date   ABDOMINAL HYSTERECTOMY     APPENDECTOMY     BACK SURGERY     CARDIAC CATHETERIZATION     Normal in 2002 and 2009   CHOLECYSTECTOMY     KNEE SURGERY Bilateral    SHOULDER SURGERY     SPINE SURGERY      Family History  Problem Relation Age of Onset   Hypertension Mother    Diabetes Mother    Stroke Mother    Hypertension Father    Heart disease Father    Diabetes Sister    Cervical cancer Sister    Cancer Brother     Social History:  reports that she has never smoked. She has never used smokeless tobacco. She reports that she does not drink alcohol and does not use drugs.  Allergies:  Allergies  Allergen Reactions   Adenosine    Lidocaine Other (See Comments)    Reaction not recalled   Metoprolol Other (See  Comments)    Reaction not recalled   Oxycodone Other (See Comments)    Overly-sedates the patient   Tramadol Itching   Cefepime Nausea And Vomiting    Tolerates oxacillin  Brand name for Maxipime        Rifampin     vomiting      Medications: I have reviewed the patient's current medications.  Results for orders placed or performed during the hospital encounter of 12/02/22 (from the past 48 hour(s))  CBC with Differential     Status: Abnormal   Collection Time: 12/02/22  1:03 PM  Result Value Ref Range   WBC 23.1 (H) 4.0 - 10.5 K/uL   RBC 5.00 3.87 - 5.11 MIL/uL   Hemoglobin 12.6 12.0 - 15.0 g/dL   HCT 60.4 54.0 - 98.1 %   MCV 82.6 80.0 - 100.0 fL   MCH 25.2 (L) 26.0 - 34.0 pg   MCHC 30.5 30.0 - 36.0 g/dL   RDW 19.1  (H) 47.8 - 15.5 %   Platelets 192 150 - 400 K/uL    Comment: REPEATED TO VERIFY   nRBC 0.0 0.0 - 0.2 %   Neutrophils Relative % 86 %   Neutro Abs 19.7 (H) 1.7 - 7.7 K/uL   Lymphocytes Relative 8 %   Lymphs Abs 1.9 0.7 - 4.0 K/uL   Monocytes Relative 4 %   Monocytes Absolute 1.0 0.1 - 1.0 K/uL   Eosinophils Relative 1 %   Eosinophils Absolute 0.2 0.0 - 0.5 K/uL   Basophils Relative 0 %   Basophils Absolute 0.1 0.0 - 0.1 K/uL   Immature Granulocytes 1 %   Abs Immature Granulocytes 0.28 (H) 0.00 - 0.07 K/uL    Comment: Performed at [facility], [address], [place], [place] [address]  Comprehensive metabolic panel     Status: Abnormal   Collection Time: 12/02/22  1:03 PM  Result Value Ref Range   Sodium 135 135 - 145 mmol/L   Potassium 3.8 3.5 - 5.1 mmol/L    Comment: HEMOLYSIS AT THIS LEVEL MAY AFFECT RESULT   Chloride 95 (L) 98 - 111 mmol/L   CO2 24 22 - 32 mmol/L   Glucose, Bld 126 (H) 70 - 99 mg/dL    Comment: Glucose reference range applies only to samples taken after fasting for at least 8 hours.   BUN 12 8 - 23 mg/dL   Creatinine, Ser 1.30 0.44 - 1.00 mg/dL   Calcium 8.0 (L) 8.9 - 10.3 mg/dL   Total Protein 5.9 (L) 6.5 - 8.1 g/dL   Albumin 2.5 (L) 3.5 - 5.0 g/dL   AST 23 15 - 41 U/L    Comment: HEMOLYSIS AT THIS LEVEL MAY AFFECT RESULT   ALT 18 0 - 44 U/L    Comment: HEMOLYSIS AT THIS LEVEL MAY AFFECT RESULT   Alkaline Phosphatase 66 38 - 126 U/L   Total Bilirubin 1.7 (H) 0.3 - 1.2 mg/dL    Comment: HEMOLYSIS AT THIS LEVEL MAY AFFECT RESULT   GFR, Estimated >60 >60 mL/min    Comment: (NOTE) Calculated using the CKD-EPI Creatinine Equation (2021)    Anion gap 16 (H) 5 - 15    Comment: Performed at [facility], [address], [place], [place] [address]  Magnesium     Status: Abnormal   Collection Time: 12/02/22  1:03 PM  Result Value Ref Range   Magnesium 1.3 (L) 1.7 - 2.4 mg/dL    Comment: Performed at [facility], [address],  [place], [place] [address]  I-stat chem 8, ED (not at [facility], [facility] or [facility])     Status: Abnormal   Collection Time: 12/02/22  2:02 PM  Result Value Ref Range   Sodium 134 (L) 135 - 145 mmol/L   Potassium 3.9 3.5 - 5.1 mmol/L   Chloride 98 98 - 111 mmol/L   BUN 15 8 - 23 mg/dL   Creatinine, Ser 6.04 0.44 - 1.00 mg/dL   Glucose, Bld 540 (H) 70 - 99 mg/dL    Comment: Glucose reference range applies only to samples taken after fasting for at least 8 hours.   Calcium, Ion 0.92 (L) 1.15 - 1.40 mmol/L   TCO2 28 22 - 32 mmol/L   Hemoglobin 13.9 12.0 - 15.0 g/dL   HCT 98.1 19.1 - 47.8 %  CBG monitoring, ED     Status: Abnormal   Collection Time: 12/02/22 11:37 PM  Result Value Ref Range   Glucose-Capillary 123 (H) 70 - 99 mg/dL    Comment: Glucose reference range applies only to samples taken after fasting for at least 8 hours.  Hemoglobin A1c     Status: Abnormal   Collection Time: 12/02/22 11:47 PM  Result Value Ref Range   Hgb A1c MFr Bld 5.9 (H) 4.8 - 5.6 %    Comment: (NOTE) Pre diabetes:          5.7%-6.4%  Diabetes:              >6.4%  Glycemic control for   <7.0% adults with diabetes    Mean Plasma Glucose 122.63 mg/dL    Comment: Performed at [facility], [address] [place], [place] [address]  TSH     Status: None   Collection Time: 12/02/22 11:47 PM  Result Value Ref Range   TSH 1.268 0.350 - 4.500 uIU/mL    Comment: Performed by a 3rd Generation assay with a functional sensitivity of <=0.01 uIU/mL. Performed at [facility], [address], [place], [place] [address]   T4, free     Status: Abnormal   Collection Time: 12/02/22 11:47 PM  Result Value Ref Range   Free T4 1.63 (H) 0.61 - 1.12 ng/dL    Comment: (NOTE) Biotin ingestion may interfere with free T4 tests. If the results are inconsistent with the TSH level, previous test results, or the clinical presentation, then consider biotin interference. If needed, order repeat testing after stopping  biotin. Performed at [facility], [address], [place], [place] [address]   Glucose, capillary     Status: Abnormal   Collection Time: 12/03/22  3:52 AM  Result Value Ref Range   Glucose-Capillary 191 (H) 70 - 99 mg/dL    Comment: Glucose reference range applies only to samples taken after fasting for at least 8 hours.    US THYROID  Result Date: 12/03/2022 CLINICAL DATA:  Thyroid nodule EXAM: THYROID ULTRASOUND TECHNIQUE: Ultrasound examination of the thyroid gland and adjacent soft tissues was performed. COMPARISON:  CT neck 12/02/2022 FINDINGS: Parenchymal Echotexture: Mildly heterogenous Isthmus: 0.6 cm Right lobe: 5.7 x 1.6 x 1.9 cm Left lobe: 3.9 x 1.8 x 1.2 cm _________________________________________________________ Estimated total number of nodules >/= 1 cm: 2 Number of spongiform nodules >/=  2 cm not described below (TR1): 0 Number of mixed cystic and solid nodules >/= 1.5 cm not described below (TR2): 0 _________________________________________________________ Nodule 1: 0.7 cm cystic nodule in the superior  right thyroid lobe does not meet criteria for imaging surveillance or FNA. _________________________________________________________ Nodule # 2: Location: Right; Mid Maximum size: 1.8 cm; Other 2 dimensions: 1.2 x 1.3 cm Composition: solid/almost completely solid (2) Echogenicity: isoechoic (1) Shape: not taller-than-wide (0) Margins: ill-defined (0) Echogenic foci: none (0) ACR TI-RADS total points: 3. ACR TI-RADS risk category: TR3 (3 points). ACR TI-RADS recommendations: *Given size (>/= 1.5 - 2.4 cm) and appearance, a follow-up ultrasound in 1 year should be considered based on TI-RADS criteria. _________________________________________________________ Nodule # 3: Location: Left; Mid Maximum size: 2.1 cm; Other 2 dimensions: 1.3 x 1.5 cm Composition: solid/almost completely solid (2) Echogenicity: isoechoic (1) Shape: not taller-than-wide (0) Margins: ill-defined (0)  Echogenic foci: none (0) ACR TI-RADS total points: 3. ACR TI-RADS risk category: TR3 (3 points). ACR TI-RADS recommendations: *Given size (>/= 1.5 - 2.4 cm) and appearance, a follow-up ultrasound in 1 year should be considered based on TI-RADS criteria. Nodule corresponds to the abnormality seen on neck CT. IMPRESSION: Nodules 2 and 3 meet criteria for imaging follow-up. Annual ultrasound surveillance is recommended until 5 years of stability is documented. The above is in keeping with the ACR TI-RADS recommendations - J Am Coll Radiol 2017;14:587-595. Electronically Signed   By: Acquanetta Belling M.D.   On: 12/03/2022 07:37   CT Soft Tissue Neck W Contrast  Result Date: 12/02/2022 CLINICAL DATA:  Soft tissue infection suspected, neck, no prior imaging facial swelling EXAM: CT NECK WITH CONTRAST TECHNIQUE: Multidetector CT imaging of the neck was performed using the standard protocol following the bolus administration of intravenous contrast. RADIATION DOSE REDUCTION: This exam was performed according to the departmental dose-optimization program which includes automated exposure control, adjustment of the mA and/or kV according to patient size and/or use of iterative reconstruction technique. CONTRAST:  75mL OMNIPAQUE IOHEXOL 350 MG/ML SOLN COMPARISON:  None Available. FINDINGS: Pharynx and larynx: Asymmetrically edematous/thickened appearance of the right supraglottic larynx (for example series 3, image 70) which is moderately narrowed. Salivary glands: Enlarged/edematous right parotid gland with surrounding edema. Nodularity of the right parotid gland. Left parotid gland and submandibular glands are unremarkable. Thyroid: Approximately 1.5 cm left thyroid nodule. Lymph nodes: Multiple areas of nodular in the right midgland, likely prominent lymph nodes. Mildly prominent right upper cervical chain nodes. Vascular: Aortic atherosclerosis. Limited intracranial: Negative. Visualized orbits: Negative. Mastoids and  visualized paranasal sinuses: Clear. Skeleton: No acute abnormality.  Multilevel degenerative change. Upper chest: Visualized lung apices are clear. IMPRESSION: 1. Enlarged/edematous right parotid gland with surrounding edema, suggestive of severe parotiditis. Nodularity within the right parotid likely represents inflamed lymph nodes. No drainable fluid collection. 2. Asymmetrically edematous/thickened appearance of the right supraglottic larynx which is moderately narrowed, probably infectious and secondary to the right parotid process. Correlation with direct visualization is recommended to exclude malignancy. 3. Approximately 1.5 cm left thyroid nodule. Recommend thyroid US (ref: J Am Coll Radiol. 2015 Feb;12(2): 143-50). Electronically Signed   By: Feliberto Harts M.D.   On: 12/02/2022 18:03   DG Chest 2 View  Result Date: 12/02/2022 CLINICAL DATA:  Hypoxia EXAM: CHEST - 2 VIEW COMPARISON:  None Available. FINDINGS: The heart size and mediastinal contours are within normal limits. Slight linear opacity left lung base likely scar or atelectasis. No consolidation, pneumothorax or effusion. No edema. Calcified and tortuous aorta. The visualized skeletal structures are unremarkable. Degenerative changes along the spine. Underinflation. IMPRESSION: Underinflation.  Slight left lung base atelectasis. Electronically Signed   By: Karen Kays M.D.   On: 12/02/2022  15:07    GMW:[id number] other than stated per HPI  Blood pressure (!) 137/52, pulse 83, temperature 97.7 F (36.5 C), temperature source Oral, resp. rate 14, height 5\' 5"  (1.651 m), weight 84.6 kg, SpO2 97%.  PHYSICAL EXAM:  CONSTITUTIONAL: well developed, nourished, no distress and alert and oriented x 3. NO Stridor or increased work of breathing. Voice normal.  EYES: PERRL, EOMI  HENT: Head : normocephalic and atraumatic. Significant right facial swelling c/w acute parotitis. Mild tenderness.  Ears: Right ear:   canal normal, external ear  normal and hearing normal Left ear:   canal normal, external ear normal and hearing normal Nose: nose normal and no purulence Mouth/Throat:  Mouth: uvula midline and no oral lesions. No pus draining from stensons duct.  Throat: oropharynx clear and moist NECK: supple, trachea normal and no thyromegaly or cervical LAD NEURO: CN II-XII symmetric intact   Studies Reviewed:CT Maxillofacial  Assessment/Plan: Right parotitis without abscess. No respiratory symptoms. Overnight desaturations likely secondary to underlying OSA.  Recommendations: - Continue IV Abx - Consider 24 hour course of systemic steroids if OK from medical standpoint (I.e. Decadron 8-10 mg q8 x 3 doses) - Warm compresses to right face every 30 minutes - Sialogogues (Sour candy, lemon drops, lemon wedges hourly - Encourage vigorous hydration  Dispo: medicine wards.   I have personally spent 31 minutes involved in face-to-face and non-face-to-face activities for this patient on the day of the visit.  Professional time spent includes the following activities, in addition to those noted in the documentation: preparing to see the patient (eg, review of tests), obtaining and/or reviewing separately obtained history, performing a medically appropriate examination and/or evaluation, counseling and educating the patient/family/caregiver, ordering medications, tests or procedures, referring and communicating with other healthcare professionals, documenting clinical information in the electronic or other health record, independently interpreting results and communicating results with the patient/family/caregiver, care coordination.  Electronically signed by:  Scarlette Ar, MD  Staff Physician Facial Plastic & Reconstructive Surgery Otolaryngology - Head and Neck Surgery [facility] [place] [facility]   12/03/2022, 8:16 AM

## 2022-12-03 NOTE — Plan of Care (Signed)
  Problem: Education: Goal: Ability to describe self-care measures that may prevent or decrease complications (Diabetes Survival Skills Education) will improve 12/03/2022 0408 by Yvone Neu, RN Outcome: Progressing 12/03/2022 0408 by Yvone Neu, RN Outcome: Progressing Goal: Individualized Educational Video(s) 12/03/2022 0408 by Yvone Neu, RN Outcome: Progressing 12/03/2022 0408 by Yvone Neu, RN Outcome: Progressing   Problem: Coping: Goal: Ability to adjust to condition or change in health will improve 12/03/2022 0408 by Yvone Neu, RN Outcome: Progressing 12/03/2022 0408 by Yvone Neu, RN Outcome: Progressing   Problem: Fluid Volume: Goal: Ability to maintain a balanced intake and output will improve 12/03/2022 0408 by Yvone Neu, RN Outcome: Progressing 12/03/2022 0408 by Yvone Neu, RN Outcome: Progressing   Problem: Health Behavior/Discharge Planning: Goal: Ability to identify and utilize available resources and services will improve 12/03/2022 0408 by Yvone Neu, RN Outcome: Progressing 12/03/2022 0408 by Yvone Neu, RN Outcome: Progressing Goal: Ability to manage health-related needs will improve 12/03/2022 0408 by Yvone Neu, RN Outcome: Progressing 12/03/2022 0408 by Yvone Neu, RN Outcome: Progressing   Problem: Metabolic: Goal: Ability to maintain appropriate glucose levels will improve 12/03/2022 0408 by Yvone Neu, RN Outcome: Progressing 12/03/2022 0408 by Yvone Neu, RN Outcome: Progressing   Problem: Nutritional: Goal: Maintenance of adequate nutrition will improve 12/03/2022 0408 by Yvone Neu, RN Outcome: Progressing 12/03/2022 0408 by Yvone Neu, RN Outcome: Progressing Goal: Progress toward achieving an optimal weight will improve 12/03/2022 0408 by Yvone Neu, RN Outcome: Progressing 12/03/2022 0408 by Yvone Neu, RN Outcome: Progressing   Problem:  Skin Integrity: Goal: Risk for impaired skin integrity will decrease 12/03/2022 0408 by Yvone Neu, RN Outcome: Progressing 12/03/2022 0408 by Yvone Neu, RN Outcome: Progressing   Problem: Tissue Perfusion: Goal: Adequacy of tissue perfusion will improve 12/03/2022 0408 by Yvone Neu, RN Outcome: Progressing 12/03/2022 0408 by Yvone Neu, RN Outcome: Progressing

## 2022-12-03 NOTE — Progress Notes (Signed)
2150 RN reached out to [facility]ad on-call Omelia Blackwater and discussed pt. O2 sat decreasing to the 60s when sleeping. RN elevated HOB 30 degrees and increased O2 to 5L. Kyere NP ordered a blood gas and encouraged RN to reach out to the ENT to let them know pt is on 5L.   2200 RN reached out to Dr. Jenne Pane ENT, and discussed the situation of the pt. oxygen. No new orders from him at this time.

## 2022-12-03 NOTE — Progress Notes (Signed)
Paged on call provider, pt came to floor with golden rod form.  Need code status updated.

## 2022-12-03 NOTE — ED Notes (Signed)
ED TO INPATIENT HANDOFF REPORT  ED Nurse Name and Phone #: Raquel Sarna [phone]  S Name/Age/Gender Jeanette [name] 82 y.o. female Room/Bed: H014C/H014C  Code Status   Code Status: Full Code  Home/SNF/Other Skilled nursing facility Patient oriented to: self and place Is this baseline? Yes   Triage Complete: Triage complete  Chief Complaint Acute parotitis [K11.21]  Triage Note Pt bib ems from [facility] c/o cyst on right side of face. Last night the cyst was the size of a golf ball and today it has grown to the size of a soft ball. Pt denies any recent dental surgeries, fevers, or illness. Pt states she has a hard time swallowing and haven't had anything PO since 12/01/22. Pt states she is dizzy and a headache.  EMS state pt is ambulatory and complete tasks on her own but it was hard to get pt up.   BP 164/90 HR 84 CBG 132 RA 89% placed on 3L nasal canula 96%   Allergies Allergies  Allergen Reactions   Adenosine    Lidocaine Other (See Comments)    Reaction not recalled   Metoprolol Other (See Comments)    Reaction not recalled   Oxycodone Other (See Comments)    Overly-sedates the patient   Tramadol Itching   Cefepime Nausea And Vomiting    Tolerates oxacillin  Brand name for Maxipime        Rifampin     vomiting      Level of Care/Admitting Diagnosis ED Disposition     ED Disposition  Admit   Condition  --   Comment  Hospital [facility]: [facility] [[id number]]  Level of Care: Progressive [102]  Admit to Progressive based on following criteria: RESPIRATORY PROBLEMS hypoxemic/hypercapnic respiratory failure that is responsive to NIPPV (BiPAP) or High Flow Nasal Cannula (6-80 lpm). Frequent assessment/intervention, no > Q2 hrs < Q4 hrs, to maintain oxygenation and pulmonary hygiene.  May admit patient to [facility] or [facility] if equivalent level of care is available:: Yes  Covid Evaluation: Asymptomatic - no recent exposure (last 10  days) testing not required  Diagnosis: Acute parotitis [956387]  Admitting Physician: John Giovanni [[id number]]  Attending Physician: John Giovanni [[id number]]  Certification:: I certify this patient will need inpatient services for at least 2 midnights  Expected Medical Readiness: 12/04/2022          B Medical/Surgery History Past Medical History:  Diagnosis Date   Abscess of lower back 09/13/2016   Allergic rhinitis    Anemia 11/11/2017   Last Assessment & Plan:  Formatting of this note is different from the original. Lab Results  Component Value Date   WBC 5.8 11/16/2017   HGB 8.9 (L) 11/16/2017   HCT 27.9 (L) 11/16/2017   MCV 78.1 (L) 11/16/2017   PLT 264 11/16/2017   -Transfuse hemoglobin less than 7 - Continue iron supplement   Aortic atherosclerosis (HCC)    Arthralgia of hip 03/01/2017   Asthma    Colon polyp    Depressive disorder    Diabetic neuropathy (HCC)    DM2 (diabetes mellitus, type 2) (HCC) 09/13/2016   Encephalopathy 09/13/2016   Fat necrosis of skin 09/13/2016   High cholesterol    History of lumbar fusion 12/06/2017   History of surgical site infection 08/10/2017   Formatting of this note might be different from the original. Added automatically from request for surgery [id number]   History of total left knee replacement 05/12/2018   HTN (hypertension)  09/13/2016   Hyperlipemia    Impaired ambulation 11/14/2017   Last Assessment & Plan:  Formatting of this note might be different from the original. PT eval PT D/C Recs: 24/7 s/PRN a and HHPT. If appropriate assistance not available at home, rec inpt post acute therapy/low intensity prior to D/C home.   Infected orthopedic implant, subsequent encounter 05/12/2018   Lumbar degenerative disc disease 03/01/2017   Lumbar radiculopathy 03/01/2017   Obesity    Osteoarthritis    Respiratory failure, acute (HCC) 08/29/2016   Vertebral osteomyelitis (HCC) 11/10/2017   Last Assessment & Plan:  Formatting of this note might  be different from the original. Osteomyelitis at L5 and T12 seen on MRI on 7/25  PLAN  -  OSH cultures growing Staph Ludgunensis, oxacillin susceptible. ABX adjusted ID consulted, appreciate recs  -  Oxacillin and Rifampin until 9/15  Ortho consulted - concerned for acute OM - no surgical intervention - IV abx x 6 weeks   IR consulted  - CT gu   Past Surgical History:  Procedure Laterality Date   ABDOMINAL HYSTERECTOMY     APPENDECTOMY     BACK SURGERY     CARDIAC CATHETERIZATION     Normal in 2002 and 2009   CHOLECYSTECTOMY     KNEE SURGERY Bilateral    SHOULDER SURGERY     SPINE SURGERY       A IV Location/Drains/Wounds Patient Lines/Drains/Airways Status     Active Line/Drains/Airways     Name Placement date Placement time Site Days   Peripheral IV 12/02/22 20 G Left;Posterior Forearm 12/02/22  --  Forearm  1   Peripheral IV 12/02/22 20 G 1" Anterior;Right Forearm 12/02/22  2352  Forearm  1            Intake/Output Last 24 hours  Intake/Output Summary (Last 24 hours) at 12/03/2022 0239 Last data filed at 12/03/2022 0200 Gross per 24 hour  Intake 491.07 ml  Output --  Net 491.07 ml    Labs/Imaging Results for orders placed or performed during the hospital encounter of 12/02/22 (from the past 48 hour(s))  CBC with Differential     Status: Abnormal   Collection Time: 12/02/22  1:03 PM  Result Value Ref Range   WBC 23.1 (H) 4.0 - 10.5 K/uL   RBC 5.00 3.87 - 5.11 MIL/uL   Hemoglobin 12.6 12.0 - 15.0 g/dL   HCT 16.1 09.6 - 04.5 %   MCV 82.6 80.0 - 100.0 fL   MCH 25.2 (L) 26.0 - 34.0 pg   MCHC 30.5 30.0 - 36.0 g/dL   RDW 40.9 (H) 81.1 - 91.4 %   Platelets 192 150 - 400 K/uL    Comment: REPEATED TO VERIFY   nRBC 0.0 0.0 - 0.2 %   Neutrophils Relative % 86 %   Neutro Abs 19.7 (H) 1.7 - 7.7 K/uL   Lymphocytes Relative 8 %   Lymphs Abs 1.9 0.7 - 4.0 K/uL   Monocytes Relative 4 %   Monocytes Absolute 1.0 0.1 - 1.0 K/uL   Eosinophils Relative 1 %   Eosinophils  Absolute 0.2 0.0 - 0.5 K/uL   Basophils Relative 0 %   Basophils Absolute 0.1 0.0 - 0.1 K/uL   Immature Granulocytes 1 %   Abs Immature Granulocytes 0.28 (H) 0.00 - 0.07 K/uL    Comment: Performed at [facility], [address] [place], [place] [address]  Comprehensive metabolic panel     Status: Abnormal  Collection Time: 12/02/22  1:03 PM  Result Value Ref Range   Sodium 135 135 - 145 mmol/L   Potassium 3.8 3.5 - 5.1 mmol/L    Comment: HEMOLYSIS AT THIS LEVEL MAY AFFECT RESULT   Chloride 95 (L) 98 - 111 mmol/L   CO2 24 22 - 32 mmol/L   Glucose, Bld 126 (H) 70 - 99 mg/dL    Comment: Glucose reference range applies only to samples taken after fasting for at least 8 hours.   BUN 12 8 - 23 mg/dL   Creatinine, Ser 1.61 0.44 - 1.00 mg/dL   Calcium 8.0 (L) 8.9 - 10.3 mg/dL   Total Protein 5.9 (L) 6.5 - 8.1 g/dL   Albumin 2.5 (L) 3.5 - 5.0 g/dL   AST 23 15 - 41 U/L    Comment: HEMOLYSIS AT THIS LEVEL MAY AFFECT RESULT   ALT 18 0 - 44 U/L    Comment: HEMOLYSIS AT THIS LEVEL MAY AFFECT RESULT   Alkaline Phosphatase 66 38 - 126 U/L   Total Bilirubin 1.7 (H) 0.3 - 1.2 mg/dL    Comment: HEMOLYSIS AT THIS LEVEL MAY AFFECT RESULT   GFR, Estimated >60 >60 mL/min    Comment: (NOTE) Calculated using the CKD-EPI Creatinine Equation (2021)    Anion gap 16 (H) 5 - 15    Comment: Performed at [facility], [address], [place], [place] [address]  Magnesium     Status: Abnormal   Collection Time: 12/02/22  1:03 PM  Result Value Ref Range   Magnesium 1.3 (L) 1.7 - 2.4 mg/dL    Comment: Performed at [facility], [address] [place], [place] [address]  I-stat chem 8, ED (not at [facility], [facility] or [facility])     Status: Abnormal   Collection Time: 12/02/22  2:02 PM  Result Value Ref Range   Sodium 134 (L) 135 - 145 mmol/L   Potassium 3.9 3.5 - 5.1 mmol/L   Chloride 98 98 - 111 mmol/L   BUN 15 8 - 23 mg/dL   Creatinine, Ser 1.19 0.44 - 1.00 mg/dL   Glucose, Bld 147 (H) 70 - 99  mg/dL    Comment: Glucose reference range applies only to samples taken after fasting for at least 8 hours.   Calcium, Ion 0.92 (L) 1.15 - 1.40 mmol/L   TCO2 28 22 - 32 mmol/L   Hemoglobin 13.9 12.0 - 15.0 g/dL   HCT 82.9 56.2 - 13.0 %  CBG monitoring, ED     Status: Abnormal   Collection Time: 12/02/22 11:37 PM  Result Value Ref Range   Glucose-Capillary 123 (H) 70 - 99 mg/dL    Comment: Glucose reference range applies only to samples taken after fasting for at least 8 hours.  Hemoglobin A1c     Status: Abnormal   Collection Time: 12/02/22 11:47 PM  Result Value Ref Range   Hgb A1c MFr Bld 5.9 (H) 4.8 - 5.6 %    Comment: (NOTE) Pre diabetes:          5.7%-6.4%  Diabetes:              >6.4%  Glycemic control for   <7.0% adults with diabetes    Mean Plasma Glucose 122.63 mg/dL    Comment: Performed at [facility], [address], [place], [place] [address]  TSH     Status: None   Collection Time: 12/02/22 11:47 PM  Result Value Ref Range   TSH  1.268 0.350 - 4.500 uIU/mL    Comment: Performed by a 3rd Generation assay with a functional sensitivity of <=0.01 uIU/mL. Performed at [facility], [address], [place], [place] [address]   T4, free     Status: Abnormal   Collection Time: 12/02/22 11:47 PM  Result Value Ref Range   Free T4 1.63 (H) 0.61 - 1.12 ng/dL    Comment: (NOTE) Biotin ingestion may interfere with free T4 tests. If the results are inconsistent with the TSH level, previous test results, or the clinical presentation, then consider biotin interference. If needed, order repeat testing after stopping biotin. Performed at [facility], [address], [place], [place] [address]    CT Soft Tissue Neck W Contrast  Result Date: 12/02/2022 CLINICAL DATA:  Soft tissue infection suspected, neck, no prior imaging facial swelling EXAM: CT NECK WITH CONTRAST TECHNIQUE: Multidetector CT imaging of the neck was performed using the standard protocol  following the bolus administration of intravenous contrast. RADIATION DOSE REDUCTION: This exam was performed according to the departmental dose-optimization program which includes automated exposure control, adjustment of the mA and/or kV according to patient size and/or use of iterative reconstruction technique. CONTRAST:  75mL OMNIPAQUE IOHEXOL 350 MG/ML SOLN COMPARISON:  None Available. FINDINGS: Pharynx and larynx: Asymmetrically edematous/thickened appearance of the right supraglottic larynx (for example series 3, image 70) which is moderately narrowed. Salivary glands: Enlarged/edematous right parotid gland with surrounding edema. Nodularity of the right parotid gland. Left parotid gland and submandibular glands are unremarkable. Thyroid: Approximately 1.5 cm left thyroid nodule. Lymph nodes: Multiple areas of nodular in the right midgland, likely prominent lymph nodes. Mildly prominent right upper cervical chain nodes. Vascular: Aortic atherosclerosis. Limited intracranial: Negative. Visualized orbits: Negative. Mastoids and visualized paranasal sinuses: Clear. Skeleton: No acute abnormality.  Multilevel degenerative change. Upper chest: Visualized lung apices are clear. IMPRESSION: 1. Enlarged/edematous right parotid gland with surrounding edema, suggestive of severe parotiditis. Nodularity within the right parotid likely represents inflamed lymph nodes. No drainable fluid collection. 2. Asymmetrically edematous/thickened appearance of the right supraglottic larynx which is moderately narrowed, probably infectious and secondary to the right parotid process. Correlation with direct visualization is recommended to exclude malignancy. 3. Approximately 1.5 cm left thyroid nodule. Recommend thyroid US (ref: J Am Coll Radiol. 2015 Feb;12(2): 143-50). Electronically Signed   By: Feliberto Harts M.D.   On: 12/02/2022 18:03   DG Chest 2 View  Result Date: 12/02/2022 CLINICAL DATA:  Hypoxia EXAM: CHEST - 2 VIEW  COMPARISON:  None Available. FINDINGS: The heart size and mediastinal contours are within normal limits. Slight linear opacity left lung base likely scar or atelectasis. No consolidation, pneumothorax or effusion. No edema. Calcified and tortuous aorta. The visualized skeletal structures are unremarkable. Degenerative changes along the spine. Underinflation. IMPRESSION: Underinflation.  Slight left lung base atelectasis. Electronically Signed   By: Karen Kays M.D.   On: 12/02/2022 15:07    Pending Labs Unresulted Labs (From admission, onward)     Start     Ordered   12/03/22 0500  CBC  Tomorrow morning,   R        12/02/22 2228   12/03/22 0500  Magnesium  Tomorrow morning,   R        12/02/22 2228   12/03/22 0500  Basic metabolic panel  Tomorrow morning,   R        12/02/22 2228            Vitals/Pain Today's  Vitals   12/02/22 1655 12/02/22 1802 12/02/22 1905 12/03/22 0238  BP: (!) 150/58  (!) 154/89 (!) 148/92  Pulse: 90  91 89  Resp: 16  16 16   Temp: 99 F (37.2 C)  98.2 F (36.8 C) 98.2 F (36.8 C)  TempSrc: Oral  Oral Oral  SpO2: (!) 89%  90% 97%  Weight:      Height:      PainSc:  2   0-No pain    Isolation Precautions No active isolations  Medications Medications  Ampicillin-Sulbactam (UNASYN) 3 g in sodium chloride 0.9 % 100 mL IVPB (3 g Intravenous New Bag/Given 12/03/22 0204)  vancomycin (VANCOCIN) IVPB 1000 mg/200 mL premix (has no administration in time range)  predniSONE (DELTASONE) tablet 40 mg (has no administration in time range)  acetaminophen (TYLENOL) tablet 650 mg (has no administration in time range)    Or  acetaminophen (TYLENOL) suppository 650 mg (has no administration in time range)  insulin aspart (novoLOG) injection 0-9 Units (1 Units Subcutaneous Given 12/02/22 2358)  dextrose 50 % solution 12.5 g (has no administration in time range)  potassium chloride 10 mEq in 100 mL IVPB (10 mEq Intravenous New Bag/Given 12/03/22 0203)  magnesium  sulfate IVPB 2 g 50 mL (0 g Intravenous Stopped 12/02/22 1606)  iohexol (OMNIPAQUE) 350 MG/ML injection 75 mL (75 mLs Intravenous Contrast Given 12/02/22 1543)  Ampicillin-Sulbactam (UNASYN) 3 g in sodium chloride 0.9 % 100 mL IVPB (0 g Intravenous Stopped 12/02/22 2048)  predniSONE (DELTASONE) tablet 40 mg (40 mg Oral Given 12/02/22 2124)  vancomycin (VANCOREADY) IVPB 1750 mg/350 mL (0 mg Intravenous Stopped 12/02/22 2348)  magnesium sulfate IVPB 2 g 50 mL (0 g Intravenous Stopped 12/03/22 0200)    Mobility walks with person assist     Focused Assessments Neuro Assessment Handoff:  Swallow screen pass? Yes    NIH Stroke Scale  Dizziness Present: No Headache Present: No Interval: Initial Level of Consciousness (1a.)   : Alert, keenly responsive LOC Questions (1b. )   : Answers both questions correctly LOC Commands (1c. )   : Performs both tasks correctly Best Gaze (2. )  : Normal Visual (3. )  : No visual loss Facial Palsy (4. )    : Normal symmetrical movements Motor Arm, Left (5a. )   : No drift Motor Arm, Right (5b. ) : No drift Motor Leg, Left (6a. )  : No drift Motor Leg, Right (6b. ) : No drift Limb Ataxia (7. ): Absent Sensory (8. )  : Normal, no sensory loss Best Language (9. )  : No aphasia Dysarthria (10. ): Normal Extinction/Inattention (11.)   : No Abnormality Complete NIHSS TOTAL: 0     Neuro Assessment: Exceptions to WDL Neuro Checks:   Initial (12/02/22 1242)  Has TPA been given? No If patient is a Neuro Trauma and patient is going to OR before floor call report to 4N Charge nurse: [phone] or [phone]   R Recommendations: See Admitting Provider Note  Report given to:   Additional Notes: Pt a/o x 2, able to ambulate with assistance

## 2022-12-03 NOTE — TOC Initial Note (Signed)
Transition of Care [facility]) - Initial/Assessment Note    Patient Details  Name: Jeanette [name] MRN: [id number] Date of Birth: 03/19/1941  Transition of Care [facility]) CM/SW Contact:    Deatra Robinson, [place] Phone Number: 12/03/2022, 3:15 PM  Clinical Narrative:  pt admitted from [facility] [place]. Spoke to [place] in admissions who confirmed pt is a LTC SNF resident at able to return at dc. SW will follow.     Dellie Burns, MSW, LCSW [phone] (coverage)                   Expected Discharge Plan: Skilled Nursing Facility     Patient Goals and CMS Choice            Expected Discharge Plan and Services       Living arrangements for the past 2 months: Skilled Nursing Facility                                      Prior Living Arrangements/Services Living arrangements for the past 2 months: Skilled Nursing Facility Lives with:: Facility Resident          Need for Family Participation in Patient Care: Yes (Comment) Care giver support system in place?: Yes (comment)   Criminal Activity/Legal Involvement Pertinent to Current Situation/Hospitalization: No - Comment as needed  Activities of Daily Living      Permission Sought/Granted                  Emotional Assessment       Orientation: : Oriented to Self, Fluctuating Orientation (Suspected and/or reported Sundowners) Alcohol / Substance Use: Not Applicable Psych Involvement: No (comment)  Admission diagnosis:  Facial swelling [R22.0] Acute parotitis [K11.21] Patient Active Problem List   Diagnosis Date Noted   Acute parotitis 12/02/2022   Thyroid nodule 12/02/2022   Hypomagnesemia 12/02/2022   QT prolongation 12/02/2022   Malignant neoplasm of liver (HCC) 06/08/2022   Urinary tract infection without hematuria 05/12/2022   Recurrent UTI 04/26/2022   Liver lesion 04/26/2022   Liver abscess 03/29/2022   Debility 03/29/2022   Aortic stenosis, mild 03/16/2021   Mitral regurgitation  03/16/2021   Osteoarthritis    Obesity    Hyperlipemia    High cholesterol    Diabetic neuropathy (HCC)    Depressive disorder    Colon polyp    Asthma    Aortic atherosclerosis (HCC)    Allergic rhinitis    History of total left knee replacement 05/12/2018   Infected orthopedic implant, subsequent encounter 05/12/2018   History of lumbar fusion 12/06/2017   Impaired ambulation 11/14/2017   Anemia 11/11/2017   Vertebral osteomyelitis (HCC) 11/10/2017   History of surgical site infection 08/10/2017   Arthralgia of hip 03/01/2017   Lumbar degenerative disc disease 03/01/2017   Lumbar radiculopathy 03/01/2017   Encephalopathy 09/13/2016   DM2 (diabetes mellitus, type 2) (HCC) 09/13/2016   HTN (hypertension) 09/13/2016   Abscess of lower back 09/13/2016   Fat necrosis of skin 09/13/2016   Respiratory failure, acute (HCC) 08/29/2016   PCP:  Crist Fat, MD Pharmacy:   [facility] [phone] [place], [place] - [address] [place] [address] Phone: [phone] Fax: [phone]  [id number] [place], [place] - [address] AT [address] [place] [address] Phone: [phone] Fax: [phone]  [facility] -  [place], [place] - [address] [place] [address] Phone: [phone] Fax: [phone]     Social Determinants of Health (SDOH) Social History: SDOH Screenings   Transportation Needs: No Transportation Needs (01/14/2022)   Received from [facility]  Depression [phone]): Low Risk  (06/23/2018)  Financial Resource Strain: Low Risk  (01/13/2022)   Received from [facility]  Social Connections: Unknown (01/13/2022)   Received from [facility]  Stress: No Stress Concern Present (01/13/2022)   Received from [facility]  Tobacco Use: Low Risk  (12/02/2022)   SDOH Interventions:     Readmission Risk Interventions      No data to display

## 2022-12-03 NOTE — Evaluation (Signed)
Clinical/Bedside Swallow Evaluation Patient Details  Name: Jeanette [name] MRN: [id number] Date of Birth: 04-02-1941  Today's Date: 12/03/2022 Time: SLP Start Time (ACUTE ONLY): 1225 SLP Stop Time (ACUTE ONLY): 1244 SLP Time Calculation (min) (ACUTE ONLY): 19 min  Past Medical History:  Past Medical History:  Diagnosis Date   Abscess of lower back 09/13/2016   Allergic rhinitis    Anemia 11/11/2017   Last Assessment & Plan:  Formatting of this note is different from the original. Lab Results  Component Value Date   WBC 5.8 11/16/2017   HGB 8.9 (L) 11/16/2017   HCT 27.9 (L) 11/16/2017   MCV 78.1 (L) 11/16/2017   PLT 264 11/16/2017   -Transfuse hemoglobin less than 7 - Continue iron supplement   Aortic atherosclerosis (HCC)    Arthralgia of hip 03/01/2017   Asthma    Colon polyp    Depressive disorder    Diabetic neuropathy (HCC)    DM2 (diabetes mellitus, type 2) (HCC) 09/13/2016   Encephalopathy 09/13/2016   Fat necrosis of skin 09/13/2016   High cholesterol    History of lumbar fusion 12/06/2017   History of surgical site infection 08/10/2017   Formatting of this note might be different from the original. Added automatically from request for surgery [id number]   History of total left knee replacement 05/12/2018   HTN (hypertension) 09/13/2016   Hyperlipemia    Impaired ambulation 11/14/2017   Last Assessment & Plan:  Formatting of this note might be different from the original. PT eval PT D/C Recs: 24/7 s/PRN a and HHPT. If appropriate assistance not available at home, rec inpt post acute therapy/low intensity prior to D/C home.   Infected orthopedic implant, subsequent encounter 05/12/2018   Lumbar degenerative disc disease 03/01/2017   Lumbar radiculopathy 03/01/2017   Obesity    Osteoarthritis    Respiratory failure, acute (HCC) 08/29/2016   Vertebral osteomyelitis (HCC) 11/10/2017   Last Assessment & Plan:  Formatting of this note might be different from the original. Osteomyelitis at L5  and T12 seen on MRI on 7/25  PLAN  -  OSH cultures growing Staph Ludgunensis, oxacillin susceptible. ABX adjusted ID consulted, appreciate recs  -  Oxacillin and Rifampin until 9/15  Ortho consulted - concerned for acute OM - no surgical intervention - IV abx x 6 weeks   IR consulted  - CT gu   Past Surgical History:  Past Surgical History:  Procedure Laterality Date   ABDOMINAL HYSTERECTOMY     APPENDECTOMY     BACK SURGERY     CARDIAC CATHETERIZATION     Normal in 2002 and 2009   CHOLECYSTECTOMY     KNEE SURGERY Bilateral    SHOULDER SURGERY     SPINE SURGERY     HPI:  Pt is a 82 yo female presenting from SNF 8/15 with R sided facial swelling and trouble swallowing. CT soft tissue neck suggestive of severe parotitis as well as moderate narrowing of the R supraglottic larynx. PMH includes: DM, HTN, HLD, atrial fib, recurrent UTI, and asthma, malignant neoplasm of liver, h/o lumbar fusion    Assessment / Plan / Recommendation  Clinical Impression  Pt denies any baseline oropharyngeal dysphagia but says that since her facial swelling began, she has been experiencing some odynophagia. It has also felt like she has a knot in her throat. She was able to self-feed without any overt signs of dysphagia, although she says that her swallowing subjectively does not feel like it is  improved yet. She seems appropriate to start a PO diet. Will start with mechanical soft foods per her preference for comfort. SLP will f/u briefly, but suspect she will be okay to advance as tolerated as swelling goes down. SLP Visit Diagnosis: Dysphagia, unspecified (R13.10)    Aspiration Risk  Mild aspiration risk    Diet Recommendation Dysphagia 3 (Mech soft);Thin liquid    Liquid Administration via: Cup;Straw Medication Administration: Whole meds with liquid Supervision: Patient able to self feed;Intermittent supervision to cue for compensatory strategies Compensations: Minimize environmental distractions;Slow  rate;Small sips/bites Postural Changes: Seated upright at 90 degrees    Other  Recommendations Oral Care Recommendations: Oral care BID    Recommendations for follow up therapy are one component of a multi-disciplinary discharge planning process, led by the attending physician.  Recommendations may be updated based on patient status, additional functional criteria and insurance authorization.  Follow up Recommendations No SLP follow up      Assistance Recommended at Discharge    Functional Status Assessment Patient has had a recent decline in their functional status and demonstrates the ability to make significant improvements in function in a reasonable and predictable amount of time.  Frequency and Duration min 2x/week  1 week       Prognosis Prognosis for improved oropharyngeal function: Good      Swallow Study   General HPI: Pt is a 82 yo female presenting from SNF 8/15 with R sided facial swelling and trouble swallowing. CT soft tissue neck suggestive of severe parotitis as well as moderate narrowing of the R supraglottic larynx. PMH includes: DM, HTN, HLD, atrial fib, recurrent UTI, and asthma, malignant neoplasm of liver, h/o lumbar fusion Type of Study: Bedside Swallow Evaluation Previous Swallow Assessment: none in chart Diet Prior to this Study: NPO Temperature Spikes Noted: No Respiratory Status: Nasal cannula History of Recent Intubation: No Behavior/Cognition: Alert;Cooperative;Pleasant mood Oral Cavity Assessment: Within Functional Limits Oral Care Completed by SLP: No Oral Cavity - Dentition: Adequate natural dentition Vision: Functional for self-feeding Patient Positioning: Upright in bed Baseline Vocal Quality: Normal Volitional Cough: Weak Volitional Swallow: Able to elicit    Oral/Motor/Sensory Function Overall Oral Motor/Sensory Function: Mild impairment (reduced labial ROM bilaterally)   Ice Chips Ice chips: Not tested   Thin Liquid Thin Liquid: Within  functional limits Presentation: Cup;Self Fed;Straw    Nectar Thick Nectar Thick Liquid: Not tested   Honey Thick Honey Thick Liquid: Not tested   Puree Puree: Within functional limits Presentation: Self Fed;Spoon   Solid     Solid: Within functional limits Presentation: Self Fed      Mahala Menghini., M.A. CCC-SLP Acute Rehabilitation Services Office [phone]  Secure chat preferred  12/03/2022,1:17 PM

## 2022-12-04 DIAGNOSIS — K1121 Acute sialoadenitis: Secondary | ICD-10-CM | POA: Diagnosis not present

## 2022-12-04 LAB — CBC
HCT: 38.7 % (ref 36.0–46.0)
Hemoglobin: 11.8 g/dL — ABNORMAL LOW (ref 12.0–15.0)
MCH: 24.8 pg — ABNORMAL LOW (ref 26.0–34.0)
MCHC: 30.5 g/dL (ref 30.0–36.0)
MCV: 81.3 fL (ref 80.0–100.0)
Platelets: 147 10*3/uL — ABNORMAL LOW (ref 150–400)
RBC: 4.76 MIL/uL (ref 3.87–5.11)
RDW: 16.4 % — ABNORMAL HIGH (ref 11.5–15.5)
WBC: 12.9 10*3/uL — ABNORMAL HIGH (ref 4.0–10.5)
nRBC: 0 % (ref 0.0–0.2)

## 2022-12-04 LAB — GLUCOSE, CAPILLARY
Glucose-Capillary: 209 mg/dL — ABNORMAL HIGH (ref 70–99)
Glucose-Capillary: 192 mg/dL — ABNORMAL HIGH (ref 70–99)
Glucose-Capillary: 241 mg/dL — ABNORMAL HIGH (ref 70–99)
Glucose-Capillary: 220 mg/dL — ABNORMAL HIGH (ref 70–99)
Glucose-Capillary: 258 mg/dL — ABNORMAL HIGH (ref 70–99)

## 2022-12-04 LAB — BASIC METABOLIC PANEL
Anion gap: 13 (ref 5–15)
BUN: 15 mg/dL (ref 8–23)
CO2: 27 mmol/L (ref 22–32)
Calcium: 8.1 mg/dL — ABNORMAL LOW (ref 8.9–10.3)
Chloride: 99 mmol/L (ref 98–111)
Creatinine, Ser: 0.64 mg/dL (ref 0.44–1.00)
GFR, Estimated: >60 mL/min (ref >60)
Glucose, Bld: 204 mg/dL — ABNORMAL HIGH (ref 70–99)
Potassium: 3.4 mmol/L — ABNORMAL LOW (ref 3.5–5.1)
Sodium: 139 mmol/L (ref 135–145)

## 2022-12-04 LAB — BLOOD GAS, ARTERIAL
Acid-Base Excess: 5 mmol/L — ABNORMAL HIGH (ref 0.0–2.0)
Bicarbonate: 29.9 mmol/L — ABNORMAL HIGH (ref 20.0–28.0)
O2 Saturation: 97 %
Patient temperature: 36.4
pCO2 arterial: 43 mmHg (ref 32–48)
pH, Arterial: 7.45 (ref 7.35–7.45)
pO2, Arterial: 76 mmHg — ABNORMAL LOW (ref 83–108)

## 2022-12-04 MED ORDER — INSULIN ASPART 100 UNIT/ML IJ SOLN
0.0000 [IU] | Freq: Three times a day (TID) | INTRAMUSCULAR | Status: DC
  Administered 2022-12-04: 8 [IU] via SUBCUTANEOUS
  Administered 2022-12-04: 5 [IU] via SUBCUTANEOUS
  Administered 2022-12-05: 3 [IU] via SUBCUTANEOUS
  Administered 2022-12-05 (×2): 2 [IU] via SUBCUTANEOUS
  Administered 2022-12-06: 3 [IU] via SUBCUTANEOUS
  Administered 2022-12-07 (×2): 2 [IU] via SUBCUTANEOUS

## 2022-12-04 MED ORDER — ENOXAPARIN SODIUM 40 MG/0.4ML IJ SOSY
40.0000 mg | PREFILLED_SYRINGE | INTRAMUSCULAR | Status: DC
  Administered 2022-12-04 – 2022-12-05 (×2): 40 mg via SUBCUTANEOUS
  Filled 2022-12-04 (×2): qty 0.4

## 2022-12-04 MED ORDER — AMLODIPINE BESYLATE 10 MG PO TABS
10.0000 mg | ORAL_TABLET | Freq: Every day | ORAL | Status: DC
  Administered 2022-12-04 – 2022-12-07 (×4): 10 mg via ORAL
  Filled 2022-12-04 (×4): qty 1

## 2022-12-04 MED ORDER — INSULIN ASPART 100 UNIT/ML IJ SOLN: 0.0000 [IU] | INTRAMUSCULAR | Status: DC

## 2022-12-04 MED ORDER — FLUOXETINE HCL 20 MG PO CAPS
40.0000 mg | ORAL_CAPSULE | Freq: Every day | ORAL | Status: DC
  Administered 2022-12-04 – 2022-12-07 (×4): 40 mg via ORAL
  Filled 2022-12-04 (×4): qty 2

## 2022-12-04 MED ORDER — PANTOPRAZOLE SODIUM 40 MG PO TBEC
40.0000 mg | DELAYED_RELEASE_TABLET | Freq: Every day | ORAL | Status: DC
  Administered 2022-12-04 – 2022-12-07 (×4): 40 mg via ORAL
  Filled 2022-12-04 (×4): qty 1

## 2022-12-04 MED ORDER — POTASSIUM CHLORIDE CRYS ER 20 MEQ PO TBCR
40.0000 meq | EXTENDED_RELEASE_TABLET | Freq: Two times a day (BID) | ORAL | Status: AC
  Administered 2022-12-04 (×2): 40 meq via ORAL
  Filled 2022-12-04 (×2): qty 2

## 2022-12-04 NOTE — Progress Notes (Signed)
Progress Note   Patient: Jeanette [name]:[id number] DOB: May 18, 1940 DOA: 12/02/2022     2 DOS: the patient was seen and examined on 12/04/2022   Subjective:  Patient seen and examined at bedside this morning She admits to improvement right parotid area swelling Redness is much improved She has also been seen by ENT who recommends continuation of current antibiotics She denies nausea vomiting abdominal pain chest pain or cough Patient is here to get out of bed   Brief hospital course: From HPI "Jeanette [name] is a 82 y.o. female with medical history significant of malignant neoplasm of the liver, Afib on Eliquis, asthma, type 2 diabetes, hyperlipidemia, hypertension, history of lumbar fusion and vertebral osteomyelitis in 2019 presented to the ED via EMS from [facility] for evaluation of swelling and redness to the right side of her face which began 2 months ago. Afebrile.  Labs showing WBC 23.1, T. bili 1.7 and remainder of LFTs normal, magnesium 1.3, potassium 3.8.  Chest x-ray showing slight left lung base atelectasis.  CT soft tissue neck showing findings suggestive of severe parotiditis and no drainable fluid collection.  Also showing asymmetrically edematous/thickened appearance of the right supraglottic larynx which is moderately narrowed, possibly infectious and secondary to the right parotid process, malignancy not excluded.  CT also showing an approximately 1.5 cm left thyroid nodule for which ultrasound is recommended for further evaluation.  Critical care evaluated the patient and felt that she was stable for the floor versus progressive care for airway monitoring and IV steroids/antibiotics. EDP discussed the case with the ENT, will consult in the morning.  Patient was given vancomycin, Unasyn, prednisone 40 mg, and IV magnesium 2 g. TRH called to admit.   Patient reports right-sided facial swelling and pain which she thinks started "2 days ago."  Denies any difficulty swallowing or  pain with swallowing.  Denies any difficulty breathing.  Denies chest pain.  She has no other complaints and is not able to give any additional history.  "     Assessment and Plan: Severe parotitis Supraglottic airway narrowing On presentation patient had WBC 23.1, no fever or signs of sepsis.   Patient has no wheezing or stridor  Currently maintaining appropriate saturation on room air  Continue management in progressive unit for closer monitoring  Right parotid area swelling improved Patient has been seen by ENT surgeon Leukocytosis improved ENT surgeon has recommended steroid therapy in addition to antibiotics Continue current antibiotics Continue dexamethasone Plan of care discussed with ENT surgery and no surgery is planned     Incidental thyroid nodule TSH within normal limits Free T4 slightly elevated Outpatient endocrine follow-up   Hypomagnesemia QT prolongation Continue monitoring   Malignant neoplasm of the liver Per PCP note from 06/08/2022, patient had decided not to pursue further workup or treatment for this problem.  She was referred to palliative care. Follow-up with palliative care   Paroxysmal Afib Currently in sinus rhythm.   Continue metoprolol and Eliquis after pharmacy med rec is done.   Type 2 diabetes A1c 5.9 Based on A1c result patient does not have diabetes   Asthma:  Stable, no signs of acute exacerbation As needed nebulization .  Hypertension: Continue current home medication  Pharmacy med rec pending.   DVT prophylaxis: Continue Eliquis after pharmacy med rec is done. Code Status: Full Code (discussed with the patient) Family Communication: No family available at this time. Consults called: PCCM, ENT  Level of care: Progressive Care Unit  Physical Exam: HENT: Swelling in the right parotid area improving, erythema improving    Extraocular Movements: Extraocular movements intact.  Cardiovascular:     Rate and Rhythm: Normal rate  and regular rhythm.     Pulses: Normal pulses.  Pulmonary: No acute respiratory distress Abdominal:     General: Bowel sounds are normal. There is no distension.     Palpations: Abdomen is soft.     Tenderness: There is no abdominal tenderness.  Musculoskeletal:     Cervical back: Normal range of motion.     Right lower leg: No edema.     Left lower leg: No edema.  Skin:    General: Skin is warm and dry.  Neurological:     General: No focal deficit present.     Mental Status: She is alert and oriented to person, place, and time.        Data Reviewed: I have reviewed patient's lab results including CBC, BMP as shown below as well as ENT surgeon documentation      Latest Ref Rng & Units 12/04/2022    3:26 AM 12/03/2022   10:01 AM 12/02/2022    2:02 PM  CBC  WBC 4.0 - 10.5 K/uL 12.9  19.4    Hemoglobin 12.0 - 15.0 g/dL 13.0  86.5  78.4   Hematocrit 36.0 - 46.0 % 38.7  39.3  41.0   Platelets 150 - 400 K/uL 147  160         Latest Ref Rng & Units 12/04/2022    3:26 AM 12/03/2022   10:01 AM 12/02/2022    2:02 PM  BMP  Glucose 70 - 99 mg/dL 696  295  284   BUN 8 - 23 mg/dL 15  14  15    Creatinine 0.44 - 1.00 mg/dL 1.32  4.40  1.02   Sodium 135 - 145 mmol/L 139  134  134   Potassium 3.5 - 5.1 mmol/L 3.4  3.2  3.9   Chloride 98 - 111 mmol/L 99  98  98   CO2 22 - 32 mmol/L 27  24    Calcium 8.9 - 10.3 mg/dL 8.1  7.6      Vitals:   12/04/22 0300 12/04/22 0700 12/04/22 0733 12/04/22 1056  BP: (!) 152/56  (!) 148/55 (!) 148/61  Pulse: 71 66 63 69  Resp: 18  13 15   Temp: 97.6 F (36.4 C) (!) 97.4 F (36.3 C) (!) 97.4 F (36.3 C) (!) 97.5 F (36.4 C)  TempSrc: Axillary Oral Oral Oral  SpO2: 97%  96% 94%  Weight:      Height:         Author: Loyce Dys, MD 12/04/2022 3:48 PM  For on call review [email or web address].

## 2022-12-04 NOTE — Progress Notes (Signed)
Subjective: Patient with acute right parotitis on systemic antibiotics.  Called last night due to hypoxia.  Deferred to medical team as the parotid infection is not a direct cause.  She reports this morning feeling somewhat better regarding right cheek pain and swelling.  Objective: Vital signs in last 24 hours: Temp:  [97.4 F (36.3 C)-98.2 F (36.8 C)] 97.5 F (36.4 C) (08/17 1056) Pulse Rate:  [63-91] 69 (08/17 1056) Resp:  [12-18] 15 (08/17 1056) BP: (130-152)/(49-87) 148/61 (08/17 1056) SpO2:  [92 %-97 %] 94 % (08/17 1056) Wt Readings from Last 1 Encounters:  12/02/22 84.6 kg    Intake/Output from previous day: 08/16 0701 - 08/17 0700 In: 570.4 [I.V.:70.4; IV Piggyback:500] Out: 200 [Urine:200] Intake/Output this shift: Total I/O In: 480 [P.O.:480] Out: -   General appearance: alert, cooperative, and no distress Head: right parotid firm edema and tenderness, unable to push any fluid from gland  Recent Labs    12/03/22 1001 12/04/22 0326  WBC 19.4* 12.9*  HGB 12.1 11.8*  HCT 39.3 38.7  PLT 160 147*    Recent Labs    12/03/22 1001 12/04/22 0326  NA 134* 139  K 3.2* 3.4*  CL 98 99  CO2 24 27  GLUCOSE 143* 204*  BUN 14 15  CREATININE 0.70 0.64  CALCIUM 7.6* 8.1*    Medications: I have reviewed the patient's current medications.  Assessment/Plan: Right acute parotitis  Some subjective improvement.  Continue IV Unasyn, frequent application of heat, hydration, sialogogues, and massage of the gland to encourage flow.  No surgical indication.   LOS: 2 days   Christia Reading 12/04/2022, 11:06 AM

## 2022-12-05 DIAGNOSIS — E119 Type 2 diabetes mellitus without complications: Secondary | ICD-10-CM | POA: Diagnosis not present

## 2022-12-05 DIAGNOSIS — I10 Essential (primary) hypertension: Secondary | ICD-10-CM

## 2022-12-05 DIAGNOSIS — J45909 Unspecified asthma, uncomplicated: Secondary | ICD-10-CM

## 2022-12-05 DIAGNOSIS — C229 Malignant neoplasm of liver, not specified as primary or secondary: Secondary | ICD-10-CM

## 2022-12-05 DIAGNOSIS — E041 Nontoxic single thyroid nodule: Secondary | ICD-10-CM

## 2022-12-05 DIAGNOSIS — R9431 Abnormal electrocardiogram [ECG] [EKG]: Secondary | ICD-10-CM

## 2022-12-05 DIAGNOSIS — K1121 Acute sialoadenitis: Secondary | ICD-10-CM | POA: Diagnosis not present

## 2022-12-05 LAB — GLUCOSE, CAPILLARY
Glucose-Capillary: 154 mg/dL — ABNORMAL HIGH (ref 70–99)
Glucose-Capillary: 115 mg/dL — ABNORMAL HIGH (ref 70–99)
Glucose-Capillary: 134 mg/dL — ABNORMAL HIGH (ref 70–99)
Glucose-Capillary: 124 mg/dL — ABNORMAL HIGH (ref 70–99)

## 2022-12-05 LAB — CBC
HCT: 35.3 % — ABNORMAL LOW (ref 36.0–46.0)
Hemoglobin: 10.9 g/dL — ABNORMAL LOW (ref 12.0–15.0)
MCH: 25.2 pg — ABNORMAL LOW (ref 26.0–34.0)
MCHC: 30.9 g/dL (ref 30.0–36.0)
MCV: 81.7 fL (ref 80.0–100.0)
Platelets: 145 10*3/uL — ABNORMAL LOW (ref 150–400)
RBC: 4.32 MIL/uL (ref 3.87–5.11)
RDW: 16.1 % — ABNORMAL HIGH (ref 11.5–15.5)
WBC: 14.6 10*3/uL — ABNORMAL HIGH (ref 4.0–10.5)
nRBC: 0 % (ref 0.0–0.2)

## 2022-12-05 LAB — BASIC METABOLIC PANEL
Anion gap: 8 (ref 5–15)
BUN: 13 mg/dL (ref 8–23)
CO2: 26 mmol/L (ref 22–32)
Calcium: 7.9 mg/dL — ABNORMAL LOW (ref 8.9–10.3)
Chloride: 102 mmol/L (ref 98–111)
Creatinine, Ser: 0.62 mg/dL (ref 0.44–1.00)
GFR, Estimated: >60 mL/min (ref >60)
Glucose, Bld: 132 mg/dL — ABNORMAL HIGH (ref 70–99)
Potassium: 4.5 mmol/L (ref 3.5–5.1)
Sodium: 136 mmol/L (ref 135–145)

## 2022-12-05 LAB — MRSA NEXT GEN BY PCR, NASAL: MRSA by PCR Next Gen: DETECTED — AB

## 2022-12-05 MED ORDER — CHLORHEXIDINE GLUCONATE CLOTH 2 % EX PADS: 6.0000 | MEDICATED_PAD | Freq: Every day | CUTANEOUS | Status: DC

## 2022-12-05 MED ORDER — APIXABAN 5 MG PO TABS
5.0000 mg | ORAL_TABLET | Freq: Two times a day (BID) | ORAL | Status: DC
  Administered 2022-12-05 – 2022-12-07 (×4): 5 mg via ORAL
  Filled 2022-12-05 (×4): qty 1

## 2022-12-05 MED ORDER — MUPIROCIN 2 % EX OINT
1.0000 | TOPICAL_OINTMENT | Freq: Two times a day (BID) | CUTANEOUS | Status: DC
  Administered 2022-12-05 – 2022-12-07 (×4): 1 via NASAL
  Filled 2022-12-05: qty 22

## 2022-12-05 MED ORDER — METOPROLOL SUCCINATE ER 25 MG PO TB24
25.0000 mg | ORAL_TABLET | Freq: Every day | ORAL | Status: DC
  Administered 2022-12-05 – 2022-12-07 (×3): 25 mg via ORAL
  Filled 2022-12-05 (×3): qty 1

## 2022-12-05 NOTE — Hospital Course (Addendum)
The patient is a 82 year old obese Caucasian female with a past medical history significant for but limited to malignant neoplasm of the liver, history of atrial fibrillation on anticoagulant Eliquis, asthma, diabetes mellitus type 2, hyperlipidemia, hypertension, history of lumbar fusion and vertebral osteomyelitis in 2019 as well as other comorbidities who presented to the ED from her SNF via EMS for evaluation of swelling and redness on the right side of her face which began approximately 2 months ago.  Had subsequently started worsening and upon arrival her labs showed a WBC of 23.1 and a T. bili of 1.7 with normal LFTs.  Chest x-ray done and showed slight left lung base atelectasis and a CT soft tissue neck showed findings suggestive of severe proctitis with no drainable fluid collection as well as also showing asymmetrically edematous and thickened appearance of the right supraglottic larynx which was moderately narrowed which was possibly infectious and secondary to the right parotid process however malignancy was not excluded.  The CT also incidentally showed a 1.5 cm left thyroid nodule which an ultrasound was obtained.  CCM evaluated and she was stable for the floor versus progressive care and she was initiated on IV antibiotics and steroids.  Steroids have now completed.  She is placed on IV antibiotics with vancomycin and Unasyn and we will discontinue the vancomycin if her MRSA PCR is negative.  She is slowly improving and ENT continue to monitor and recommending sialagogues and current antibiotics.  There is no surgical indication at this time so we will resume her anticoagulation with apixaban.  Of note the nurse has stated that she does have some nocturnal desaturations and may need some nocturnal oxygen and evaluation by pulmonary in outpatient setting.  Assessment and Plan:  Acute Right Severe Parotitis Supraglottic airway narrowing -On presentation patient had WBC 23.1, no fever or signs of  sepsis.   -Patient has no wheezing or stridor  -CT Soft Tissue Neck done and showed " Enlarged/edematous right parotid gland with surrounding edema, suggestive of severe parotiditis. Nodularity within the right parotid likely represents inflamed lymph nodes. No drainable fluid collection. Asymmetrically edematous/thickened appearance of the right supraglottic larynx which is moderately narrowed, probably infectious and secondary to the right parotid process. Correlation with direct visualization is recommended to exclude malignancy." -Currently maintaining appropriate saturation on room air and no longer on O2 but was hypoxic last night and per nursing has been having Nocturnal Desaturations so will order an Overnight Oximetry Test.  SpO2: 90 % O2 Flow Rate (L/min): 5 L/min  -Continue management in progressive unit for closer monitoring  -Right parotid area swelling improing  -Patient has been seen by ENT surgeon and they are recommending continuing IV Unasyn, frequent application of heat, hydration, sialagogues and massaging the gland to encourage flow with no surgical indication at present -Leukocytosis improved initially but now worsened in the setting of Steroids (she received a dose of prednisone 40 mg and then 8 mg of 3 times daily for 1 day and is no longer on Dexamethasone now) Recent Labs  Lab 12/02/22 1303 12/03/22 1001 12/04/22 0326 12/05/22 0132  WBC 23.1* 19.4* 12.9* 14.6*  -ENT surgeon has recommended steroid therapy in addition to antibiotics -Continue current antibiotics and if MRSA Negative can Stop Vancomycin; (Prescribed on Linezolid) -No longer on Dexamethasone   Incidental Thyroid Nodules -TSH within normal limits at 1.268 and Free T4 slightly elevated at 1.63 -Noted to be 1.5 cm Left Thyroid nodule on CT Scan -U/S Thyroid done and showed "Nodules  2 and 3 meet criteria for imaging follow-up. Annual ultrasound surveillance is recommended until 5 years of stability is  documented. -She will need outpatient Endocrine follow-up   Hypomagnesemia -Patient's Mag Level Trend: Recent Labs  Lab 12/02/22 1303 12/03/22 1001  MG 1.3* 2.1  -Continue to Monitor and Replete as Necessary -Repeat Mag in the AM   QT Prolongation -Continue to Monitor Telemetry -Replete Electrolytes -Avoid QT Prolonging Medications   Malignant Neoplasm of the Liver -Per PCP note from 06/08/2022, patient had decided not to pursue further workup or treatment for this problem.   -She was referred to palliative care. -Follow-up with palliative care   Paroxysmal A Fib -Currently in sinus rhythm.   -Resuime Metoprolol Succinate 25 mg po Daily and Apixaban 5 mg po BID (No surgical indication per ENT so will resume at this time) -Continue to Monitor on Telmetry    Hx of Type 2 Diabetes; Now Pre-Diabetic  -HbA1c was 5.9 -Hold Home Metformin 1000 mg po BID -Based on this A1c result patient does not have diabetes but is Prediabetic and cannot see any other results in our system -Continuing Moderate Novolog SSI AC/HS -Continue to Monitor CBG and Glucose Trend: Recent Labs  Lab 12/04/22 0736 12/04/22 1101 12/04/22 1551 12/04/22 2034 12/05/22 0726 12/05/22 1205 12/05/22 1618  GLUCAP 192* 241* 220* 258* 154* 115* 134*   Recent Labs  Lab 12/02/22 1303 12/02/22 1402 12/03/22 1001 12/04/22 0326 12/05/22 0132  GLUCOSE 126* 128* 143* 204* 132*   Asthma -Stable, no signs of acute exacerbation -As needed nebulization -Repeat CXR in the AM    Hypertension -Continue current home medications with Amlodipine 10 mg po Daily and Resume Home Metoprolol Succinate 25 mg po Daily  -Continue to Monitor BP per Protocol -Last BP reading was 139/56  Hyperbilirubinemia -Bilirubin Trend: Recent Labs  Lab 12/02/22 1303  BILITOT 1.7*  -Continue to Monitor and Trend and repeat CMP in the AM   Normocytic Anemia -Hgb/Hct Trend: Recent Labs  Lab 12/02/22 1303 12/02/22 1402  12/03/22 1001 12/04/22 0326 12/05/22 0132  HGB 12.6 13.9 12.1 11.8* 10.9*  HCT 41.3 41.0 39.3 38.7 35.3*  MCV 82.6  --  81.9 81.3 81.7  -Check Anemia Panel in the AM -Continue to Monitor for S/S of Bleeding; No overt bleeding noted -Repeat CBC in the AM   Thrombocytopenia -Platelet Count Trend: Recent Labs  Lab 12/02/22 1303 12/03/22 1001 12/04/22 0326 12/05/22 0132  PLT 192 160 147* 145*  -Continue to Monitor and Trend Platelet Count Trend and repeat CBC in the AM   GERD/GI Prophylaxis -C/w Pantoprazole 40 mg po Daily   Obesity -Complicates overall prognosis and care -Estimated body mass index is 31.04 kg/m as calculated from the following:   Height as of this encounter: 5\' 5"  (1.651 m).   Weight as of this encounter: 84.6 kg.  -Weight Loss and Dietary Counseling given

## 2022-12-05 NOTE — Progress Notes (Signed)
PROGRESS NOTE    Jeanette [name]  XBJ:[id number] DOB: Sep 04, 1940 DOA: 12/02/2022 PCP: Crist Fat, MD   Brief Narrative:  The patient is a 82 year old obese Caucasian female with a past medical history significant for but limited to malignant neoplasm of the liver, history of atrial fibrillation on anticoagulant Eliquis, asthma, diabetes mellitus type 2, hyperlipidemia, hypertension, history of lumbar fusion and vertebral osteomyelitis in 2019 as well as other comorbidities who presented to the ED from her SNF via EMS for evaluation of swelling and redness on the right side of her face which began approximately 2 months ago.  Had subsequently started worsening and upon arrival her labs showed a WBC of 23.1 and a T. bili of 1.7 with normal LFTs.  Chest x-ray done and showed slight left lung base atelectasis and a CT soft tissue neck showed findings suggestive of severe proctitis with no drainable fluid collection as well as also showing asymmetrically edematous and thickened appearance of the right supraglottic larynx which was moderately narrowed which was possibly infectious and secondary to the right parotid process however malignancy was not excluded.  The CT also incidentally showed a 1.5 cm left thyroid nodule which an ultrasound was obtained.  CCM evaluated and she was stable for the floor versus progressive care and she was initiated on IV antibiotics and steroids.  Steroids have now completed.  She is placed on IV antibiotics with vancomycin and Unasyn and we will discontinue the vancomycin if her MRSA PCR is negative.  She is slowly improving and ENT continue to monitor and recommending sialagogues and current antibiotics.  There is no surgical indication at this time so we will resume her anticoagulation with apixaban.  Of note the nurse has stated that she does have some nocturnal desaturations and may need some nocturnal oxygen and evaluation by pulmonary in outpatient setting.  Assessment  and Plan:  Acute Right Severe Parotitis Supraglottic airway narrowing -On presentation patient had WBC 23.1, no fever or signs of sepsis.   -Patient has no wheezing or stridor  -CT Soft Tissue Neck done and showed " Enlarged/edematous right parotid gland with surrounding edema, suggestive of severe parotiditis. Nodularity within the right parotid likely represents inflamed lymph nodes. No drainable fluid collection. Asymmetrically edematous/thickened appearance of the right supraglottic larynx which is moderately narrowed, probably infectious and secondary to the right parotid process. Correlation with direct visualization is recommended to exclude malignancy." -Currently maintaining appropriate saturation on room air and no longer on O2 but was hypoxic last night and per nursing has been having Nocturnal Desaturations so will order an Overnight Oximetry Test.  SpO2: 90 % O2 Flow Rate (L/min): 5 L/min  -Continue management in progressive unit for closer monitoring  -Right parotid area swelling improing  -Patient has been seen by ENT surgeon and they are recommending continuing IV Unasyn, frequent application of heat, hydration, sialagogues and massaging the gland to encourage flow with no surgical indication at present -Leukocytosis improved initially but now worsened in the setting of Steroids (she received a dose of prednisone 40 mg and then 8 mg of 3 times daily for 1 day and is no longer on Dexamethasone now) Recent Labs  Lab 12/02/22 1303 12/03/22 1001 12/04/22 0326 12/05/22 0132  WBC 23.1* 19.4* 12.9* 14.6*  -ENT surgeon has recommended steroid therapy in addition to antibiotics -Continue current antibiotics and if MRSA Negative can Stop Vancomycin; (Prescribed on Linezolid) -No longer on Dexamethasone   Incidental Thyroid Nodules -TSH within normal limits at 1.268  and Free T4 slightly elevated at 1.63 -Noted to be 1.5 cm Left Thyroid nodule on CT Scan -U/S Thyroid done and showed  "Nodules 2 and 3 meet criteria for imaging follow-up. Annual ultrasound surveillance is recommended until 5 years of stability is documented. -She will need outpatient Endocrine follow-up   Hypomagnesemia -Patient's Mag Level Trend: Recent Labs  Lab 12/02/22 1303 12/03/22 1001  MG 1.3* 2.1  -Continue to Monitor and Replete as Necessary -Repeat Mag in the AM   QT Prolongation -Continue to Monitor Telemetry -Replete Electrolytes -Avoid QT Prolonging Medications   Malignant Neoplasm of the Liver -Per PCP note from 06/08/2022, patient had decided not to pursue further workup or treatment for this problem.   -She was referred to palliative care. -Follow-up with palliative care   Paroxysmal A Fib -Currently in sinus rhythm.   -Resuime Metoprolol Succinate 25 mg po Daily and Apixaban 5 mg po BID (No surgical indication per ENT so will resume at this time) -Continue to Monitor on Telmetry    Hx of Type 2 Diabetes; Now Pre-Diabetic  -HbA1c was 5.9 -Hold Home Metformin 1000 mg po BID -Based on this A1c result patient does not have diabetes but is Prediabetic and cannot see any other results in our system -Continuing Moderate Novolog SSI AC/HS -Continue to Monitor CBG and Glucose Trend: Recent Labs  Lab 12/04/22 0736 12/04/22 1101 12/04/22 1551 12/04/22 2034 12/05/22 0726 12/05/22 1205 12/05/22 1618  GLUCAP 192* 241* 220* 258* 154* 115* 134*   Recent Labs  Lab 12/02/22 1303 12/02/22 1402 12/03/22 1001 12/04/22 0326 12/05/22 0132  GLUCOSE 126* 128* 143* 204* 132*   Asthma -Stable, no signs of acute exacerbation -As needed nebulization -Repeat CXR in the AM    Hypertension -Continue current home medications with Amlodipine 10 mg po Daily and Resume Home Metoprolol Succinate 25 mg po Daily  -Continue to Monitor BP per Protocol -Last BP reading was 139/56  Hyperbilirubinemia -Bilirubin Trend: Recent Labs  Lab 12/02/22 1303  BILITOT 1.7*  -Continue to Monitor and  Trend and repeat CMP in the AM   Normocytic Anemia -Hgb/Hct Trend: Recent Labs  Lab 12/02/22 1303 12/02/22 1402 12/03/22 1001 12/04/22 0326 12/05/22 0132  HGB 12.6 13.9 12.1 11.8* 10.9*  HCT 41.3 41.0 39.3 38.7 35.3*  MCV 82.6  --  81.9 81.3 81.7  -Check Anemia Panel in the AM -Continue to Monitor for S/S of Bleeding; No overt bleeding noted -Repeat CBC in the AM   Thrombocytopenia -Platelet Count Trend: Recent Labs  Lab 12/02/22 1303 12/03/22 1001 12/04/22 0326 12/05/22 0132  PLT 192 160 147* 145*  -Continue to Monitor and Trend Platelet Count Trend and repeat CBC in the AM   GERD/GI Prophylaxis -C/w Pantoprazole 40 mg po Daily   Obesity -Complicates overall prognosis and care -Estimated body mass index is 31.04 kg/m as calculated from the following:   Height as of this encounter: 5\' 5"  (1.651 m).   Weight as of this encounter: 84.6 kg.  -Weight Loss and Dietary Counseling given   DVT prophylaxis:  apixaban (ELIQUIS) tablet 5 mg    Code Status: Full Code Family Communication: No family present at bedside   Disposition Plan:  Level of care: Progressive Status is: Inpatient Remains inpatient appropriate because: Further clinical improvement anticipate discharge in next 48 hours   Consultants:  ENT  Procedures:  As delineated as above  Antimicrobials:  Anti-infectives (From admission, onward)    Start     Dose/Rate Route Frequency Ordered Stop  12/03/22 2300  vancomycin (VANCOCIN) IVPB 1000 mg/200 mL premix        1,000 mg 200 mL/hr over 60 Minutes Intravenous Every 24 hours 12/02/22 2117     12/03/22 0100  Ampicillin-Sulbactam (UNASYN) 3 g in sodium chloride 0.9 % 100 mL IVPB        3 g 200 mL/hr over 30 Minutes Intravenous Every 6 hours 12/02/22 2117     12/02/22 2045  vancomycin (VANCOREADY) IVPB 1750 mg/350 mL        1,750 mg 175 mL/hr over 120 Minutes Intravenous  Once 12/02/22 2039 12/02/22 2348   12/02/22 1845  Ampicillin-Sulbactam (UNASYN) 3  g in sodium chloride 0.9 % 100 mL IVPB        3 g 200 mL/hr over 30 Minutes Intravenous  Once 12/02/22 1844 12/02/22 2048       Subjective: Seen and examined at bedside and thinks he is doing better.  Still has some facial pain but states is not as bad.  No nausea or vomiting.  Denies other concerns or complaints this time.  Objective: Vitals:   12/05/22 0848 12/05/22 0856 12/05/22 1207 12/05/22 1500  BP: (!) 156/56  (!) 127/59 (!) 139/56  Pulse:  65 63 66  Resp:  16 16 15   Temp:   97.7 F (36.5 C) 98 F (36.7 C)  TempSrc:   Oral Oral  SpO2:  91% 93% 90%  Weight:      Height:        Intake/Output Summary (Last 24 hours) at 12/05/2022 1754 Last data filed at 12/05/2022 1500 Gross per 24 hour  Intake 540 ml  Output 1425 ml  Net -885 ml   Filed Weights   12/02/22 1243  Weight: 84.6 kg   Examination: Physical Exam:  Constitutional: WN/WD obese elderly Caucasian female with right parotid swelling and some erythema Respiratory: Diminished to auscultation bilaterally, no wheezing, rales, rhonchi or crackles. Normal respiratory effort and patient is not tachypenic. No accessory muscle use.  Unlabored breathing Cardiovascular: RRR, no murmurs / rubs / gallops. S1 and S2 auscultated. No extremity edema. Abdomen: Soft, non-tender, distended secondary to body habitus.  Bowel sounds positive.  GU: Deferred. Musculoskeletal: No clubbing / cyanosis of digits/nails. No joint deformity upper and lower extremities.  Skin: No rashes, lesions, ulcers limited skin evaluation. No induration; Warm and dry.  Neurologic: CN 2-12 grossly intact with no focal deficits.  Romberg sign and cerebellar reflexes not assessed.  Psychiatric: Normal judgment and insight. Alert and oriented x 3. Normal mood and appropriate affect.   Data Reviewed: I have personally reviewed following labs and imaging studies  CBC: Recent Labs  Lab 12/02/22 1303 12/02/22 1402 12/03/22 1001 12/04/22 0326  12/05/22 0132  WBC 23.1*  --  19.4* 12.9* 14.6*  NEUTROABS 19.7*  --   --   --   --   HGB 12.6 13.9 12.1 11.8* 10.9*  HCT 41.3 41.0 39.3 38.7 35.3*  MCV 82.6  --  81.9 81.3 81.7  PLT 192  --  160 147* 145*   Basic Metabolic Panel: Recent Labs  Lab 12/02/22 1303 12/02/22 1402 12/03/22 1001 12/04/22 0326 12/05/22 0132  NA 135 134* 134* 139 136  K 3.8 3.9 3.2* 3.4* 4.5  CL 95* 98 98 99 102  CO2 24  --  24 27 26   GLUCOSE 126* 128* 143* 204* 132*  BUN 12 15 14 15 13   CREATININE 0.74 0.60 0.70 0.64 0.62  CALCIUM 8.0*  --  7.6*  8.1* 7.9*  MG 1.3*  --  2.1  --   --    GFR: Estimated Creatinine Clearance: 61.2 mL/min (by C-G formula based on SCr of 0.62 mg/dL). Liver Function Tests: Recent Labs  Lab 12/02/22 1303  AST 23  ALT 18  ALKPHOS 66  BILITOT 1.7*  PROT 5.9*  ALBUMIN 2.5*   No results for input(s): "LIPASE", "AMYLASE" in the last 168 hours. No results for input(s): "AMMONIA" in the last 168 hours. Coagulation Profile: No results for input(s): "INR", "PROTIME" in the last 168 hours. Cardiac Enzymes: No results for input(s): "CKTOTAL", "CKMB", "CKMBINDEX", "TROPONINI" in the last 168 hours. BNP (last 3 results) No results for input(s): "PROBNP" in the last 8760 hours. HbA1C: Recent Labs    12/02/22 2347  HGBA1C 5.9*   CBG: Recent Labs  Lab 12/04/22 1551 12/04/22 2034 12/05/22 0726 12/05/22 1205 12/05/22 1618  GLUCAP 220* 258* 154* 115* 134*   Lipid Profile: No results for input(s): "CHOL", "HDL", "LDLCALC", "TRIG", "CHOLHDL", "LDLDIRECT" in the last 72 hours. Thyroid Function Tests: Recent Labs    12/02/22 2347  TSH 1.268  FREET4 1.63*   Anemia Panel: No results for input(s): "VITAMINB12", "FOLATE", "FERRITIN", "TIBC", "IRON", "RETICCTPCT" in the last 72 hours. Sepsis Labs: No results for input(s): "PROCALCITON", "LATICACIDVEN" in the last 168 hours.  No results found for this or any previous visit (from the past 240 hour(s)).   Radiology  Studies: No results found.  Scheduled Meds:  amLODipine  10 mg Oral Daily   apixaban  5 mg Oral BID   FLUoxetine  40 mg Oral Daily   insulin aspart  0-15 Units Subcutaneous TID AC & HS   metoprolol succinate  25 mg Oral Daily   pantoprazole  40 mg Oral Daily   Continuous Infusions:  sodium chloride 10 mL/hr at 12/04/22 0413   ampicillin-sulbactam (UNASYN) IV 3 g (12/05/22 1215)   vancomycin 1,000 mg (12/04/22 2321)    LOS: 3 days   Marguerita Merles, DO [facility] Available via Epic secure chat 7am-7pm After these hours, please refer to coverage provider listed on [email or web address] 12/05/2022, 5:54 PM

## 2022-12-05 NOTE — Progress Notes (Signed)
No procedure anticipated. D/w Dr. Marland Mcalpine and we will resume her apixaban and Toprol.   Ulyses Southward, PharmD, BCIDP, AAHIVP, CPP Infectious Disease Pharmacist 12/05/2022 5:33 PM

## 2022-12-06 DIAGNOSIS — K1121 Acute sialoadenitis: Secondary | ICD-10-CM | POA: Diagnosis not present

## 2022-12-06 LAB — RETICULOCYTES
Immature Retic Fract: 11.7 % (ref 2.3–15.9)
RBC.: 4.54 MIL/uL (ref 3.87–5.11)
Retic Count, Absolute: 53.1 10*3/uL (ref 19.0–186.0)
Retic Ct Pct: 1.2 % (ref 0.4–3.1)

## 2022-12-06 LAB — GLUCOSE, CAPILLARY
Glucose-Capillary: 112 mg/dL — ABNORMAL HIGH (ref 70–99)
Glucose-Capillary: 170 mg/dL — ABNORMAL HIGH (ref 70–99)
Glucose-Capillary: 68 mg/dL — ABNORMAL LOW (ref 70–99)
Glucose-Capillary: 80 mg/dL (ref 70–99)
Glucose-Capillary: 122 mg/dL — ABNORMAL HIGH (ref 70–99)

## 2022-12-06 LAB — CBC WITH DIFFERENTIAL/PLATELET
Abs Immature Granulocytes: 0.06 10*3/uL (ref 0.00–0.07)
Basophils Absolute: 0 10*3/uL (ref 0.0–0.1)
Basophils Relative: 0 %
Eosinophils Absolute: 0.3 10*3/uL (ref 0.0–0.5)
Eosinophils Relative: 2 %
HCT: 37.4 % (ref 36.0–46.0)
Hemoglobin: 11.5 g/dL — ABNORMAL LOW (ref 12.0–15.0)
Immature Granulocytes: 1 %
Lymphocytes Relative: 13 %
Lymphs Abs: 1.7 10*3/uL (ref 0.7–4.0)
MCH: 25.8 pg — ABNORMAL LOW (ref 26.0–34.0)
MCHC: 30.7 g/dL (ref 30.0–36.0)
MCV: 84 fL (ref 80.0–100.0)
Monocytes Absolute: 1 10*3/uL (ref 0.1–1.0)
Monocytes Relative: 8 %
Neutro Abs: 10.2 10*3/uL — ABNORMAL HIGH (ref 1.7–7.7)
Neutrophils Relative %: 76 %
Platelets: 151 10*3/uL (ref 150–400)
RBC: 4.45 MIL/uL (ref 3.87–5.11)
RDW: 16 % — ABNORMAL HIGH (ref 11.5–15.5)
WBC: 13.3 10*3/uL — ABNORMAL HIGH (ref 4.0–10.5)
nRBC: 0 % (ref 0.0–0.2)

## 2022-12-06 LAB — COMPREHENSIVE METABOLIC PANEL
ALT: 16 U/L (ref 0–44)
AST: 17 U/L (ref 15–41)
Albumin: 2.2 g/dL — ABNORMAL LOW (ref 3.5–5.0)
Alkaline Phosphatase: 68 U/L (ref 38–126)
Anion gap: 10 (ref 5–15)
BUN: 9 mg/dL (ref 8–23)
CO2: 25 mmol/L (ref 22–32)
Calcium: 7.9 mg/dL — ABNORMAL LOW (ref 8.9–10.3)
Chloride: 102 mmol/L (ref 98–111)
Creatinine, Ser: 0.78 mg/dL (ref 0.44–1.00)
GFR, Estimated: >60 mL/min (ref >60)
Glucose, Bld: 108 mg/dL — ABNORMAL HIGH (ref 70–99)
Potassium: 4 mmol/L (ref 3.5–5.1)
Sodium: 137 mmol/L (ref 135–145)
Total Bilirubin: 0.4 mg/dL (ref 0.3–1.2)
Total Protein: 5.3 g/dL — ABNORMAL LOW (ref 6.5–8.1)

## 2022-12-06 LAB — IRON AND TIBC
Iron: 25 ug/dL — ABNORMAL LOW (ref 28–170)
Saturation Ratios: 13 % (ref 10.4–31.8)
TIBC: 199 ug/dL — ABNORMAL LOW (ref 250–450)
UIBC: 174 ug/dL

## 2022-12-06 LAB — FOLATE: Folate: 4 ng/mL — ABNORMAL LOW (ref >5.9)

## 2022-12-06 LAB — VITAMIN B12: Vitamin B-12: 846 pg/mL (ref 180–914)

## 2022-12-06 LAB — PHOSPHORUS: Phosphorus: 1.5 mg/dL — ABNORMAL LOW (ref 2.5–4.6)

## 2022-12-06 LAB — FERRITIN: Ferritin: 46 ng/mL (ref 11–307)

## 2022-12-06 LAB — MAGNESIUM: Magnesium: 1.5 mg/dL — ABNORMAL LOW (ref 1.7–2.4)

## 2022-12-06 MED ORDER — AMOXICILLIN-POT CLAVULANATE 500-125 MG PO TABS: 1.0000 | ORAL_TABLET | Freq: Three times a day (TID) | ORAL | Status: DC

## 2022-12-06 MED ORDER — AMOXICILLIN-POT CLAVULANATE 875-125 MG PO TABS
1.0000 | ORAL_TABLET | Freq: Two times a day (BID) | ORAL | Status: DC
  Administered 2022-12-06 – 2022-12-07 (×3): 1 via ORAL
  Filled 2022-12-06 (×4): qty 1

## 2022-12-06 NOTE — Progress Notes (Signed)
Pharmacy Antibiotic Note  Jeanette [name] is a 82 y.o. female admitted on 12/02/2022 with  parotitis  .  Pharmacy has been consulted for unasyn and vancomycin dosing.  CT with evidence of parotitis.  Patient has been on Unasyn / Vancomycin since admission. Unasyn changed to Augmentin 8/19.   Plan: Continue augmentin 875/125mg  Q12H.  Start Vancomycin 1000 mg IV every 24 hours (eAUC 491), will not get levels as patient likely d/c tomorrow.   Monitor WBC, temperature, and clinical picture  Height: 5\' 5"  (165.1 cm) Weight: 84.6 kg (186 lb 8.2 oz) IBW/kg (Calculated) : 57  Temp (24hrs), Avg:98 F (36.7 C), Min:97.7 F (36.5 C), Max:98.4 F (36.9 C)  Recent Labs  Lab 12/02/22 1303 12/02/22 1402 12/03/22 1001 12/04/22 0326 12/05/22 0132 12/06/22 0118  WBC 23.1*  --  19.4* 12.9* 14.6* 13.3*  CREATININE 0.74 0.60 0.70 0.64 0.62 0.78    Estimated Creatinine Clearance: 61.2 mL/min (by C-G formula based on SCr of 0.78 mg/dL).    Allergies  Allergen Reactions   Lidocaine Other (See Comments) and Palpitations    Reaction not recalled   Tramadol Itching   Rifampin Nausea And Vomiting    vomiting  Other Reaction(s): GI Intolerance  vomiting    Adenosine    Metoprolol Other (See Comments)    Reaction not recalled   Oxycodone Other (See Comments)    Overly-sedates the patient   Cefepime Nausea And Vomiting    Tolerates oxacillin  Brand name for Maxipime  Other Reaction(s): GI Intolerance    Antimicrobials this admission: 8/15 vancomycin >>  8/15 unasyn >>   Dose adjustments this admission:   Microbiology results:   Thank you for allowing pharmacy to be a part of this patient's care.  Estill Batten, PharmD, BCCCP  12/06/2022 1:32 PM

## 2022-12-06 NOTE — Progress Notes (Signed)
PROGRESS NOTE    Jeanette [name]  BMW:[id number] DOB: Oct 16, 1940 DOA: 12/02/2022 PCP: Crist Fat, MD  81/F w malignant neoplasm of the liver, Afib on Eliquis, asthma, type 2 diabetes, hyperlipidemia, hypertension, history of lumbar fusion and vertebral osteomyelitis in 2019 presented to the ED via EMS from [facility] for evaluation of swelling and redness to the right side of her face which began 2 months ago.  WBC 23.1,  CT soft tissue neck showing findings suggestive of severe parotiditis and no drainable fluid collection and asymmetrically edematous/thickened appearance of the right supraglottic larynx which is moderately narrowed, possibly infectious and secondary to the right parotid process, malignancy not excluded. And 1.5 cm left thyroid nodule -Seen by ENT in consultation, treated with IV antibiotics and steroids  Subjective: -feels better, swelling improving, tolerating diet  Assessment and Plan:  Severe parotitis Supraglottic airway narrowing -On admission WBC was 23 with CT evidence of severe parotitis, no drainable abscess and edema/narrowing of right supraglottic larynx -Seen by critical care as well as ENT -Treated with supportive care, IV Abx- Vanc/Unsayn and dexamethasone -On mechanical soft diet -change to PO Augmentin, home tomorrow    Incidental thyroid nodule TSH within normal limits Free T4 slightly elevated Outpatient endocrine follow-up   Hypomagnesemia QT prolongation Continue monitoring   Malignant neoplasm of the liver Per PCP note from 06/08/2022, patient had decided not to pursue further workup or treatment for this problem.  She was referred to palliative care. Follow-up with palliative care   Paroxysmal Afib Currently in sinus rhythm.   Continue metoprolol and Eliquis    Type 2 diabetes A1c 5.9   Asthma:  Stable, no signs of acute exacerbation As needed nebulization .  Hypertension: Resumed Toprol  Obesity, BMI is 31  DVT prophylaxis:  Continue Eliquis  Code Status: Full Code Family Communication: No family available at this time. Consults called: PCCM, ENT   Consultants:    Procedures:   Antimicrobials:    Objective: Vitals:   12/06/22 0800 12/06/22 0827 12/06/22 0834 12/06/22 1117  BP: (!) 139/44   (!) 134/55  Pulse: 62   68  Resp: 15   15  Temp:    97.8 F (36.6 C)  TempSrc:    Oral  SpO2: (!) 86% 90% 92% 90%  Weight:      Height:        Intake/Output Summary (Last 24 hours) at 12/06/2022 1136 Last data filed at 12/06/2022 0600 Gross per 24 hour  Intake --  Output 1175 ml  Net -1175 ml   Filed Weights   12/02/22 1243  Weight: 84.6 kg    Examination:  Gen: Awake, Alert, Oriented X 3, frail elderly HEENT: no JVD, no swelling appreciated Lungs: Good air movement bilaterally, CTAB CVS: S1S2/RRR Abd: soft, Non tender, non distended, BS present Extremities: No edema Skin: no new rashes on exposed skin     Data Reviewed:   CBC: Recent Labs  Lab 12/02/22 1303 12/02/22 1402 12/03/22 1001 12/04/22 0326 12/05/22 0132 12/06/22 0118  WBC 23.1*  --  19.4* 12.9* 14.6* 13.3*  NEUTROABS 19.7*  --   --   --   --  10.2*  HGB 12.6 13.9 12.1 11.8* 10.9* 11.5*  HCT 41.3 41.0 39.3 38.7 35.3* 37.4  MCV 82.6  --  81.9 81.3 81.7 84.0  PLT 192  --  160 147* 145* 151   Basic Metabolic Panel: Recent Labs  Lab 12/02/22 1303 12/02/22 1402 12/03/22 1001 12/04/22 0326 12/05/22 0132  12/06/22 0118  NA 135 134* 134* 139 136 137  K 3.8 3.9 3.2* 3.4* 4.5 4.0  CL 95* 98 98 99 102 102  CO2 24  --  24 27 26 25   GLUCOSE 126* 128* 143* 204* 132* 108*  BUN 12 15 14 15 13 9   CREATININE 0.74 0.60 0.70 0.64 0.62 0.78  CALCIUM 8.0*  --  7.6* 8.1* 7.9* 7.9*  MG 1.3*  --  2.1  --   --  1.5*  PHOS  --   --   --   --   --  1.5*   GFR: Estimated Creatinine Clearance: 61.2 mL/min (by C-G formula based on SCr of 0.78 mg/dL). Liver Function Tests: Recent Labs  Lab 12/02/22 1303 12/06/22 0118  AST 23 17   ALT 18 16  ALKPHOS 66 68  BILITOT 1.7* 0.4  PROT 5.9* 5.3*  ALBUMIN 2.5* 2.2*   No results for input(s): "LIPASE", "AMYLASE" in the last 168 hours. No results for input(s): "AMMONIA" in the last 168 hours. Coagulation Profile: No results for input(s): "INR", "PROTIME" in the last 168 hours. Cardiac Enzymes: No results for input(s): "CKTOTAL", "CKMB", "CKMBINDEX", "TROPONINI" in the last 168 hours. BNP (last 3 results) No results for input(s): "PROBNP" in the last 8760 hours. HbA1C: No results for input(s): "HGBA1C" in the last 72 hours. CBG: Recent Labs  Lab 12/05/22 1205 12/05/22 1618 12/05/22 2112 12/06/22 0715 12/06/22 1115  GLUCAP 115* 134* 124* 112* 170*   Lipid Profile: No results for input(s): "CHOL", "HDL", "LDLCALC", "TRIG", "CHOLHDL", "LDLDIRECT" in the last 72 hours. Thyroid Function Tests: No results for input(s): "TSH", "T4TOTAL", "FREET4", "T3FREE", "THYROIDAB" in the last 72 hours. Anemia Panel: Recent Labs    12/06/22 0118  VITAMINB12 846  FOLATE 4.0*  FERRITIN 46  TIBC 199*  IRON 25*  RETICCTPCT 1.2   Urine analysis:    Component Value Date/Time   COLORURINE YELLOW 09/13/2016 2210   APPEARANCEUR CLEAR 09/13/2016 2210   LABSPEC 1.011 09/13/2016 2210   PHURINE 7.0 09/13/2016 2210   GLUCOSEU NEGATIVE 09/13/2016 2210   HGBUR NEGATIVE 09/13/2016 2210   BILIRUBINUR small 05/12/2022 1418   KETONESUR 5 (A) 09/13/2016 2210   PROTEINUR Positive (A) 05/12/2022 1418   PROTEINUR 30 (A) 09/13/2016 2210   UROBILINOGEN 1.0 05/12/2022 1418   NITRITE positive 05/12/2022 1418   NITRITE NEGATIVE 09/13/2016 2210   LEUKOCYTESUR Trace (A) 05/12/2022 1418   Sepsis Labs: @LABRCNTIP (procalcitonin:4,lacticidven:4)  ) Recent Results (from the past 240 hour(s))  MRSA Next Gen by PCR, Nasal     Status: Abnormal   Collection Time: 12/05/22  2:51 PM   Specimen: Nasal Mucosa; Nasal Swab  Result Value Ref Range Status   MRSA by PCR Next Gen DETECTED (A) NOT  DETECTED Final    Comment: CRITICAL RESULT CALLED TO, READ BACK BY AND VERIFIED WITH: RN Ines Bloomer  [id number] BY J RAZZAK, MT (NOTE) The GeneXpert MRSA Assay (FDA approved for NASAL specimens only), is one component of a comprehensive MRSA colonization surveillance program. It is not intended to diagnose MRSA infection nor to guide or monitor treatment for MRSA infections. Test performance is not FDA approved in patients less than 38 years old. Performed at [facility], [address] [place], [place] [address]      Radiology Studies: No results found.   Scheduled Meds:  amLODipine  10 mg Oral Daily   apixaban  5 mg Oral BID   Chlorhexidine Gluconate Cloth  6 each  Topical Q0600   FLUoxetine  40 mg Oral Daily   insulin aspart  0-15 Units Subcutaneous TID AC & HS   metoprolol succinate  25 mg Oral Daily   mupirocin ointment  1 Application Nasal BID   pantoprazole  40 mg Oral Daily   Continuous Infusions:  sodium chloride 10 mL/hr at 12/04/22 0413   ampicillin-sulbactam (UNASYN) IV 3 g (12/06/22 0600)   vancomycin 1,000 mg (12/05/22 2302)     LOS: 4 days    Time spent:    Zannie Cove, MD [facility]   12/06/2022, 11:36 AM   m.

## 2022-12-06 NOTE — Progress Notes (Signed)
Hypoglycemic Event  CBG: 68  Treatment: 4 oz juice/soda  Symptoms: None  Follow-up CBG: Time:1521 CBG Result:80  Possible Reasons for Event: Inadequate meal intake - lunch <25%  Comments/MD notified: Sent secure chat to Dr. Jomarie Longs.    Lowella Bandy

## 2022-12-07 ENCOUNTER — Other Ambulatory Visit (HOSPITAL_COMMUNITY): Payer: Self-pay

## 2022-12-07 DIAGNOSIS — K1121 Acute sialoadenitis: Secondary | ICD-10-CM | POA: Diagnosis not present

## 2022-12-07 LAB — CBC
HCT: 36.7 % (ref 36.0–46.0)
Hemoglobin: 11.2 g/dL — ABNORMAL LOW (ref 12.0–15.0)
MCH: 25.2 pg — ABNORMAL LOW (ref 26.0–34.0)
MCHC: 30.5 g/dL (ref 30.0–36.0)
MCV: 82.5 fL (ref 80.0–100.0)
Platelets: 152 10*3/uL (ref 150–400)
RBC: 4.45 MIL/uL (ref 3.87–5.11)
RDW: 16 % — ABNORMAL HIGH (ref 11.5–15.5)
WBC: 10.6 10*3/uL — ABNORMAL HIGH (ref 4.0–10.5)
nRBC: 0 % (ref 0.0–0.2)

## 2022-12-07 LAB — BASIC METABOLIC PANEL
Anion gap: 9 (ref 5–15)
BUN: 6 mg/dL — ABNORMAL LOW (ref 8–23)
CO2: 29 mmol/L (ref 22–32)
Calcium: 8.2 mg/dL — ABNORMAL LOW (ref 8.9–10.3)
Chloride: 99 mmol/L (ref 98–111)
Creatinine, Ser: 0.6 mg/dL (ref 0.44–1.00)
GFR, Estimated: >60 mL/min (ref >60)
Glucose, Bld: 115 mg/dL — ABNORMAL HIGH (ref 70–99)
Potassium: 4 mmol/L (ref 3.5–5.1)
Sodium: 137 mmol/L (ref 135–145)

## 2022-12-07 LAB — GLUCOSE, CAPILLARY
Glucose-Capillary: 136 mg/dL — ABNORMAL HIGH (ref 70–99)
Glucose-Capillary: 137 mg/dL — ABNORMAL HIGH (ref 70–99)
Glucose-Capillary: 125 mg/dL — ABNORMAL HIGH (ref 70–99)

## 2022-12-07 MED ORDER — AMOXICILLIN-POT CLAVULANATE 875-125 MG PO TABS
1.0000 | ORAL_TABLET | Freq: Two times a day (BID) | ORAL | 0 refills | Status: AC
  Filled 2022-12-07: qty 6, 3d supply, fill #0

## 2022-12-07 MED ORDER — OXYCODONE HCL 5 MG PO TABS: 5.0000 mg | ORAL_TABLET | Freq: Four times a day (QID) | ORAL | 0 refills | Status: DC | PRN

## 2022-12-07 NOTE — Discharge Summary (Signed)
Physician Discharge Summary  Jeanette [name]:[id number] DOB: [birth year] DOA: 12/02/2022  PCP: Crist Fat, MD  Admit date: 12/02/2022 Discharge date: 12/07/2022  Time spent: 35 minutes  Recommendations for Outpatient Follow-up:  PCP in 1 week   Discharge Diagnoses:  Principal Problem:   Acute parotitis Active Problems:   DM2 (diabetes mellitus, type 2) (HCC)   HTN (hypertension)   Hyperlipemia   Asthma   Malignant neoplasm of liver (HCC)   Thyroid nodule   Hypomagnesemia   QT prolongation   Discharge Condition: Improved  Diet recommendation: Diabetic, low-sodium  Filed Weights   12/02/22 1243  Weight: 84.6 kg    History of present illness:  82/F w malignant neoplasm of the liver, Afib on Eliquis, asthma, type 2 diabetes, hyperlipidemia, hypertension, history of lumbar fusion and vertebral osteomyelitis in 2019 presented to the ED via EMS from [facility] for evaluation of swelling and redness to the right side of her face which began 2 months ago.  WBC 23.1,  CT soft tissue neck showing findings suggestive of severe parotiditis and no drainable fluid collection and asymmetrically edematous/thickened appearance of the right supraglottic larynx which is moderately narrowed, possibly infectious and secondary to the right parotid process, malignancy not excluded. And 1.5 cm left thyroid nodule -Seen by ENT in consultation, treated with IV antibiotics and steroids  Hospital Course:  Severe parotitis Supraglottic airway narrowing -On admission WBC was 23 with CT evidence of severe parotitis, no drainable abscess and edema/narrowing of right supraglottic larynx -Seen by critical care as well as ENT -Treated with supportive care, IV Abx- Vanc/Unsayn and dexamethasone -On mechanical soft diet -Clinically improved and stable, changed to oral Augmentin for few more days, continue mechanical soft diet, discharged home in stable condition, follow-up with PCP next week     Incidental thyroid nodule TSH within normal limits Free T4 slightly elevated Outpatient endocrine follow-up   Hypomagnesemia QT prolongation Continue monitoring   Malignant neoplasm of the liver Per PCP note from 06/08/2022, patient had decided not to pursue further workup or treatment for this problem.  She was referred to palliative care. Follow-up with palliative care   Paroxysmal Afib Currently in sinus rhythm.   Continue metoprolol and Eliquis    Type 2 diabetes A1c 5.9   Asthma:  Stable, no signs of acute exacerbation As needed nebulization .  Hypertension: Resumed Toprol   Obesity, BMI is 31    Discharge Exam: Vitals:   12/07/22 0754 12/07/22 1100  BP: (!) 161/67 (!) 146/52  Pulse: 62 60  Resp: 18 14  Temp: 97.7 F (36.5 C) 98 F (36.7 C)  SpO2: 98% 97%   Gen: Awake, Alert, Oriented X 3,  HEENT: no JVD Lungs: Good air movement bilaterally, CTAB CVS: S1S2/RRR Abd: soft, Non tender, non distended, BS present Extremities: No edema Skin: no new rashes on exposed skin   Discharge Instructions   Discharge Instructions     Diet - low sodium heart healthy   Complete by: As directed    Increase activity slowly   Complete by: As directed       Allergies as of 12/07/2022       Reactions   Lidocaine Other (See Comments), Palpitations   Reaction not recalled   Tramadol Itching   Rifampin Nausea And Vomiting   vomiting Other Reaction(s): GI Intolerance vomiting    Adenosine    Metoprolol Other (See Comments)   Reaction not recalled   Oxycodone Other (See Comments)   Overly-sedates  the patient   Cefepime Nausea And Vomiting   Tolerates oxacillin Brand name for Maxipime Other Reaction(s): GI Intolerance        Medication List     STOP taking these medications    linezolid 600 MG tablet Commonly known as: ZYVOX   metroNIDAZOLE 500 MG tablet Commonly known as: FLAGYL       TAKE these medications    Accu-Chek Softclix Lancets  lancets Use as instructed   acetaminophen 325 MG tablet Commonly known as: TYLENOL Take 650 mg by mouth every 4 (four) hours as needed for mild pain or moderate pain.   amLODipine 10 MG tablet Commonly known as: NORVASC Take 1 tablet (10 mg total) by mouth daily.   amoxicillin-clavulanate 875-125 MG tablet Commonly known as: AUGMENTIN Take 1 tablet by mouth every 12 (twelve) hours for 3 days.   apixaban 5 MG Tabs tablet Commonly known as: ELIQUIS Take 1 tablet (5 mg total) by mouth 2 (two) times daily.   FLUoxetine 40 MG capsule Commonly known as: PROZAC Take 40 mg by mouth daily.   guaifenesin 100 MG/5ML syrup Commonly known as: ROBITUSSIN Take 300 mg by mouth every 4 (four) hours as needed for cough.   hydrocortisone 25 MG suppository Commonly known as: ANUSOL-HC Place 25 mg rectally every 6 (six) hours as needed for hemorrhoids or anal itching.   magnesium hydroxide 400 MG/5ML suspension Commonly known as: MILK OF MAGNESIA Take 30 mLs by mouth daily as needed for mild constipation or moderate constipation.   metFORMIN 1000 MG tablet Commonly known as: GLUCOPHAGE Take 1,000 mg by mouth 2 (two) times daily with a meal.   metoprolol succinate 25 MG 24 hr tablet Commonly known as: TOPROL-XL Take 1 tablet (25 mg total) by mouth daily.   ondansetron 4 MG tablet Commonly known as: ZOFRAN Take 4 mg by mouth every 4 (four) hours as needed for nausea or vomiting.   oxyCODONE 5 MG immediate release tablet Commonly known as: Oxy IR/ROXICODONE Take 5 mg by mouth every 6 (six) hours as needed for severe pain.   pantoprazole 40 MG tablet Commonly known as: PROTONIX Take 40 mg by mouth daily.       Allergies  Allergen Reactions   Lidocaine Other (See Comments) and Palpitations    Reaction not recalled   Tramadol Itching   Rifampin Nausea And Vomiting    vomiting  Other Reaction(s): GI Intolerance  vomiting    Adenosine    Metoprolol Other (See Comments)     Reaction not recalled   Oxycodone Other (See Comments)    Overly-sedates the patient   Cefepime Nausea And Vomiting    Tolerates oxacillin  Brand name for Maxipime  Other Reaction(s): GI Intolerance      The results of significant diagnostics from this hospitalization (including imaging, microbiology, ancillary and laboratory) are listed below for reference.    Significant Diagnostic Studies: US THYROID  Result Date: 12/03/2022 CLINICAL DATA:  Thyroid nodule EXAM: THYROID ULTRASOUND TECHNIQUE: Ultrasound examination of the thyroid gland and adjacent soft tissues was performed. COMPARISON:  CT neck 12/02/2022 FINDINGS: Parenchymal Echotexture: Mildly heterogenous Isthmus: 0.6 cm Right lobe: 5.7 x 1.6 x 1.9 cm Left lobe: 3.9 x 1.8 x 1.2 cm _________________________________________________________ Estimated total number of nodules >/= 1 cm: 2 Number of spongiform nodules >/=  2 cm not described below (TR1): 0 Number of mixed cystic and solid nodules >/= 1.5 cm not described below (TR2): 0 _________________________________________________________ Nodule 1: 0.7 cm cystic nodule in  the superior right thyroid lobe does not meet criteria for imaging surveillance or FNA. _________________________________________________________ Nodule # 2: Location: Right; Mid Maximum size: 1.8 cm; Other 2 dimensions: 1.2 x 1.3 cm Composition: solid/almost completely solid (2) Echogenicity: isoechoic (1) Shape: not taller-than-wide (0) Margins: ill-defined (0) Echogenic foci: none (0) ACR TI-RADS total points: 3. ACR TI-RADS risk category: TR3 (3 points). ACR TI-RADS recommendations: *Given size (>/= 1.5 - 2.4 cm) and appearance, a follow-up ultrasound in 1 year should be considered based on TI-RADS criteria. _________________________________________________________ Nodule # 3: Location: Left; Mid Maximum size: 2.1 cm; Other 2 dimensions: 1.3 x 1.5 cm Composition: solid/almost completely solid (2) Echogenicity: isoechoic  (1) Shape: not taller-than-wide (0) Margins: ill-defined (0) Echogenic foci: none (0) ACR TI-RADS total points: 3. ACR TI-RADS risk category: TR3 (3 points). ACR TI-RADS recommendations: *Given size (>/= 1.5 - 2.4 cm) and appearance, a follow-up ultrasound in 1 year should be considered based on TI-RADS criteria. Nodule corresponds to the abnormality seen on neck CT. IMPRESSION: Nodules 2 and 3 meet criteria for imaging follow-up. Annual ultrasound surveillance is recommended until 5 years of stability is documented. The above is in keeping with the ACR TI-RADS recommendations - J Am Coll Radiol 2017;14:587-595. Electronically Signed   By: Acquanetta Belling M.D.   On: 12/03/2022 07:37   CT Soft Tissue Neck W Contrast  Result Date: 12/02/2022 CLINICAL DATA:  Soft tissue infection suspected, neck, no prior imaging facial swelling EXAM: CT NECK WITH CONTRAST TECHNIQUE: Multidetector CT imaging of the neck was performed using the standard protocol following the bolus administration of intravenous contrast. RADIATION DOSE REDUCTION: This exam was performed according to the departmental dose-optimization program which includes automated exposure control, adjustment of the mA and/or kV according to patient size and/or use of iterative reconstruction technique. CONTRAST:  75mL OMNIPAQUE IOHEXOL 350 MG/ML SOLN COMPARISON:  None Available. FINDINGS: Pharynx and larynx: Asymmetrically edematous/thickened appearance of the right supraglottic larynx (for example series 3, image 70) which is moderately narrowed. Salivary glands: Enlarged/edematous right parotid gland with surrounding edema. Nodularity of the right parotid gland. Left parotid gland and submandibular glands are unremarkable. Thyroid: Approximately 1.5 cm left thyroid nodule. Lymph nodes: Multiple areas of nodular in the right midgland, likely prominent lymph nodes. Mildly prominent right upper cervical chain nodes. Vascular: Aortic atherosclerosis. Limited  intracranial: Negative. Visualized orbits: Negative. Mastoids and visualized paranasal sinuses: Clear. Skeleton: No acute abnormality.  Multilevel degenerative change. Upper chest: Visualized lung apices are clear. IMPRESSION: 1. Enlarged/edematous right parotid gland with surrounding edema, suggestive of severe parotiditis. Nodularity within the right parotid likely represents inflamed lymph nodes. No drainable fluid collection. 2. Asymmetrically edematous/thickened appearance of the right supraglottic larynx which is moderately narrowed, probably infectious and secondary to the right parotid process. Correlation with direct visualization is recommended to exclude malignancy. 3. Approximately 1.5 cm left thyroid nodule. Recommend thyroid US (ref: J Am Coll Radiol. 2015 Feb;12(2): 143-50). Electronically Signed   By: Feliberto Harts M.D.   On: 12/02/2022 18:03   DG Chest 2 View  Result Date: 12/02/2022 CLINICAL DATA:  Hypoxia EXAM: CHEST - 2 VIEW COMPARISON:  None Available. FINDINGS: The heart size and mediastinal contours are within normal limits. Slight linear opacity left lung base likely scar or atelectasis. No consolidation, pneumothorax or effusion. No edema. Calcified and tortuous aorta. The visualized skeletal structures are unremarkable. Degenerative changes along the spine. Underinflation. IMPRESSION: Underinflation.  Slight left lung base atelectasis. Electronically Signed   By: Piedad Climes.D.  On: 12/02/2022 15:07    Microbiology: Recent Results (from the past 240 hour(s))  MRSA Next Gen by PCR, Nasal     Status: Abnormal   Collection Time: 12/05/22  2:51 PM   Specimen: Nasal Mucosa; Nasal Swab  Result Value Ref Range Status   MRSA by PCR Next Gen DETECTED (A) NOT DETECTED Final    Comment: CRITICAL RESULT CALLED TO, READ BACK BY AND VERIFIED WITH: RN Ines Bloomer  [id number] BY J RAZZAK, MT (NOTE) The GeneXpert MRSA Assay (FDA approved for NASAL specimens only), is one  component of a comprehensive MRSA colonization surveillance program. It is not intended to diagnose MRSA infection nor to guide or monitor treatment for MRSA infections. Test performance is not FDA approved in patients less than 16 years old. Performed at [facility], [address], [place], [place] [address]      Labs: Basic Metabolic Panel: Recent Labs  Lab 12/02/22 1303 12/02/22 1402 12/03/22 1001 12/04/22 0326 12/05/22 0132 12/06/22 0118 12/06/22 2331  NA 135   < > 134* 139 136 137 137  K 3.8   < > 3.2* 3.4* 4.5 4.0 4.0  CL 95*   < > 98 99 102 102 99  CO2 24  --  24 27 26 25 29   GLUCOSE 126*   < > 143* 204* 132* 108* 115*  BUN 12   < > 14 15 13 9  6*  CREATININE 0.74   < > 0.70 0.64 0.62 0.78 0.60  CALCIUM 8.0*  --  7.6* 8.1* 7.9* 7.9* 8.2*  MG 1.3*  --  2.1  --   --  1.5*  --   PHOS  --   --   --   --   --  1.5*  --    < > = values in this interval not displayed.   Liver Function Tests: Recent Labs  Lab 12/02/22 1303 12/06/22 0118  AST 23 17  ALT 18 16  ALKPHOS 66 68  BILITOT 1.7* 0.4  PROT 5.9* 5.3*  ALBUMIN 2.5* 2.2*   No results for input(s): "LIPASE", "AMYLASE" in the last 168 hours. No results for input(s): "AMMONIA" in the last 168 hours. CBC: Recent Labs  Lab 12/02/22 1303 12/02/22 1402 12/03/22 1001 12/04/22 0326 12/05/22 0132 12/06/22 0118 12/06/22 2331  WBC 23.1*  --  19.4* 12.9* 14.6* 13.3* 10.6*  NEUTROABS 19.7*  --   --   --   --  10.2*  --   HGB 12.6   < > 12.1 11.8* 10.9* 11.5* 11.2*  HCT 41.3   < > 39.3 38.7 35.3* 37.4 36.7  MCV 82.6  --  81.9 81.3 81.7 84.0 82.5  PLT 192  --  160 147* 145* 151 152   < > = values in this interval not displayed.   Cardiac Enzymes: No results for input(s): "CKTOTAL", "CKMB", "CKMBINDEX", "TROPONINI" in the last 168 hours. BNP: BNP (last 3 results) No results for input(s): "BNP" in the last 8760 hours.  ProBNP (last 3 results) No results for input(s): "PROBNP" in the last 8760  hours.  CBG: Recent Labs  Lab 12/06/22 1500 12/06/22 1521 12/06/22 2105 12/07/22 0752 12/07/22 1130  GLUCAP 68* 80 122* 136* 137*       Signed:  Zannie Cove MD.  [facility] 12/07/2022, 12:04 PM

## 2022-12-07 NOTE — Progress Notes (Signed)
Speech Language Pathology Treatment: Dysphagia  Patient Details Name: Jeanette [name] MRN: [id number] DOB: [birth year] Today's Date: 12/07/2022 Time: 1006-1020 SLP Time Calculation (min) (ACUTE ONLY): 14 min  Assessment / Plan / Recommendation Clinical Impression  Pt says that her face and her swallowing feel about the same as they did during initial evaluation, including some soreness and odynophagia. SLP provided skilled observation during intake from breakfast meal. Pt subjectively feels like she is having trouble swallowing but has no overt signs of dysphagia. She denies feeling like anything is getting stuck, but just thinks it is not going down right. Perhaps this may be attributed to her odynophagia. Education was provided about diet options, including the option to be softer if current diet is causing pain. She prefers to stay with the mechanical soft solids. Continue to anticipate that she will be able to progress as her pain and swelling improve further.    HPI HPI: Pt is a 82 yo female presenting from SNF 8/15 with R sided facial swelling and trouble swallowing. CT soft tissue neck suggestive of severe parotitis as well as moderate narrowing of the R supraglottic larynx. PMH includes: DM, HTN, HLD, atrial fib, recurrent UTI, and asthma, malignant neoplasm of liver, h/o lumbar fusion      SLP Plan  Continue with current plan of care      Recommendations for follow up therapy are one component of a multi-disciplinary discharge planning process, led by the attending physician.  Recommendations may be updated based on patient status, additional functional criteria and insurance authorization.    Recommendations  Diet recommendations: Dysphagia 3 (mechanical soft);Thin liquid Liquids provided via: Cup;Straw Medication Administration: Whole meds with liquid Supervision: Patient able to self feed;Intermittent supervision to cue for compensatory strategies Compensations: Minimize  environmental distractions;Slow rate;Small sips/bites Postural Changes and/or Swallow Maneuvers: Seated upright 90 degrees                  Oral care BID     Dysphagia, unspecified (R13.10)     Continue with current plan of care     Mahala Menghini., M.A. CCC-SLP Acute Rehabilitation Services Office [phone]  Secure chat preferred   12/07/2022, 10:25 AM

## 2022-12-07 NOTE — Inpatient Diabetes Management (Signed)
Inpatient Diabetes Program Recommendations  AACE/ADA: New Consensus Statement on Inpatient Glycemic Control   Target Ranges:  Prepandial:   less than 140 mg/dL      Peak postprandial:   less than 180 mg/dL (1-2 hours)      Critically ill patients:  140 - 180 mg/dL    Latest Reference Range & Units 12/06/22 07:15 12/06/22 11:15 12/06/22 15:00 12/06/22 15:21 12/06/22 21:05 12/07/22 07:52  Glucose-Capillary 70 - 99 mg/dL 295 (H) 621 (H) 68 (L) 80 122 (H) 136 (H)   Review of Glycemic Control  Diabetes history: DM2 Outpatient Diabetes medications: none Current orders for Inpatient glycemic control: Novolog 0-15 units AC&HS  Inpatient Diabetes Program Recommendations:    Insulin: Please consider decreasing Novolog correction to 0-9 units TID with meals and Novolog 0-5 units at bedtime.  Thanks, Orlando Penner, RN, MSN, CDCES Diabetes Coordinator Inpatient Diabetes Program [phone] (Team Pager from 8am to 5pm)

## 2022-12-07 NOTE — TOC Transition Note (Signed)
Transition of Care [facility]) - CM/SW Discharge Note   Patient Details  Name: Jeanette [name] MRN: [id number] Date of Birth: 1941/01/25  [facility] CM/SW Contact:  Primo Innis A [place], Theresia Majors Phone Number: 12/07/2022, 3:06 PM   Clinical Narrative:      Patient will DC to: [facility]  Anticipated DC date: 12/07/22  Family notified: Layne Benton  Transport by: Sharin Mons      Per MD patient ready for DC to [facility] . RN, patient, patient's family, and facility notified of DC. Discharge Summary and FL2 sent to facility. RN to call report prior to discharge ( Room [phone], [phone]). DC packet on chart. Ambulance transport requested for patient.     CSW will sign off for now as social work intervention is no longer needed. Please consult [place] again if new needs arise.   Final next level of care: Skilled Nursing Facility Barriers to Discharge: Barriers Resolved   Patient Goals and CMS Choice      Discharge Placement                Patient chooses bed at: [facility], [place] Patient to be transferred to facility by: [facility] Name of family member notified: Layne Benton Patient and family notified of of transfer: 12/07/22  Discharge Plan and Services Additional resources added to the After Visit Summary for                                       Social Determinants of Health (SDOH) Interventions SDOH Screenings   Transportation Needs: No Transportation Needs (01/14/2022)   Received [facility]  Depression [phone]): Low Risk  (06/23/2018)  Financial Resource Strain: Low Risk  (01/13/2022)   Received from [facility], [facility]  Social Connections: Unknown (01/13/2022)   Received from [facility], [facility]  Stress: No Stress Concern Present (01/13/2022)   Received from [facility], [facility]  Tobacco Use: Low Risk  (12/02/2022)     Readmission Risk Interventions     No data to display

## 2022-12-08 DIAGNOSIS — D649 Anemia, unspecified: Secondary | ICD-10-CM | POA: Diagnosis not present

## 2022-12-08 DIAGNOSIS — K1120 Sialoadenitis, unspecified: Secondary | ICD-10-CM | POA: Diagnosis not present

## 2022-12-08 DIAGNOSIS — R1310 Dysphagia, unspecified: Secondary | ICD-10-CM | POA: Diagnosis not present

## 2022-12-08 DIAGNOSIS — I4891 Unspecified atrial fibrillation: Secondary | ICD-10-CM | POA: Diagnosis not present

## 2023-01-05 DIAGNOSIS — L603 Nail dystrophy: Secondary | ICD-10-CM | POA: Diagnosis not present

## 2023-01-05 DIAGNOSIS — E1151 Type 2 diabetes mellitus with diabetic peripheral angiopathy without gangrene: Secondary | ICD-10-CM | POA: Diagnosis not present

## 2023-01-05 DIAGNOSIS — L602 Onychogryphosis: Secondary | ICD-10-CM | POA: Diagnosis not present

## 2023-01-14 DIAGNOSIS — D649 Anemia, unspecified: Secondary | ICD-10-CM | POA: Diagnosis not present

## 2023-01-14 DIAGNOSIS — C229 Malignant neoplasm of liver, not specified as primary or secondary: Secondary | ICD-10-CM | POA: Diagnosis not present

## 2023-01-14 DIAGNOSIS — E1142 Type 2 diabetes mellitus with diabetic polyneuropathy: Secondary | ICD-10-CM | POA: Diagnosis not present

## 2023-01-14 DIAGNOSIS — I4891 Unspecified atrial fibrillation: Secondary | ICD-10-CM | POA: Diagnosis not present

## 2023-01-20 DIAGNOSIS — F32A Depression, unspecified: Secondary | ICD-10-CM | POA: Diagnosis not present

## 2023-01-20 DIAGNOSIS — F413 Other mixed anxiety disorders: Secondary | ICD-10-CM | POA: Diagnosis not present

## 2023-02-14 DIAGNOSIS — F339 Major depressive disorder, recurrent, unspecified: Secondary | ICD-10-CM | POA: Diagnosis not present

## 2023-02-14 DIAGNOSIS — I119 Hypertensive heart disease without heart failure: Secondary | ICD-10-CM | POA: Diagnosis not present

## 2023-02-14 DIAGNOSIS — E1151 Type 2 diabetes mellitus with diabetic peripheral angiopathy without gangrene: Secondary | ICD-10-CM | POA: Diagnosis not present

## 2023-02-14 DIAGNOSIS — I4891 Unspecified atrial fibrillation: Secondary | ICD-10-CM | POA: Diagnosis not present

## 2023-03-17 DIAGNOSIS — R5381 Other malaise: Secondary | ICD-10-CM | POA: Diagnosis not present

## 2023-03-17 DIAGNOSIS — K219 Gastro-esophageal reflux disease without esophagitis: Secondary | ICD-10-CM | POA: Diagnosis not present

## 2023-03-17 DIAGNOSIS — I4891 Unspecified atrial fibrillation: Secondary | ICD-10-CM | POA: Diagnosis not present

## 2023-03-17 DIAGNOSIS — D649 Anemia, unspecified: Secondary | ICD-10-CM | POA: Diagnosis not present

## 2023-04-04 DIAGNOSIS — Z79899 Other long term (current) drug therapy: Secondary | ICD-10-CM | POA: Diagnosis not present

## 2023-04-05 DIAGNOSIS — I10 Essential (primary) hypertension: Secondary | ICD-10-CM | POA: Diagnosis not present

## 2023-04-05 DIAGNOSIS — E119 Type 2 diabetes mellitus without complications: Secondary | ICD-10-CM | POA: Diagnosis not present

## 2023-04-07 DIAGNOSIS — F32A Depression, unspecified: Secondary | ICD-10-CM | POA: Diagnosis not present

## 2023-04-07 DIAGNOSIS — F413 Other mixed anxiety disorders: Secondary | ICD-10-CM | POA: Diagnosis not present

## 2023-04-18 DIAGNOSIS — I4891 Unspecified atrial fibrillation: Secondary | ICD-10-CM | POA: Diagnosis not present

## 2023-04-18 DIAGNOSIS — K219 Gastro-esophageal reflux disease without esophagitis: Secondary | ICD-10-CM | POA: Diagnosis not present

## 2023-04-18 DIAGNOSIS — D649 Anemia, unspecified: Secondary | ICD-10-CM | POA: Diagnosis not present

## 2023-04-18 DIAGNOSIS — C229 Malignant neoplasm of liver, not specified as primary or secondary: Secondary | ICD-10-CM | POA: Diagnosis not present

## 2023-05-29 ENCOUNTER — Emergency Department (HOSPITAL_COMMUNITY): Payer: Medicare PPO

## 2023-05-29 ENCOUNTER — Other Ambulatory Visit: Payer: Self-pay

## 2023-05-29 ENCOUNTER — Encounter (HOSPITAL_COMMUNITY): Payer: Self-pay | Admitting: Emergency Medicine

## 2023-05-29 ENCOUNTER — Inpatient Hospital Stay (HOSPITAL_COMMUNITY)
Admission: EM | Admit: 2023-05-29 | Discharge: 2023-06-06 | DRG: 689 | Disposition: A | Discharge status: Skilled Nursing Facility | Payer: Medicare PPO | Source: Skilled Nursing Facility | Attending: Internal Medicine | Admitting: Internal Medicine

## 2023-05-29 DIAGNOSIS — Z8505 Personal history of malignant neoplasm of liver: Secondary | ICD-10-CM

## 2023-05-29 DIAGNOSIS — E871 Hypo-osmolality and hyponatremia: Secondary | ICD-10-CM | POA: Diagnosis present

## 2023-05-29 DIAGNOSIS — E1140 Type 2 diabetes mellitus with diabetic neuropathy, unspecified: Secondary | ICD-10-CM | POA: Diagnosis present

## 2023-05-29 DIAGNOSIS — G9341 Metabolic encephalopathy: Secondary | ICD-10-CM | POA: Diagnosis not present

## 2023-05-29 DIAGNOSIS — Z888 Allergy status to other drugs, medicaments and biological substances status: Secondary | ICD-10-CM

## 2023-05-29 DIAGNOSIS — E8809 Other disorders of plasma-protein metabolism, not elsewhere classified: Secondary | ICD-10-CM | POA: Diagnosis present

## 2023-05-29 DIAGNOSIS — D75839 Thrombocytosis, unspecified: Secondary | ICD-10-CM | POA: Diagnosis present

## 2023-05-29 DIAGNOSIS — G8929 Other chronic pain: Secondary | ICD-10-CM | POA: Diagnosis present

## 2023-05-29 DIAGNOSIS — Z1612 Extended spectrum beta lactamase (ESBL) resistance: Secondary | ICD-10-CM | POA: Diagnosis present

## 2023-05-29 DIAGNOSIS — Z87440 Personal history of urinary (tract) infections: Secondary | ICD-10-CM

## 2023-05-29 DIAGNOSIS — Z885 Allergy status to narcotic agent status: Secondary | ICD-10-CM

## 2023-05-29 DIAGNOSIS — E7800 Pure hypercholesterolemia, unspecified: Secondary | ICD-10-CM | POA: Diagnosis present

## 2023-05-29 DIAGNOSIS — Z9049 Acquired absence of other specified parts of digestive tract: Secondary | ICD-10-CM

## 2023-05-29 DIAGNOSIS — I480 Paroxysmal atrial fibrillation: Secondary | ICD-10-CM | POA: Diagnosis present

## 2023-05-29 DIAGNOSIS — N3000 Acute cystitis without hematuria: Secondary | ICD-10-CM

## 2023-05-29 DIAGNOSIS — Z90710 Acquired absence of both cervix and uterus: Secondary | ICD-10-CM

## 2023-05-29 DIAGNOSIS — F32A Depression, unspecified: Secondary | ICD-10-CM | POA: Diagnosis present

## 2023-05-29 DIAGNOSIS — Z8049 Family history of malignant neoplasm of other genital organs: Secondary | ICD-10-CM

## 2023-05-29 DIAGNOSIS — R531 Weakness: Secondary | ICD-10-CM | POA: Diagnosis present

## 2023-05-29 DIAGNOSIS — E870 Hyperosmolality and hypernatremia: Secondary | ICD-10-CM | POA: Diagnosis present

## 2023-05-29 DIAGNOSIS — R809 Proteinuria, unspecified: Secondary | ICD-10-CM | POA: Diagnosis present

## 2023-05-29 DIAGNOSIS — E669 Obesity, unspecified: Secondary | ICD-10-CM | POA: Diagnosis present

## 2023-05-29 DIAGNOSIS — J45909 Unspecified asthma, uncomplicated: Secondary | ICD-10-CM | POA: Diagnosis present

## 2023-05-29 DIAGNOSIS — Z79899 Other long term (current) drug therapy: Secondary | ICD-10-CM

## 2023-05-29 DIAGNOSIS — Z833 Family history of diabetes mellitus: Secondary | ICD-10-CM

## 2023-05-29 DIAGNOSIS — I447 Left bundle-branch block, unspecified: Secondary | ICD-10-CM | POA: Diagnosis present

## 2023-05-29 DIAGNOSIS — E872 Acidosis, unspecified: Secondary | ICD-10-CM | POA: Diagnosis present

## 2023-05-29 DIAGNOSIS — E1165 Type 2 diabetes mellitus with hyperglycemia: Secondary | ICD-10-CM | POA: Diagnosis present

## 2023-05-29 DIAGNOSIS — G928 Other toxic encephalopathy: Secondary | ICD-10-CM

## 2023-05-29 DIAGNOSIS — Z823 Family history of stroke: Secondary | ICD-10-CM

## 2023-05-29 DIAGNOSIS — Z7984 Long term (current) use of oral hypoglycemic drugs: Secondary | ICD-10-CM

## 2023-05-29 DIAGNOSIS — Z860100 Personal history of colon polyps, unspecified: Secondary | ICD-10-CM

## 2023-05-29 DIAGNOSIS — R2990 Unspecified symptoms and signs involving the nervous system: Secondary | ICD-10-CM

## 2023-05-29 DIAGNOSIS — Z6828 Body mass index (BMI) 28.0-28.9, adult: Secondary | ICD-10-CM

## 2023-05-29 DIAGNOSIS — I10 Essential (primary) hypertension: Secondary | ICD-10-CM | POA: Diagnosis present

## 2023-05-29 DIAGNOSIS — G9340 Encephalopathy, unspecified: Secondary | ICD-10-CM | POA: Diagnosis present

## 2023-05-29 DIAGNOSIS — K5900 Constipation, unspecified: Secondary | ICD-10-CM | POA: Diagnosis present

## 2023-05-29 DIAGNOSIS — Z8249 Family history of ischemic heart disease and other diseases of the circulatory system: Secondary | ICD-10-CM

## 2023-05-29 DIAGNOSIS — Z993 Dependence on wheelchair: Secondary | ICD-10-CM

## 2023-05-29 DIAGNOSIS — Z66 Do not resuscitate: Secondary | ICD-10-CM | POA: Diagnosis present

## 2023-05-29 DIAGNOSIS — D509 Iron deficiency anemia, unspecified: Secondary | ICD-10-CM | POA: Diagnosis present

## 2023-05-29 DIAGNOSIS — Z90711 Acquired absence of uterus with remaining cervical stump: Secondary | ICD-10-CM

## 2023-05-29 DIAGNOSIS — Z7901 Long term (current) use of anticoagulants: Secondary | ICD-10-CM

## 2023-05-29 DIAGNOSIS — Z981 Arthrodesis status: Secondary | ICD-10-CM

## 2023-05-29 DIAGNOSIS — N3001 Acute cystitis with hematuria: Secondary | ICD-10-CM | POA: Diagnosis not present

## 2023-05-29 DIAGNOSIS — R823 Hemoglobinuria: Secondary | ICD-10-CM | POA: Diagnosis present

## 2023-05-29 DIAGNOSIS — Z96652 Presence of left artificial knee joint: Secondary | ICD-10-CM | POA: Diagnosis present

## 2023-05-29 DIAGNOSIS — E039 Hypothyroidism, unspecified: Secondary | ICD-10-CM | POA: Diagnosis present

## 2023-05-29 DIAGNOSIS — B9620 Unspecified Escherichia coli [E. coli] as the cause of diseases classified elsewhere: Secondary | ICD-10-CM | POA: Diagnosis present

## 2023-05-29 LAB — COMPREHENSIVE METABOLIC PANEL
ALT: 13 U/L (ref 0–44)
AST: 23 U/L (ref 15–41)
Albumin: 2.8 g/dL — ABNORMAL LOW (ref 3.5–5.0)
Alkaline Phosphatase: 100 U/L (ref 38–126)
Anion gap: 17 — ABNORMAL HIGH (ref 5–15)
BUN: 12 mg/dL (ref 8–23)
CO2: 23 mmol/L (ref 22–32)
Calcium: 8.9 mg/dL (ref 8.9–10.3)
Chloride: 100 mmol/L (ref 98–111)
Creatinine, Ser: 0.67 mg/dL (ref 0.44–1.00)
GFR, Estimated: >60 mL/min (ref >60)
Glucose, Bld: 122 mg/dL — ABNORMAL HIGH (ref 70–99)
Potassium: 3.7 mmol/L (ref 3.5–5.1)
Sodium: 140 mmol/L (ref 135–145)
Total Bilirubin: 0.8 mg/dL (ref 0.0–1.2)
Total Protein: 7.3 g/dL (ref 6.5–8.1)

## 2023-05-29 LAB — RAPID URINE DRUG SCREEN, HOSP PERFORMED
Amphetamines: NOT DETECTED
Barbiturates: NOT DETECTED
Benzodiazepines: NOT DETECTED
Cocaine: NOT DETECTED
Opiates: NOT DETECTED
Tetrahydrocannabinol: NOT DETECTED

## 2023-05-29 LAB — CBC
HCT: 36.8 % (ref 36.0–46.0)
Hemoglobin: 11.1 g/dL — ABNORMAL LOW (ref 12.0–15.0)
MCH: 25.6 pg — ABNORMAL LOW (ref 26.0–34.0)
MCHC: 30.2 g/dL (ref 30.0–36.0)
MCV: 84.8 fL (ref 80.0–100.0)
Platelets: 153 10*3/uL (ref 150–400)
RBC: 4.34 MIL/uL (ref 3.87–5.11)
RDW: 15.9 % — ABNORMAL HIGH (ref 11.5–15.5)
WBC: 8.4 10*3/uL (ref 4.0–10.5)
nRBC: 0 % (ref 0.0–0.2)

## 2023-05-29 LAB — DIFFERENTIAL
Abs Immature Granulocytes: 0.02 10*3/uL (ref 0.00–0.07)
Basophils Absolute: 0 10*3/uL (ref 0.0–0.1)
Basophils Relative: 0 %
Eosinophils Absolute: 0.1 10*3/uL (ref 0.0–0.5)
Eosinophils Relative: 1 %
Immature Granulocytes: 0 %
Lymphocytes Relative: 25 %
Lymphs Abs: 2.1 10*3/uL (ref 0.7–4.0)
Monocytes Absolute: 0.5 10*3/uL (ref 0.1–1.0)
Monocytes Relative: 6 %
Neutro Abs: 5.7 10*3/uL (ref 1.7–7.7)
Neutrophils Relative %: 68 %

## 2023-05-29 LAB — URINALYSIS, ROUTINE W REFLEX MICROSCOPIC
Bilirubin Urine: NEGATIVE
Glucose, UA: NEGATIVE mg/dL
Ketones, ur: NEGATIVE mg/dL
Nitrite: POSITIVE — AB
Protein, ur: 30 mg/dL — AB
Specific Gravity, Urine: 1.025 (ref 1.005–1.030)
WBC, UA: >50 WBC/hpf (ref 0–5)
pH: 6 (ref 5.0–8.0)

## 2023-05-29 LAB — I-STAT CHEM 8, ED
BUN: 12 mg/dL (ref 8–23)
Calcium, Ion: 1.01 mmol/L — ABNORMAL LOW (ref 1.15–1.40)
Chloride: 101 mmol/L (ref 98–111)
Creatinine, Ser: 0.7 mg/dL (ref 0.44–1.00)
Glucose, Bld: 123 mg/dL — ABNORMAL HIGH (ref 70–99)
HCT: 36 % (ref 36.0–46.0)
Hemoglobin: 12.2 g/dL (ref 12.0–15.0)
Potassium: 3.7 mmol/L (ref 3.5–5.1)
Sodium: 140 mmol/L (ref 135–145)
TCO2: 26 mmol/L (ref 22–32)

## 2023-05-29 LAB — PROTIME-INR
INR: 1.2 (ref 0.8–1.2)
Prothrombin Time: 15.1 s (ref 11.4–15.2)

## 2023-05-29 LAB — CBG MONITORING, ED: Glucose-Capillary: 111 mg/dL — ABNORMAL HIGH (ref 70–99)

## 2023-05-29 LAB — APTT: aPTT: 30 s (ref 24–36)

## 2023-05-29 LAB — ETHANOL: Alcohol, Ethyl (B): <10 mg/dL (ref <10)

## 2023-05-29 MED ORDER — SODIUM CHLORIDE 0.9 % IV SOLN
1.0000 g | INTRAVENOUS | Status: DC
  Administered 2023-05-29 – 2023-05-31 (×3): 1 g via INTRAVENOUS
  Filled 2023-05-29 (×3): qty 10

## 2023-05-29 MED ORDER — IOHEXOL 350 MG/ML SOLN
75.0000 mL | Freq: Once | INTRAVENOUS | Status: AC | PRN
  Administered 2023-05-29: 75 mL via INTRAVENOUS

## 2023-05-29 NOTE — ED Provider Notes (Addendum)
  EMERGENCY DEPARTMENT AT [facility] Provider Note   CSN: [id number] Arrival date & time: 05/29/23  2230  An emergency department physician performed an initial assessment on this suspected stroke patient at 2230.  History  Chief Complaint  Patient presents with   Code Stroke    Jeanette [name] is a 83 y.o. female who presents from collapse SNF due to decreased responsiveness, reported last known well was 9 PM, however per neurologist discussion with facility, unable to truly identify last known well.  Patient is anticoagulated on Eliquis  for A-fib, DNR signed at the bedside.  Wheelchair-bound at baseline, right-sided gaze preference.  Level 5 caveat due to acuity of presentation upon arrival.  Airway cleared at the bridge by Dr. Dasie, EDP.  Accompanied to CT by Dr. Voncile, neurologist.  History of type 2 diabetes, hypertension, osteomyelitis, liver cancer with incomplete workup du eot patient preference for palliative approach, per PCP note 05/2022.  HPI     Home Medications Prior to Admission medications   Medication Sig Start Date End Date Taking? Authorizing Provider  Accu-Chek Softclix Lancets lancets Use as instructed 04/27/22   Fleeta Finger, Selinda, MD  acetaminophen  (TYLENOL ) 325 MG tablet Take 650 mg by mouth every 4 (four) hours as needed for mild pain or moderate pain.    [provider]  amLODipine  (NORVASC ) 10 MG tablet Take 1 tablet (10 mg total) by mouth daily. 03/29/22   Fleeta Finger Selinda, MD  apixaban  (ELIQUIS ) 5 MG TABS tablet Take 1 tablet (5 mg total) by mouth 2 (two) times daily. 03/29/22   Fleeta Finger Selinda, MD  FLUoxetine  (PROZAC ) 40 MG capsule Take 40 mg by mouth daily.    [provider]  guaifenesin (ROBITUSSIN) 100 MG/5ML syrup Take 300 mg by mouth every 4 (four) hours as needed for cough.    [provider]  hydrocortisone (ANUSOL-HC) 25 MG suppository Place 25 mg rectally every 6 (six) hours as needed for hemorrhoids or  anal itching.    [provider]  magnesium  hydroxide (MILK OF MAGNESIA) 400 MG/5ML suspension Take 30 mLs by mouth daily as needed for mild constipation or moderate constipation.    [provider]  metFORMIN  (GLUCOPHAGE ) 1000 MG tablet Take 1,000 mg by mouth 2 (two) times daily with a meal.    [provider]  metoprolol  succinate (TOPROL -XL) 25 MG 24 hr tablet Take 1 tablet (25 mg total) by mouth daily. 03/29/22   Fleeta Finger Selinda, MD  ondansetron  (ZOFRAN ) 4 MG tablet Take 4 mg by mouth every 4 (four) hours as needed for nausea or vomiting.    [provider]  oxyCODONE  (OXY IR/ROXICODONE ) 5 MG immediate release tablet Take 1 tablet (5 mg total) by mouth every 6 (six) hours as needed for severe pain. 12/07/22   Fairy Frames, MD  pantoprazole  (PROTONIX ) 40 MG tablet Take 40 mg by mouth daily.    [provider]      Allergies    Lidocaine, Tramadol, Rifampin , Adenosine, Metoprolol , Oxycodone , and Cefepime    Review of Systems   Review of Systems  Unable to perform ROS: Mental status change    Physical Exam Updated Vital Signs BP (!) 164/62 (BP Location: Right Arm)   Pulse 81   Temp 97.6 F (36.4 C) (Oral)   Resp 14   Wt 79 kg   SpO2 97%   BMI 28.98 kg/m  Physical Exam Vitals and nursing note reviewed.  Constitutional:  Appearance: She is obese. She is ill-appearing. She is not toxic-appearing.  HENT:     Head: Normocephalic and atraumatic.     Mouth/Throat:     Mouth: Mucous membranes are moist.     Pharynx: No oropharyngeal exudate or posterior oropharyngeal erythema.  Eyes:     General:        Right eye: No discharge.        Left eye: No discharge.     Extraocular Movements:     Right eye: Abnormal extraocular motion present.     Left eye: Abnormal extraocular motion present.     Conjunctiva/sclera: Conjunctivae normal.     Pupils: Pupils are equal, round, and reactive to light.     Comments: Right-sided gaze  preference, PERRL, patient does not follow EOMs past midline towards the left  Neck:     Trachea: Trachea normal.  Cardiovascular:     Rate and Rhythm: Normal rate and regular rhythm.     Pulses: Normal pulses.     Heart sounds: Normal heart sounds. No murmur heard. Pulmonary:     Effort: Pulmonary effort is normal. No respiratory distress.     Breath sounds: Normal breath sounds. No wheezing or rales.  Abdominal:     General: Bowel sounds are normal. There is no distension.     Palpations: Abdomen is soft.     Tenderness: There is no abdominal tenderness. There is no guarding or rebound.  Musculoskeletal:        General: No deformity.     Cervical back: Neck supple.     Right lower leg: No edema.     Left lower leg: No edema.  Skin:    General: Skin is warm and dry.     Capillary Refill: Capillary refill takes less than 2 seconds.  Neurological:     General: No focal deficit present.     Mental Status: She is alert.     GCS: GCS eye subscore is 4. GCS verbal subscore is 3. GCS motor subscore is 6.     Comments: Unable to test sensation due to patient's poor verbal response.  Left-sided weakness, patient unable to sustain left arm off the bed, question neglect, unable to perform coordination testing, gait deferred for patient safety.  Psychiatric:        Mood and Affect: Mood normal.     ED Results / Procedures / Treatments   Labs (all labs ordered are listed, but only abnormal results are displayed) Labs Reviewed  CBC - Abnormal; Notable for the following components:      Result Value   Hemoglobin 11.1 (*)    MCH 25.6 (*)    RDW 15.9 (*)    All other components within normal limits  COMPREHENSIVE METABOLIC PANEL - Abnormal; Notable for the following components:   Glucose, Bld 122 (*)    Albumin 2.8 (*)    Anion gap 17 (*)    All other components within normal limits  URINALYSIS, ROUTINE W REFLEX MICROSCOPIC - Abnormal; Notable for the following components:    APPearance CLOUDY (*)    Hgb urine dipstick SMALL (*)    Protein, ur 30 (*)    Nitrite POSITIVE (*)    Leukocytes,Ua LARGE (*)    Bacteria, UA MANY (*)    All other components within normal limits  I-STAT CHEM 8, ED - Abnormal; Notable for the following components:   Glucose, Bld 123 (*)    Calcium , Ion 1.01 (*)  All other components within normal limits  CBG MONITORING, ED - Abnormal; Notable for the following components:   Glucose-Capillary 111 (*)    All other components within normal limits  URINE CULTURE  ETHANOL  PROTIME-INR  APTT  DIFFERENTIAL  RAPID URINE DRUG SCREEN, HOSP PERFORMED  AMMONIA    EKG EKG Interpretation Date/Time:  Sunday May 29 2023 22:53:36 EST Ventricular Rate:  82 PR Interval:  120 QRS Duration:  146 QT Interval:  446 QTC Calculation: 521 R Axis:   -46  Text Interpretation: Sinus rhythm Left bundle branch block Confirmed by Jerrol Agent ([id number]) on 05/29/2023 11:08:46 PM  Radiology CT ANGIO HEAD NECK W WO CM (CODE STROKE) Result Date: 05/29/2023 CLINICAL DATA:  Initial evaluation for acute neuro deficit, stroke suspected, left-sided weakness, right gaze, aphasia. EXAM: CT ANGIOGRAPHY HEAD AND NECK WITH AND WITHOUT CONTRAST TECHNIQUE: Multidetector CT imaging of the head and neck was performed using the standard protocol during bolus administration of intravenous contrast. Multiplanar CT image reconstructions and MIPs were obtained to evaluate the vascular anatomy. Carotid stenosis measurements (when applicable) are obtained utilizing NASCET criteria, using the distal internal carotid diameter as the denominator. RADIATION DOSE REDUCTION: This exam was performed according to the departmental dose-optimization program which includes automated exposure control, adjustment of the mA and/or kV according to patient size and/or use of iterative reconstruction technique. CONTRAST:  75mL OMNIPAQUE  IOHEXOL  350 MG/ML SOLN COMPARISON:  CT from earlier the same  day. FINDINGS: CTA NECK FINDINGS Aortic arch: Visualized aortic arch within normal limits for caliber with standard branch pattern. Moderately advanced atheromatous change about the arch itself. Associated short-segment 65% stenosis at the proximal left subclavian artery (series 6, image 58). Right carotid system: Right common and internal carotid arteries are tortuous but patent without dissection. Calcified plaque about the right carotid bulb without hemodynamically significant greater than 50% stenosis. Left carotid system: Left common and internal carotid arteries are tortuous but patent without dissection. Calcified plaque about the left carotid bulb without hemodynamically significant greater than 50% stenosis. Irregularity about the mid-distal cervical left ICA, suggestive of FMD. Vertebral arteries: Left vertebral artery arises directly from the aortic arch. Atheromatous change at the origins of both vertebral arteries with up to moderate stenosis on the left (series 6, image 55). Vertebral arteries otherwise patent distally without significant stenosis or dissection. Skeleton: No discrete or worrisome osseous lesions. Mild chronic height loss noted at the superior endplates of T1 and T2. Advanced osteoarthritic changes noted about the C1-2 articulations. Moderate to advanced spondylosis at C5-6 and C6-7. Other neck: No other acute finding.  1.8 cm left thyroid  nodule. Upper chest: Visualized upper chest demonstrates no acute finding. Review of the MIP images confirms the above findings CTA HEAD FINDINGS Anterior circulation: Atheromatous change about the carotid siphons without hemodynamically significant stenosis. A1 segments patent bilaterally. Normal anterior communicating artery complex. Anterior cerebral arteries patent without stenosis. No M1 stenosis or occlusion. Distal MCA branches perfused and symmetric. Posterior circulation: Both V4 segments patent without significant stenosis. Both PICA patent  at their origins. Basilar patent without stenosis. Superior cerebral arteries patent bilaterally. Left PCA supplied via the basilar. Fetal type origin of the right PCA. Atheromatous irregularity about the left PCA with associated mild to moderate left P2 stenoses (series 10, image 19). PCAs otherwise widely patent to their distal aspects. Venous sinuses: Patent allowing for timing the contrast bolus. Anatomic variants: As above.  No aneurysm. Review of the MIP images confirms the above findings IMPRESSION: 1. Negative  CTA for large vessel occlusion or other emergent finding. 2. Atheromatous change about the carotid bifurcations and carotid siphons without hemodynamically significant greater than 50% stenosis. Mild-to-moderate left P2 stenoses as above. 3. Irregularity about the mid-distal cervical left ICA, suggestive of FMD. 4. Atheromatous change at the origins of both vertebral arteries with up to moderate stenosis on the left. Left vertebral artery arises directly from the aortic arch. 5. Short-segment 65% stenosis at the proximal left subclavian artery. 6.  Aortic Atherosclerosis (ICD10-I70.0). 7. 1.8 cm left thyroid  nodule. Further evaluation with dedicated thyroid  ultrasound recommended (ref: J Am Coll Radiol. 2015 Feb;12(2): 143-50). These results were communicated to Dr. Arora at 10:55 pm on 05/29/2023 by text page via the [facility] messaging system. Electronically Signed   By: Morene Hoard M.D.   On: 05/29/2023 23:12   CT HEAD CODE STROKE WO CONTRAST Result Date: 05/29/2023 CLINICAL DATA:  Code stroke. Initial evaluation for acute neuro deficit, stroke suspected. EXAM: CT HEAD WITHOUT CONTRAST TECHNIQUE: Contiguous axial images were obtained from the base of the skull through the vertex without intravenous contrast. RADIATION DOSE REDUCTION: This exam was performed according to the departmental dose-optimization program which includes automated exposure control, adjustment of the mA and/or kV according  to patient size and/or use of iterative reconstruction technique. COMPARISON:  Prior study from 09/14/2016 FINDINGS: Brain: Age-related cerebral atrophy with moderate chronic microischemic disease. No acute intracranial hemorrhage. No acute large vessel territory infarct. No mass lesion or midline shift. No hydrocephalus or extra-axial fluid collection. Vascular: No abnormal hyperdense vessel. Calcified atherosclerosis present at the skull base. Skull: Scalp soft tissues within normal limits for calvarium intact. Sinuses/Orbits: Globes and orbital soft tissues demonstrate no acute finding. Paranasal sinuses and mastoid air cells are clear. Other: None. ASPECTS [facility] Stroke Program Early CT Score) - Ganglionic level infarction (caudate, lentiform nuclei, internal capsule, insula, M1-M3 cortex): 7 - Supraganglionic infarction (M4-M6 cortex): 3 Total score (0-10 with 10 being normal): 10 IMPRESSION: 1. No acute intracranial abnormality. 2. ASPECTS is 10. 3. Age-related cerebral atrophy with moderate chronic microvascular ischemic disease. These results were communicated to Dr. Arora at 10:48 pm on 05/29/2023 by text page via the [facility] messaging system. Electronically Signed   By: Morene Hoard M.D.   On: 05/29/2023 22:49    Procedures .Critical Care  Performed by: Bobette Pleasant SAUNDERS, PA-C Authorized by: Bobette Pleasant SAUNDERS, PA-C   Critical care provider statement:    Critical care time (minutes):  45   Critical care was time spent personally by me on the following activities:  Development of treatment plan with patient or surrogate, discussions with consultants, evaluation of patient's response to treatment, examination of patient, obtaining history from patient or surrogate, ordering and performing treatments and interventions, ordering and review of laboratory studies, ordering and review of radiographic studies, pulse oximetry and re-evaluation of patient's condition     Medications  Ordered in ED Medications  cefTRIAXone  (ROCEPHIN ) 1 g in sodium chloride  0.9 % 100 mL IVPB (1 g Intravenous New Bag/Given 05/29/23 2346)  iohexol  (OMNIPAQUE ) 350 MG/ML injection 75 mL (75 mLs Intravenous Contrast Given 05/29/23 2246)    ED Course/ Medical Decision Making/ A&P Clinical Course as of 05/30/23 0010  Sun May 29, 2023  2306 Per Dr. Voncile, neuro, who states there is no LVO but patient does have right-sided gaze preference and neglect of the left concerning for possible CVA.  Had related presentation with UTI in the past.  Recommend at minimum observation admission.  I  appreciate his collaboration in the care of the patient. [RS]  Mon May 30, 2023  0003 Consult to hospital medicine Dr. Lou, who is agreeable remains patient to service.  Appreciate collaboration of care this patient. [RS]    Clinical Course User Index [RS] Eun Vermeer, Pleasant SAUNDERS, PA-C                                 Medical Decision Making 83 year old female with decreased level of consciousness and left-sided weakness.  Hypertensive on intake vitals otherwise normal.  Cardiopulmonary exam unremarkable, abdominal exam is benign.  See above for neuroexam.  The differential diagnosis for AMS is extensive and includes, but is not limited to:  Drug overdose - opioids, alcohol , sedatives, antipsychotics, drug withdrawal, others Metabolic: hypoxia, hypoglycemia, hyperglycemia, hypercalcemia, hypernatremia, hyponatremia, uremia, hepatic encephalopathy, hypothyroidism, hyperthyroidism, vitamin B12 or thiamine  deficiency, carbon monoxide poisoning, Wilson's disease, Lactic acidosis, DKA/HHOS Infectious: meningitis, encephalitis, bacteremia/sepsis, urinary tract infection, pneumonia, neurosyphilis Structural: Space-occupying lesion, (brain tumor, subdural hematoma, hydrocephalus,) Vascular: stroke, subarachnoid hemorrhage, coronary ischemia, hypertensive encephalopathy, CNS vasculitis, thrombotic thrombocytopenic purpura,  disseminated intravascular coagulation, hyperviscosity Psychiatric: Schizophrenia, depression; Other: Seizure, hypothermia, heat stroke, ICU psychosis, dementia -sundowning.   Amount and/or Complexity of Data Reviewed Labs: ordered.    Details: CBC with anemia with hemoglobin 11, CMP with elevated gap of 17, INR is normal, alcohol  is negative.  UA with infection Radiology: ordered.    Details: CT head negative for acute cranial abnormality, CTA negative for LVO,   Risk Decision regarding hospitalization.   Patient will require admission to the hospital for UTI with delirium, as well as for completion of CVA workup with MR.   Unable to contact family via telephone.   Please see the assessment from neurologist Dr. Voncile: I appreciate his collaboration in the care of this patient.   Cassity B Kealey is a 83 y.o. female with hx of atrial fibrillation on Eliquis  last dose this evening, hypertension, hyperlipidemia, obesity, chronic back pain and multiple back surgeries, knee osteomyelitis, impaired ambulation uses wheelchair for ambulation diabetes, diabetic neuropathy, depression, presented to the emergency department from her nursing home for evaluation of speech difficulty and left-sided weakness. On examination she did have some left-sided weakness and almost left-sided neglect with some rightward gaze preference. Family provides history of frequent UTIs. She is on Eliquis -not a candidate for thrombolysis. CTA head and neck did not reveal any LVO  At this time I suspect either small cortical infarcts versus an underlying infection that is causing encephalopathy to be the cause of her current presentation as she does have significantly diminished attention concentration as well.  RECOMMENDATIONS Check for infectious etiology-UA, chest x-ray Stat MRI of the brain. If the MRI is positive for stroke, then only pursue stroke risk factor workup that should include 2D echo, A1c, lipid panel. Blood  pressure parameters will also depend on the MRI findings.  If there is a stroke, allow for permissive hypertension up to 220 systolic otherwise the blood pressure goals would be normotension.    Consult to hospital medicine as above.   This chart was dictated using voice recognition software, Dragon. Despite the best efforts of this provider to proofread and correct errors, errors may still occur which can change documentation meaning.         Final Clinical Impression(s) / ED Diagnoses Final diagnoses:  None    Rx / DC Orders ED Discharge Orders  None         Bobette Pleasant SAUNDERS, PA-C 05/30/23 0010    Finleigh Cheong, Pleasant SAUNDERS, PA-C 06/09/23 2220    Bobette Pleasant SAUNDERS DEVONNA 06/28/23 2213    Jerrol Agent, MD 06/29/23 [phone]

## 2023-05-29 NOTE — ED Triage Notes (Signed)
 Pt BIB by EMS for left-sided weakness and decrease responsiveness. Reported LKWT 2000. Hx of Afib, on Eliquis . DNR form at bedside. Wheelchair bound, per facility. Possible right gaze preference on exam. Transported to CT with Dr. Bonnita Buttner and stroke team.

## 2023-05-29 NOTE — Code Documentation (Signed)
 Responded to Code Stroke called at 2157 for L sided weakness, R gaze, and aphasia, LSN-2000. Pt arrived at 2230, CBG-111, NIH-9, CT head negative for acute changes, CTA-no LVO. TNK not given-pt on eliquis . Plan admission, metabolic w/o, MRI. Please complete  VS/neuros checks q2h x 12, then q4hl.

## 2023-05-29 NOTE — ED Notes (Signed)
 RN called CCMD for cardiac monitoring.

## 2023-05-29 NOTE — Consult Note (Signed)
 NEUROLOGY CONSULT NOTE   Date of service: May 29, 2023 Patient Name: Jeanette [name] MRN:  [id number] DOB:  05/02/1940 Chief Complaint: Code stroke via [facility]-left-sided weakness and difficulty with speech Requesting Provider: Dasie Faden, MD  History of Present Illness  Jeanette [name] is a 83 y.o. female with hx of atrial fibrillation on Eliquis  last dose this evening, hypertension, hyperlipidemia, obesity, chronic back pain and multiple back surgeries, knee osteomyelitis, impaired ambulation uses wheelchair for ambulation diabetes, diabetic neuropathy, depression, presented to the emergency department from her nursing home for evaluation of speech difficulty and left-sided weakness.  Last she was checked and was in her normal state of health was at 8 PM on 05/29/2023 and somewhere after 9:00 was noted to be having the symptoms described above.  Patient is a poor historian.  I spoke with the patient's daughter over the phone who said that the patient was moved to a nursing home from independent living because she was having a hard time taking her medications and keeping her place straight due to back pain and knee pain.  She also been having frequent UTIs. According to the daughter, Ms. Gramajo can walk but chooses to use a wheelchair as it is less painful to her osteomyelitis knee.  She describes the patient as fully conversant, awake and oriented at baseline.   LKW: 8 PM Modified rankin score: 3-Moderate disability-requires help but walks WITHOUT assistance IV Thrombolysis: On Eliquis  EVT: No ELVO  NIHSS components Score: Comment  1a Level of Conscious 0[x]  1[]  2[]  3[]      1b LOC Questions 0[]  1[x]  2[]       1c LOC Commands 0[]  1[x]  2[]       2 Best Gaze 0[]  1[x]  2[]       3 Visual 0[x]  1[]  2[]  3[]      4 Facial Palsy 0[x]  1[]  2[]  3[]      5a Motor Arm - left 0[]  1[x]  2[]  3[]  4[]  UN[]    5b Motor Arm - Right 0[x]  1[]  2[]  3[]  4[]  UN[]    6a Motor Leg - Left 0[]  1[x]  2[]  3[]  4[]   UN[]    6b Motor Leg - Right 0[x]  1[]  2[]  3[]  4[]  UN[]    7 Limb Ataxia 0[x]  1[]  2[]  3[]  UN[]     8 Sensory 0[x]  1[]  2[]  UN[]      9 Best Language 0[]  1[x]  2[]  3[]      10 Dysarthria 0[]  1[x]  2[]  UN[]      11 Extinct. and Inattention 0[]  1[]  2[x]       TOTAL: 9      ROS  Comprehensive ROS performed and pertinent positives documented in HPI   Past History   Past Medical History:  Diagnosis Date   Abscess of lower back 09/13/2016   Allergic rhinitis    Anemia 11/11/2017   Last Assessment & Plan:  Formatting of this note is different from the original. Lab Results  Component Value Date   WBC 5.8 11/16/2017   HGB 8.9 (L) 11/16/2017   HCT 27.9 (L) 11/16/2017   MCV 78.1 (L) 11/16/2017   PLT 264 11/16/2017   -Transfuse hemoglobin less than 7 - Continue iron supplement   Aortic atherosclerosis (HCC)    Arthralgia of hip 03/01/2017   Asthma    Colon polyp    Depressive disorder    Diabetic neuropathy (HCC)    DM2 (diabetes mellitus, type 2) (HCC) 09/13/2016   Encephalopathy 09/13/2016   Fat necrosis of skin 09/13/2016   High cholesterol    History of lumbar  fusion 12/06/2017   History of surgical site infection 08/10/2017   Formatting of this note might be different from the original. Added automatically from request for surgery [phone]   History of total left knee replacement 05/12/2018   HTN (hypertension) 09/13/2016   Hyperlipemia    Impaired ambulation 11/14/2017   Last Assessment & Plan:  Formatting of this note might be different from the original. PT eval PT D/C Recs: 24/7 s/PRN a and HHPT. If appropriate assistance not available at home, rec inpt post acute therapy/low intensity prior to D/C home.   Infected orthopedic implant, subsequent encounter 05/12/2018   Lumbar degenerative disc disease 03/01/2017   Lumbar radiculopathy 03/01/2017   Obesity    Osteoarthritis    Respiratory failure, acute (HCC) 08/29/2016   Vertebral osteomyelitis (HCC) 11/10/2017   Last Assessment & Plan:  Formatting  of this note might be different from the original. Osteomyelitis at L5 and T12 seen on MRI on 7/25  PLAN  -  OSH cultures growing Staph Ludgunensis, oxacillin susceptible. ABX adjusted ID consulted, appreciate recs  -  Oxacillin and Rifampin  until 9/15  Ortho consulted - concerned for acute OM - no surgical intervention - IV abx x 6 weeks   IR consulted  - CT gu    Past Surgical History:  Procedure Laterality Date   ABDOMINAL HYSTERECTOMY     APPENDECTOMY     BACK SURGERY     CARDIAC CATHETERIZATION     Normal in 2002 and 2009   CHOLECYSTECTOMY     KNEE SURGERY Bilateral    SHOULDER SURGERY     SPINE SURGERY      Family History: Family History  Problem Relation Age of Onset   Hypertension Mother    Diabetes Mother    Stroke Mother    Hypertension Father    Heart disease Father    Diabetes Sister    Cervical cancer Sister    Cancer Brother     Social History  reports that she has never smoked. She has never used smokeless tobacco. She reports that she does not drink alcohol  and does not use drugs.  Allergies  Allergen Reactions   Lidocaine Other (See Comments) and Palpitations    Reaction not recalled   Tramadol Itching   Rifampin  Nausea And Vomiting    vomiting  Other Reaction(s): GI Intolerance  vomiting    Adenosine    Metoprolol  Other (See Comments)    Reaction not recalled   Oxycodone  Other (See Comments)    Overly-sedates the patient   Cefepime Nausea And Vomiting    Tolerates oxacillin  Brand name for Maxipime  Other Reaction(s): GI Intolerance    Medications  No current facility-administered medications for this encounter.  Current Outpatient Medications:    Accu-Chek Softclix Lancets lancets, Use as instructed, Disp: 100 each, Rfl: 12   acetaminophen  (TYLENOL ) 325 MG tablet, Take 650 mg by mouth every 4 (four) hours as needed for mild pain or moderate pain., Disp: , Rfl:    amLODipine  (NORVASC ) 10 MG tablet, Take 1 tablet (10 mg total) by mouth  daily., Disp: 90 tablet, Rfl: 1   apixaban  (ELIQUIS ) 5 MG TABS tablet, Take 1 tablet (5 mg total) by mouth 2 (two) times daily., Disp: 60 tablet, Rfl: 5   FLUoxetine  (PROZAC ) 40 MG capsule, Take 40 mg by mouth daily., Disp: , Rfl:    guaifenesin (ROBITUSSIN) 100 MG/5ML syrup, Take 300 mg by mouth every 4 (four) hours as needed for cough., Disp: ,  Rfl:    hydrocortisone (ANUSOL-HC) 25 MG suppository, Place 25 mg rectally every 6 (six) hours as needed for hemorrhoids or anal itching., Disp: , Rfl:    magnesium  hydroxide (MILK OF MAGNESIA) 400 MG/5ML suspension, Take 30 mLs by mouth daily as needed for mild constipation or moderate constipation., Disp: , Rfl:    metFORMIN  (GLUCOPHAGE ) 1000 MG tablet, Take 1,000 mg by mouth 2 (two) times daily with a meal., Disp: , Rfl:    metoprolol  succinate (TOPROL -XL) 25 MG 24 hr tablet, Take 1 tablet (25 mg total) by mouth daily., Disp: 90 tablet, Rfl: 1   ondansetron  (ZOFRAN ) 4 MG tablet, Take 4 mg by mouth every 4 (four) hours as needed for nausea or vomiting., Disp: , Rfl:    oxyCODONE  (OXY IR/ROXICODONE ) 5 MG immediate release tablet, Take 1 tablet (5 mg total) by mouth every 6 (six) hours as needed for severe pain., Disp: 10 tablet, Rfl: 0   pantoprazole  (PROTONIX ) 40 MG tablet, Take 40 mg by mouth daily., Disp: , Rfl:   Vitals   Vitals:   June 15, 2023 2200 06/15/23 2253  BP:  (!) 164/62  Pulse:  81  Resp:  14  Temp:  97.6 F (36.4 C)  TempSrc:  Oral  SpO2:  97%  Weight: 79 kg     Body mass index is 28.98 kg/m.  Physical Exam  GENERAL: WD, WN, NAD HEENT: - Normocephalic and atraumatic, dry mm, no LN++, no Thyromegally LUNGS - Clear to auscultation bilaterally with no wheezes CV - S1S2 RRR, no m/r/g, equal pulses bilaterally. ABDOMEN - Soft, nontender, nondistended with normoactive BS NEURO:  Mental Status: awake alert and oriented to self. Could not tell me where she is or the date. Got her age correct. Speech and Language: speech is mildly  dysarthric, also has trouble naming (inconsistently naming objects correctly) Cranial Nerves: Pupils are equal round react light, seems to have a rightward gaze preference but blinks to threat from both sides.  Face appears symmetric.  Tongue and palate midline.  Later on when I examined her she had no restriction in looking to the left although initially it appeared that she had a rightward gaze preference Motor: Right upper and lower extremity are antigravity without much drift.  Left upper and lower extremity have a mild drift.  She also seems to be lifting the right arm every time you ask her to lift the left arm and lifts the right leg when asked to lift the left leg. Tone: is normal and bulk is normal Sensation-appears intact to light touch and noxious stimulation but appears to neglect the left side and double simultaneous stimulation Coordination: No gross dysmetria Gait- deferred  Labs/Imaging/Neurodiagnostic studies   CBC:  Recent Labs  Lab 06/15/2023 2237  WBC 8.4  NEUTROABS 5.7  HGB 11.1*  12.2  HCT 36.8  36.0  MCV 84.8  PLT 153   Basic Metabolic Panel:  Lab Results  Component Value Date   NA 140 Jun 15, 2023   K 3.7 15-Jun-2023   CO2 29 12/06/2022   GLUCOSE 123 (H) June 15, 2023   BUN 12 Jun 15, 2023   CREATININE 0.70 15-Jun-2023   CALCIUM  8.2 (L) 12/06/2022   GFRNONAA >60 12/06/2022   GFRAA >60 09/16/2016   Lipid Panel: No results found for: LDLCALC HgbA1c:  Lab Results  Component Value Date   HGBA1C 5.9 (H) 12/02/2022   INR  Lab Results  Component Value Date   INR 1.2 06-15-23   APTT  Lab Results  Component Value  Date   APTT 30 05/29/2023   CT Head without contrast(Personally reviewed): Aspects 10.  No bleed  CT angio Head and Neck with contrast(Personally reviewed): No ELVO   ASSESSMENT   LYLIANA DICENSO is a 83 y.o. female with hx of atrial fibrillation on Eliquis  last dose this evening, hypertension, hyperlipidemia, obesity, chronic back pain  and multiple back surgeries, knee osteomyelitis, impaired ambulation uses wheelchair for ambulation diabetes, diabetic neuropathy, depression, presented to the emergency department from her nursing home for evaluation of speech difficulty and left-sided weakness. On examination she did have some left-sided weakness and almost left-sided neglect with some rightward gaze preference. Family provides history of frequent UTIs. She is on Eliquis -not a candidate for thrombolysis. CTA head and neck did not reveal any LVO  At this time I suspect either small cortical infarcts versus an underlying infection that is causing encephalopathy to be the cause of her current presentation as she does have significantly diminished attention concentration as well.  RECOMMENDATIONS  Check for infectious etiology-UA, chest x-ray Stat MRI of the brain. If the MRI is positive for stroke, then only pursue stroke risk factor workup that should include 2D echo, A1c, lipid panel. Blood pressure parameters will also depend on the MRI findings.  If there is a stroke, allow for permissive hypertension up to 220 systolic otherwise the blood pressure goals would be normotension. This plan was discussed with Abundio Eng, PA-C from the ED. Neurology will follow up the results with you.  ______________________________________________________________________    Signed, Eligio Lav, MD [facility]

## 2023-05-29 NOTE — Progress Notes (Addendum)
 Same-day progress note UA consistent with UTI. I would still recommend getting an MRI. I would recommend treating the UTI but avoiding cefepime as it can cause CNS toxicity. If the MRI is positive for stroke, will recommend full stroke workup, otherwise inpatient neurology will be available as needed.   ADDENDUM MR brain completed & reviewed. No evidence of acute stroke. Bilateral ventricular susceptibility artifact in the occipital horns of the lateral ventricle with no evidence of acute bleed. Likely chronic hemosiderin deposition. Consider EEG since she was focal without any evidence of structural abnormality. Call neurology back for recs if EEG is abnormal or if she is not returning to baseline with treatment of toxic metabolic derangements.   -- Eligio Lav, MD Neurologist [facility]  No charge note

## 2023-05-30 ENCOUNTER — Inpatient Hospital Stay (HOSPITAL_COMMUNITY): Payer: Medicare PPO

## 2023-05-30 DIAGNOSIS — D509 Iron deficiency anemia, unspecified: Secondary | ICD-10-CM | POA: Diagnosis present

## 2023-05-30 DIAGNOSIS — G9340 Encephalopathy, unspecified: Secondary | ICD-10-CM | POA: Diagnosis present

## 2023-05-30 DIAGNOSIS — G9341 Metabolic encephalopathy: Secondary | ICD-10-CM | POA: Diagnosis present

## 2023-05-30 DIAGNOSIS — Z7984 Long term (current) use of oral hypoglycemic drugs: Secondary | ICD-10-CM | POA: Diagnosis not present

## 2023-05-30 DIAGNOSIS — N3001 Acute cystitis with hematuria: Secondary | ICD-10-CM | POA: Diagnosis present

## 2023-05-30 DIAGNOSIS — F32A Depression, unspecified: Secondary | ICD-10-CM | POA: Diagnosis present

## 2023-05-30 DIAGNOSIS — N3000 Acute cystitis without hematuria: Secondary | ICD-10-CM | POA: Diagnosis not present

## 2023-05-30 DIAGNOSIS — E870 Hyperosmolality and hypernatremia: Secondary | ICD-10-CM | POA: Diagnosis present

## 2023-05-30 DIAGNOSIS — R569 Unspecified convulsions: Secondary | ICD-10-CM | POA: Diagnosis not present

## 2023-05-30 DIAGNOSIS — J45909 Unspecified asthma, uncomplicated: Secondary | ICD-10-CM | POA: Diagnosis present

## 2023-05-30 DIAGNOSIS — Z66 Do not resuscitate: Secondary | ICD-10-CM | POA: Diagnosis present

## 2023-05-30 DIAGNOSIS — E039 Hypothyroidism, unspecified: Secondary | ICD-10-CM | POA: Diagnosis present

## 2023-05-30 DIAGNOSIS — N390 Urinary tract infection, site not specified: Secondary | ICD-10-CM | POA: Diagnosis not present

## 2023-05-30 DIAGNOSIS — E1140 Type 2 diabetes mellitus with diabetic neuropathy, unspecified: Secondary | ICD-10-CM | POA: Diagnosis present

## 2023-05-30 DIAGNOSIS — B9620 Unspecified Escherichia coli [E. coli] as the cause of diseases classified elsewhere: Secondary | ICD-10-CM | POA: Diagnosis present

## 2023-05-30 DIAGNOSIS — D75839 Thrombocytosis, unspecified: Secondary | ICD-10-CM | POA: Diagnosis present

## 2023-05-30 DIAGNOSIS — Z1612 Extended spectrum beta lactamase (ESBL) resistance: Secondary | ICD-10-CM | POA: Diagnosis present

## 2023-05-30 DIAGNOSIS — I480 Paroxysmal atrial fibrillation: Secondary | ICD-10-CM | POA: Diagnosis present

## 2023-05-30 DIAGNOSIS — E871 Hypo-osmolality and hyponatremia: Secondary | ICD-10-CM | POA: Diagnosis present

## 2023-05-30 DIAGNOSIS — I447 Left bundle-branch block, unspecified: Secondary | ICD-10-CM | POA: Diagnosis present

## 2023-05-30 DIAGNOSIS — E8809 Other disorders of plasma-protein metabolism, not elsewhere classified: Secondary | ICD-10-CM | POA: Diagnosis present

## 2023-05-30 DIAGNOSIS — Z7901 Long term (current) use of anticoagulants: Secondary | ICD-10-CM | POA: Diagnosis not present

## 2023-05-30 DIAGNOSIS — E872 Acidosis, unspecified: Secondary | ICD-10-CM | POA: Diagnosis present

## 2023-05-30 DIAGNOSIS — E1165 Type 2 diabetes mellitus with hyperglycemia: Secondary | ICD-10-CM | POA: Diagnosis present

## 2023-05-30 DIAGNOSIS — E669 Obesity, unspecified: Secondary | ICD-10-CM | POA: Diagnosis present

## 2023-05-30 DIAGNOSIS — I10 Essential (primary) hypertension: Secondary | ICD-10-CM | POA: Diagnosis present

## 2023-05-30 DIAGNOSIS — E7800 Pure hypercholesterolemia, unspecified: Secondary | ICD-10-CM | POA: Diagnosis present

## 2023-05-30 LAB — BASIC METABOLIC PANEL
Anion gap: 18 — ABNORMAL HIGH (ref 5–15)
BUN: 10 mg/dL (ref 8–23)
CO2: 26 mmol/L (ref 22–32)
Calcium: 8.8 mg/dL — ABNORMAL LOW (ref 8.9–10.3)
Chloride: 96 mmol/L — ABNORMAL LOW (ref 98–111)
Creatinine, Ser: 0.58 mg/dL (ref 0.44–1.00)
GFR, Estimated: >60 mL/min (ref >60)
Glucose, Bld: 120 mg/dL — ABNORMAL HIGH (ref 70–99)
Potassium: 4.1 mmol/L (ref 3.5–5.1)
Sodium: 140 mmol/L (ref 135–145)

## 2023-05-30 LAB — GLUCOSE, CAPILLARY: Glucose-Capillary: 107 mg/dL — ABNORMAL HIGH (ref 70–99)

## 2023-05-30 LAB — CBC
HCT: 32.7 % — ABNORMAL LOW (ref 36.0–46.0)
Hemoglobin: 10 g/dL — ABNORMAL LOW (ref 12.0–15.0)
MCH: 25.6 pg — ABNORMAL LOW (ref 26.0–34.0)
MCHC: 30.6 g/dL (ref 30.0–36.0)
MCV: 83.6 fL (ref 80.0–100.0)
Platelets: 125 10*3/uL — ABNORMAL LOW (ref 150–400)
RBC: 3.91 MIL/uL (ref 3.87–5.11)
RDW: 15.9 % — ABNORMAL HIGH (ref 11.5–15.5)
WBC: 7.3 10*3/uL (ref 4.0–10.5)
nRBC: 0 % (ref 0.0–0.2)

## 2023-05-30 LAB — AMMONIA: Ammonia: 17 umol/L (ref 9–35)

## 2023-05-30 LAB — CBG MONITORING, ED
Glucose-Capillary: 115 mg/dL — ABNORMAL HIGH (ref 70–99)
Glucose-Capillary: 112 mg/dL — ABNORMAL HIGH (ref 70–99)

## 2023-05-30 LAB — MAGNESIUM: Magnesium: 1.2 mg/dL — ABNORMAL LOW (ref 1.7–2.4)

## 2023-05-30 LAB — PHOSPHORUS: Phosphorus: 2.5 mg/dL (ref 2.5–4.6)

## 2023-05-30 MED ORDER — MAGNESIUM OXIDE -MG SUPPLEMENT 400 (240 MG) MG PO TABS
400.0000 mg | ORAL_TABLET | Freq: Every day | ORAL | Status: DC
  Administered 2023-05-30 – 2023-06-01 (×3): 400 mg via ORAL
  Filled 2023-05-30 (×3): qty 1

## 2023-05-30 MED ORDER — ACETAMINOPHEN 325 MG PO TABS: 650.0000 mg | ORAL_TABLET | Freq: Four times a day (QID) | ORAL | Status: DC | PRN

## 2023-05-30 MED ORDER — METOPROLOL SUCCINATE ER 25 MG PO TB24
25.0000 mg | ORAL_TABLET | Freq: Every day | ORAL | Status: DC
  Administered 2023-05-30 – 2023-06-06 (×8): 25 mg via ORAL
  Filled 2023-05-30 (×8): qty 1

## 2023-05-30 MED ORDER — ACETAMINOPHEN 650 MG RE SUPP: 650.0000 mg | Freq: Four times a day (QID) | RECTAL | Status: DC | PRN

## 2023-05-30 MED ORDER — FLUOXETINE HCL 20 MG PO CAPS
20.0000 mg | ORAL_CAPSULE | Freq: Every day | ORAL | Status: DC
  Administered 2023-05-30 – 2023-06-06 (×8): 20 mg via ORAL
  Filled 2023-05-30 (×8): qty 1

## 2023-05-30 MED ORDER — SODIUM CHLORIDE 0.9 % IV SOLN: INTRAVENOUS | Status: AC

## 2023-05-30 MED ORDER — AMLODIPINE BESYLATE 5 MG PO TABS
10.0000 mg | ORAL_TABLET | Freq: Every day | ORAL | Status: DC
  Administered 2023-05-30 – 2023-06-06 (×8): 10 mg via ORAL
  Filled 2023-05-30 (×8): qty 2

## 2023-05-30 MED ORDER — SENNOSIDES-DOCUSATE SODIUM 8.6-50 MG PO TABS
1.0000 | ORAL_TABLET | Freq: Every evening | ORAL | Status: DC | PRN
  Administered 2023-06-05: 1 via ORAL
  Filled 2023-05-30: qty 1

## 2023-05-30 MED ORDER — INSULIN ASPART 100 UNIT/ML IJ SOLN
0.0000 [IU] | Freq: Three times a day (TID) | INTRAMUSCULAR | Status: DC
  Administered 2023-05-31: 3 [IU] via SUBCUTANEOUS
  Administered 2023-05-31: 2 [IU] via SUBCUTANEOUS
  Administered 2023-06-01 – 2023-06-02 (×2): 3 [IU] via SUBCUTANEOUS
  Administered 2023-06-03 – 2023-06-04 (×3): 2 [IU] via SUBCUTANEOUS
  Administered 2023-06-04: 3 [IU] via SUBCUTANEOUS
  Administered 2023-06-04 – 2023-06-05 (×3): 2 [IU] via SUBCUTANEOUS
  Administered 2023-06-05: 5 [IU] via SUBCUTANEOUS
  Administered 2023-06-06 (×2): 2 [IU] via SUBCUTANEOUS

## 2023-05-30 MED ORDER — MAGNESIUM SULFATE 4 GM/100ML IV SOLN
4.0000 g | Freq: Once | INTRAVENOUS | Status: AC
  Administered 2023-05-30: 4 g via INTRAVENOUS
  Filled 2023-05-30: qty 100

## 2023-05-30 MED ORDER — APIXABAN 5 MG PO TABS
5.0000 mg | ORAL_TABLET | Freq: Two times a day (BID) | ORAL | Status: DC
Start: 2023-05-30 — End: 2023-06-06
  Administered 2023-05-30 – 2023-06-06 (×15): 5 mg via ORAL
  Filled 2023-05-30 (×11): qty 1
  Filled 2023-05-30: qty 2
  Filled 2023-05-30 (×3): qty 1

## 2023-05-30 MED ORDER — ENSURE ENLIVE PO LIQD
237.0000 mL | Freq: Two times a day (BID) | ORAL | Status: DC
  Administered 2023-05-30 – 2023-06-06 (×9): 237 mL via ORAL

## 2023-05-30 NOTE — Procedures (Signed)
 Patient Name: Jeanette [name]  MRN: [id number]  Epilepsy Attending: Arleene Lack  Referring Physician/Provider: Vita Grip, MD  Date: 05/30/2023 Duration: 23.11 mins  Patient history: 83yo F with speech difficulty and left-sided weakness. EEG to evaluate for seizure  Level of alertness: Awake  AEDs during EEG study: None  Technical aspects: This EEG study was done with scalp electrodes positioned according to the 10-20 International system of electrode placement. Electrical activity was reviewed with band pass filter of 1-70Hz , sensitivity of 7 uV/mm, display speed of 9mm/sec with a 60Hz  notched filter applied as appropriate. EEG data were recorded continuously and digitally stored.  Video monitoring was available and reviewed as appropriate.  Description: EEG showed continuous generalized and lateralized right hemisphere predominantly 5 to 6 Hz theta slowing admixed with intermittent 2-3Hz  delta slowing. Hyperventilation and photic stimulation were not performed.     ABNORMALITY - Continuous slow, generalized and lateralized right hemisphere   IMPRESSION: This study is suggestive of cortical dysfunction arising from right hemisphere likely secondary to underlying structural abnormality. Additionally there is moderate diffuse encephalopathy. No seizures or epileptiform discharges were seen throughout the recording.  Amellia Panik O Lavalle Skoda

## 2023-05-30 NOTE — Evaluation (Signed)
 Physical Therapy Evaluation Patient Details Name: Jeanette [name] MRN: [id number] DOB: [birth year] Today's Date: 05/30/2023  History of Present Illness  Pt is an 83yo female who was brought to ED from SNF with AMS and as code stroke. Pt with L sided weakness and R gaze preference. Imaging revealed no acute intracranial abnormality. PMH: A-fib on Eliquis , HTN, HLD, obesity, chronic back pain, T2DM with diabetic neuropathy, asthma, DDD, UTIs, vertebral osteomyelitis   Clinical Impression  Pt admitted with AMS from SNF. At this time pt requiring modA for transfers and is able to take a few steps along ED stretcher. Unsure of patients baseline level of cognition however pt demonstrates delayed processing and memory deficits in addition to mild confusion. Once medically stable pt remains appropriate to return to SNF for continued therapy as pt stated she lived by herself in [place] until a few months ago. Acute PT to cont to follow.        If plan is discharge home, recommend the following: A lot of help with bathing/dressing/bathroom;Direct supervision/assist for financial management;A little help with walking and/or transfers;A lot of help with walking and/or transfers   Can travel by private vehicle   Yes    Equipment Recommendations None recommended by PT (has DME at [facility])  Recommendations for Other Services       Functional Status Assessment Patient has had a recent decline in their functional status and demonstrates the ability to make significant improvements in function in a reasonable and predictable amount of time.     Precautions / Restrictions Precautions Precautions: Fall Precaution Comments: urinary in continence Restrictions Weight Bearing Restrictions Per Provider Order: No      Mobility  Bed Mobility Overal bed mobility: Needs Assistance Bed Mobility: Supine to Sit, Sit to Supine     Supine to sit: Mod assist Sit to supine: Mod assist   General bed mobility  comments: HOB elevated, pt able to initiate LE off edge of stretcher, modA for trunk elevation and to scoot to edge, modA for LE management back up/onto stretch    Transfers Overall transfer level: Needs assistance Equipment used: 1 person hand held assist Transfers: Sit to/from Stand Sit to Stand: Min assist           General transfer comment: minA to power up, completed 3 sit to stands    Ambulation/Gait               General Gait Details: limited to side stepping along stretcher, 2 bouts of 4 short steps to the R. pt with difficulty clearing L foot from floor  Stairs            Wheelchair Mobility     Tilt Bed    Modified Rankin (Stroke Patients Only)       Balance Overall balance assessment: Needs assistance Sitting-balance support: Feet supported, Bilateral upper extremity supported Sitting balance-Leahy Scale: Fair     Standing balance support: Single extremity supported, During functional activity Standing balance-Leahy Scale: Poor                               Pertinent Vitals/Pain Pain Assessment Pain Assessment: Faces Faces Pain Scale: No hurt    Home Living Family/patient expects to be discharged to:: Skilled nursing facility     Type of Home:  (SNF per chart review)             Additional Comments: per chart patient from  facility. when asked pt states she lives in a facility however unable to state name of facility.    Prior Function Prior Level of Function : Other (comment)             Mobility Comments: per chart and patient, patient uses w/c as primary mode of mobility ADLs Comments: assist     Extremity/Trunk Assessment   Upper Extremity Assessment Upper Extremity Assessment: Generalized weakness    Lower Extremity Assessment Lower Extremity Assessment: Generalized weakness    Cervical / Trunk Assessment Cervical / Trunk Assessment: Kyphotic  Communication   Communication Communication:  Difficulty communicating thoughts/reduced clarity of speech  Cognition Arousal: Alert Behavior During Therapy: Flat affect Overall Cognitive Status: No family/caregiver present to determine baseline cognitive functioning                                 General Comments: pt with delayed processing/response time, pt able to state month/year with choices, states name and birthdate. stated she was at [facility]. Pt poor historian stating "I get mixed up sometimes", suspect pt with memory deficits at baseline        General Comments General comments (skin integrity, edema, etc.): VSS.    Exercises     Assessment/Plan    PT Assessment Patient needs continued PT services  PT Problem List Decreased strength;Decreased activity tolerance;Decreased balance;Decreased mobility;Decreased coordination       PT Treatment Interventions DME instruction;Gait training;Functional mobility training;Therapeutic activities;Therapeutic exercise;Balance training    PT Goals (Current goals can be found in the Care Plan section)  Acute Rehab PT Goals Patient Stated Goal: didn't state PT Goal Formulation: With patient Time For Goal Achievement: 06/13/23 Potential to Achieve Goals: Good    Frequency Min 1X/week     Co-evaluation               AM-PAC PT "6 Clicks" Mobility  Outcome Measure Help needed turning from your back to your side while in a flat bed without using bedrails?: A Lot Help needed moving from lying on your back to sitting on the side of a flat bed without using bedrails?: A Lot Help needed moving to and from a bed to a chair (including a wheelchair)?: A Lot Help needed standing up from a chair using your arms (e.g., wheelchair or bedside chair)?: A Lot Help needed to walk in hospital room?: A Lot Help needed climbing 3-5 steps with a railing? : Total 6 Click Score: 11    End of Session Equipment Utilized During Treatment: Gait belt Activity  Tolerance: Patient tolerated treatment well Patient left: in bed;with call bell/phone within reach Nurse Communication: Mobility status PT Visit Diagnosis: Unsteadiness on feet (R26.81)    Time: 1610-9604 PT Time Calculation (min) (ACUTE ONLY): 19 min   Charges:   PT Evaluation $PT Eval Low Complexity: 1 Low   PT General Charges $$ ACUTE PT VISIT: 1 Visit         Renaee Caro, PT, DPT Acute Rehabilitation Services Secure chat preferred Office #: [phone]   Jenna Moan 05/30/2023, 2:19 PM

## 2023-05-30 NOTE — Progress Notes (Signed)
 Jeanette [name]    Jeanette [name]  GNF:[id number] DOB: 1941-01-30 DOA: 05/29/2023 PCP: Wayne Haines, MD   Brief Narrative:  The patient is an overweight 83 year old Caucasian female with a past medical history significant for but not limited to atrial fibrillation on anticoagulation with Eliquis , hypertension, hyperlipidemia, chronic back pain, diabetes mellitus type 2 with diabetic neuropathy, asthma, degenerative disc disease history of UTIs and vertebral osteomyelitis and also history of impaired ambulation who is mostly wheelchair-bound presented to the hospital via EMS from SNF as a code stroke.  EMS was called to her facility due to decreased responsiveness, left-sided weakness and right-sided gaze.  Her last known well time is unknown and she was only alert and oriented to self.  Head CT and MRI were done and CT of the head was done and showed no acute intracranial abnormalities and CTA of the head and neck showed no LVO.  Neurology was consulted and MRI showed no acute intracranial abnormality.  EEG was then also done and pending read.  The neurology team felt that her altered mental status was in the setting of a UTI and recommended calling neurology back if EEG was abnormal or she is not returning to baseline with treatment of her toxic metabolic derangements.  Currently she is being admitted and treated for the following but not limited to:  Assessment and Plan:  Acute Metabolic Encephalopathy -Patient presented from SNF as a code stroke due to unresponsiveness, left-sided weakness and right gaze preference.   -Head and neck imaging overall does not show any acute intracranial abnormalities. -Normal ethanol, ammonia and UDS.  -UA shows signs of infection which is the most likely cause of her encephalopathy. -Neurology following, recommends further evaluation with a EEG. -Admit to progressive bed -Neurology consulted, recommends EEG and reconsult if abnormal -Follow-up EEG showed "This  study is suggestive of cortical dysfunction arising from right hemisphere likely secondary to underlying structural abnormality. Additionally there is moderate diffuse encephalopathy. No seizures or epileptiform discharges were seen throughout the recording." -Management of UTI as below -Give IV NS at 100 cc/h for 10 hours until able to eat -PT/OT/SLP eval and treat and recommending SNF -N.p.o. until passed swallow screen but now they are recommending Regular Diet with Thin Liquids -Delirium precautions   Acute UTI Cystitis -Elderly patient with history of UTIs presented with acute encephalopathy and found to have evidence of infection on urinalysis. -Urinalysis done and showed a cloudy appearance with small hemoglobin, large leukocytes, positive nitrites, many bacteria, 21-50 RBCs per high-power field, greater than 50 WBCs; unfortunately there is no urinalysis obtained on admission so obtain 1 now but she received antibiotics already -Continue IV ceftriaxone  -Follow-up urine culture -Continue monitor and trend CBC and fever curve; Current WBC: Recent Labs  Lab 05/29/23 2237 05/30/23 0451  WBC 8.4 7.3   Paroxysmal A-fib -EKG on admission shows sinus rhythm with LBBB. HR stable in the 70s and 80s. -Continue Anticoagulation with Apixaban  5 mg po BID  and Metoprolol  Succinate 25 mg po Daily -C/w Telemetry -Check Mag and Phos  Hypomagnesemia -Patient's Mag Level Trend: Recent Labs  Lab 05/30/23 0451  MG 1.2*  -Replete with IV Mag Sulfate 4 Grams -Continue to Monitor and Replete as Necessary -Repeat Mag in the AM   HTN BP elevated with SBP in the 150s to 160s -Continue amlodipine  and Toprol  XL   Diabetes Mellitus Type 2 On metformin  at home. Last A1c 5.9% 5 months ago. -SSI with meals   Normocytic Anemia -Hgb/Hct  Trend: Recent Labs  Lab 05/29/23 2237 05/30/23 0451  HGB 11.1*  12.2 10.0*  HCT 36.8  36.0 32.7*  MCV 84.8 83.6  -Check Anemia Panel in the AM -Continue to  Monitor for S/Sx of Bleeding; No overt bleeding -Repeat CBC in the AM  Thrombocytosis -Platelet Count Trend: Recent Labs  Lab 05/29/23 2237 05/30/23 0451  PLT 153 125*  -Continue to Monitor and Trend and Repeat CBC in the AM  Hypoalbuminemia -Patient's Albumin Trend: Recent Labs  Lab 05/29/23 2237  ALBUMIN 2.8*  -Continue to Monitor and Trend and repeat CMP in the AM  Overweight -Complicates overall prognosis and care -Estimated body mass index is 28.98 kg/m as calculated from the following:   Height as of 12/02/22: 5\' 5"  (1.651 m).   Weight as of this encounter: 79 kg.  -Weight Loss and Dietary Counseling given   DVT prophylaxis:  apixaban  (ELIQUIS ) tablet 5 mg    Code Status: Limited: Do not attempt resuscitation (DNR) -DNR-LIMITED -Do Not Intubate/DNI  Family Communication: No family present at bedside   Disposition Plan:  Level of care: Progressive Status is: Inpatient Remains inpatient appropriate because: Needs further clinical Improvement    Consultants:  None  Procedures:  As delineated as above  Antimicrobials:  Anti-infectives (From admission, onward)    Start     Dose/Rate Route Frequency Ordered Stop   05/29/23 2345  cefTRIAXone  (ROCEPHIN ) 1 g in sodium chloride  0.9 % 100 mL IVPB        1 g 200 mL/hr over 30 Minutes Intravenous Every 24 hours 05/29/23 2336         Objective: Vitals:   05/30/23 0915 05/30/23 0930 05/30/23 1200 05/30/23 1238  BP: (!) 173/75 (!) 152/68  139/69  Pulse: 77 68  76  Resp: 15 14  16   Temp:   98.5 F (36.9 C)   TempSrc:      SpO2: 96% 91%  92%  Weight:        Intake/Output Summary (Last 24 hours) at 05/30/2023 1649 Last data filed at 05/30/2023 0016 Gross per 24 hour  Intake 100 ml  Output 200 ml  Net -100 ml   Filed Weights   05/29/23 2200  Weight: 79 kg   Data Reviewed: I have personally reviewed following labs and imaging studies  CBC: Recent Labs  Lab 05/29/23 2237 05/30/23 0451  WBC 8.4 7.3   NEUTROABS 5.7  --   HGB 11.1*  12.2 10.0*  HCT 36.8  36.0 32.7*  MCV 84.8 83.6  PLT 153 125*   Basic Metabolic Panel: Recent Labs  Lab 05/29/23 2237 05/30/23 0451  NA 140  140 140  K 3.7  3.7 4.1  CL 100  101 96*  CO2 23 26  GLUCOSE 122*  123* 120*  BUN 12  12 10   CREATININE 0.67  0.70 0.58  CALCIUM  8.9 8.8*  MG  --  1.2*  PHOS  --  2.5   GFR: Estimated Creatinine Clearance: 58.3 mL/min (by C-G formula based on SCr of 0.58 mg/dL). Liver Function Tests: Recent Labs  Lab 05/29/23 2237  AST 23  ALT 13  ALKPHOS 100  BILITOT 0.8  PROT 7.3  ALBUMIN 2.8*   No results for input(s): "LIPASE", "AMYLASE" in the last 168 hours. Recent Labs  Lab 05/29/23 2345  AMMONIA 17   Coagulation Profile: Recent Labs  Lab 05/29/23 2237  INR 1.2   Cardiac Enzymes: No results for input(s): "CKTOTAL", "CKMB", "CKMBINDEX", "TROPONINI" in the last  168 hours. BNP (last 3 results) No results for input(s): "PROBNP" in the last 8760 hours. HbA1C: No results for input(s): "HGBA1C" in the last 72 hours. CBG: Recent Labs  Lab 05/29/23 2233 05/30/23 0732 05/30/23 1212  GLUCAP 111* 115* 112*   Lipid Profile: No results for input(s): "CHOL", "HDL", "LDLCALC", "TRIG", "CHOLHDL", "LDLDIRECT" in the last 72 hours. Thyroid  Function Tests: No results for input(s): "TSH", "T4TOTAL", "FREET4", "T3FREE", "THYROIDAB" in the last 72 hours. Anemia Panel: No results for input(s): "VITAMINB12", "FOLATE", "FERRITIN", "TIBC", "IRON", "RETICCTPCT" in the last 72 hours. Sepsis Labs: No results for input(s): "PROCALCITON", "LATICACIDVEN" in the last 168 hours.  Recent Results (from the past 240 hours)  Urine Culture (for pregnant, neutropenic or urologic patients or patients with an indwelling urinary catheter)     Status: None (Preliminary result)   Collection Time: 05/30/23  9:50 AM   Specimen: In/Out Cath Urine  Result Value Ref Range Status   Specimen Description IN/OUT CATH URINE   Final   Special Requests   Final    NONE Performed at [facility], [address] [place], [place] [address]    Culture PENDING  Incomplete   Report Status PENDING  Incomplete    Radiology Studies: EEG adult Result Date: 05/30/2023 Arleene Lack, MD     05/30/2023 11:03 AM Patient Name: Jeanette [name] MRN: [id number] Epilepsy Attending: Arleene Lack Referring Physician/Provider: Vita Grip, MD Date: 05/30/2023 Duration: 23.11 mins Patient history: 83yo F with speech difficulty and left-sided weakness. EEG to evaluate for seizure Level of alertness: Awake AEDs during EEG study: None Technical aspects: This EEG study was done with scalp electrodes positioned according to the 10-20 International system of electrode placement. Electrical activity was reviewed with band pass filter of 1-70Hz , sensitivity of 7 uV/mm, display speed of 71mm/sec with a 60Hz  notched filter applied as appropriate. EEG data were recorded continuously and digitally stored.  Video monitoring was available and reviewed as appropriate. Description: EEG showed continuous generalized and lateralized right hemisphere predominantly 5 to 6 Hz theta slowing admixed with intermittent 2-3Hz  delta slowing. Hyperventilation and photic stimulation were not performed.   ABNORMALITY - Continuous slow, generalized and lateralized right hemisphere IMPRESSION: This study is suggestive of cortical dysfunction arising from right hemisphere likely secondary to underlying structural abnormality. Additionally there is moderate diffuse encephalopathy. No seizures or epileptiform discharges were seen throughout the recording. Arleene Lack   MR BRAIN WO CONTRAST Result Date: 05/30/2023 CLINICAL DATA:  Initial evaluation for acute neuro deficit, stroke suspected. EXAM: MRI HEAD WITHOUT CONTRAST TECHNIQUE: Multiplanar, multiecho pulse sequences of the brain and surrounding structures were obtained without intravenous contrast.  COMPARISON:  Prior CT from 05/29/2023 as well as previous MRI from 09/14/2016. FINDINGS: Brain: Mild age-related cerebral atrophy. Patchy T2/FLAIR hyperintensity involving the periventricular deep white matter as well as the pons, consistent with chronic microvascular ischemic disease, moderately advanced in nature. Remote lacunar infarct present at the right thalamus. Small remote right cerebellar infarct noted. No evidence for acute or subacute infarct. Gray-white matter differentiation maintained. No acute intracranial hemorrhage. Few scattered punctate chronic micro hemorrhages noted. Additionally, [name] is made of small foci of susceptibility artifact within the occipital horns of both lateral ventricles (series 12, image 26). No signal changes seen at this location on corresponding sequences to suggest acute hemorrhage. No visible intraventricular hemorrhage seen on prior CT. Given this, finding is felt to be chronic in nature, although this appears to be new as compared  to prior MRI from 2018. No mass lesion, midline shift or mass effect. No hydrocephalus or extra-axial fluid collection. Pituitary gland suprasellar region within normal limits. Vascular: Major intracranial vascular flow voids are maintained. Skull and upper cervical spine: Craniocervical junction with normal limits. Bone marrow signal intensity normal. No scalp soft tissue abnormality. Sinuses/Orbits: Globes orbital soft tissues within normal limits. Paranasal sinuses are largely clear. No mastoid effusion. Other: None. IMPRESSION: 1. No acute intracranial abnormality. 2. Age-related cerebral atrophy with moderate chronic microvascular ischemic disease, with a few small remote infarcts involving the right thalamus and right cerebellum. 3. Small foci of susceptibility artifact within the occipital horns of both lateral ventricles, felt to be chronic in nature, although this appears to be new as compared to prior MRI from 2018. Electronically  Signed   By: Virgia Griffins M.D.   On: 05/30/2023 01:39   CT ANGIO HEAD NECK W WO CM (CODE STROKE) Result Date: 05/29/2023 CLINICAL DATA:  Initial evaluation for acute neuro deficit, stroke suspected, left-sided weakness, right gaze, aphasia. EXAM: CT ANGIOGRAPHY HEAD AND NECK WITH AND WITHOUT CONTRAST TECHNIQUE: Multidetector CT imaging of the head and neck was performed using the standard protocol during bolus administration of intravenous contrast. Multiplanar CT image reconstructions and MIPs were obtained to evaluate the vascular anatomy. Carotid stenosis measurements (when applicable) are obtained utilizing NASCET criteria, using the distal internal carotid diameter as the denominator. RADIATION DOSE REDUCTION: This exam was performed according to the departmental dose-optimization program which includes automated exposure control, adjustment of the mA and/or kV according to patient size and/or use of iterative reconstruction technique. CONTRAST:  75mL OMNIPAQUE  IOHEXOL  350 MG/ML SOLN COMPARISON:  CT from earlier the same day. FINDINGS: CTA NECK FINDINGS Aortic arch: Visualized aortic arch within normal limits for caliber with standard branch pattern. Moderately advanced atheromatous change about the arch itself. Associated short-segment 65% stenosis at the proximal left subclavian artery (series 6, image 58). Right carotid system: Right common and internal carotid arteries are tortuous but patent without dissection. Calcified plaque about the right carotid bulb without hemodynamically significant greater than 50% stenosis. Left carotid system: Left common and internal carotid arteries are tortuous but patent without dissection. Calcified plaque about the left carotid bulb without hemodynamically significant greater than 50% stenosis. Irregularity about the mid-distal cervical left ICA, suggestive of FMD. Vertebral arteries: Left vertebral artery arises directly from the aortic arch. Atheromatous  change at the origins of both vertebral arteries with up to moderate stenosis on the left (series 6, image 55). Vertebral arteries otherwise patent distally without significant stenosis or dissection. Skeleton: No discrete or worrisome osseous lesions. Mild chronic height loss noted at the superior endplates of T1 and T2. Advanced osteoarthritic changes noted about the C1-2 articulations. Moderate to advanced spondylosis at C5-6 and C6-7. Other neck: No other acute finding.  1.8 cm left thyroid  nodule. Upper chest: Visualized upper chest demonstrates no acute finding. Review of the MIP images confirms the above findings CTA HEAD FINDINGS Anterior circulation: Atheromatous change about the carotid siphons without hemodynamically significant stenosis. A1 segments patent bilaterally. Normal anterior communicating artery complex. Anterior cerebral arteries patent without stenosis. No M1 stenosis or occlusion. Distal MCA branches perfused and symmetric. Posterior circulation: Both V4 segments patent without significant stenosis. Both PICA patent at their origins. Basilar patent without stenosis. Superior cerebral arteries patent bilaterally. Left PCA supplied via the basilar. Fetal type origin of the right PCA. Atheromatous irregularity about the left PCA with associated mild to moderate left P2  stenoses (series 10, image 19). PCAs otherwise widely patent to their distal aspects. Venous sinuses: Patent allowing for timing the contrast bolus. Anatomic variants: As above.  No aneurysm. Review of the MIP images confirms the above findings IMPRESSION: 1. Negative CTA for large vessel occlusion or other emergent finding. 2. Atheromatous change about the carotid bifurcations and carotid siphons without hemodynamically significant greater than 50% stenosis. Mild-to-moderate left P2 stenoses as above. 3. Irregularity about the mid-distal cervical left ICA, suggestive of FMD. 4. Atheromatous change at the origins of both  vertebral arteries with up to moderate stenosis on the left. Left vertebral artery arises directly from the aortic arch. 5. Short-segment 65% stenosis at the proximal left subclavian artery. 6.  Aortic Atherosclerosis (ICD10-I70.0). 7. 1.8 cm left thyroid  nodule. Further evaluation with dedicated thyroid  ultrasound recommended (ref: J Am Coll Radiol. 2015 Feb;12(2): 143-50). These results were communicated to Dr. Arora at 10:55 pm on 05/29/2023 by text page via the [facility] messaging system. Electronically Signed   By: Virgia Griffins M.D.   On: 05/29/2023 23:12   CT HEAD CODE STROKE WO CONTRAST Result Date: 05/29/2023 CLINICAL DATA:  Code stroke. Initial evaluation for acute neuro deficit, stroke suspected. EXAM: CT HEAD WITHOUT CONTRAST TECHNIQUE: Contiguous axial images were obtained from the base of the skull through the vertex without intravenous contrast. RADIATION DOSE REDUCTION: This exam was performed according to the departmental dose-optimization program which includes automated exposure control, adjustment of the mA and/or kV according to patient size and/or use of iterative reconstruction technique. COMPARISON:  Prior study from 09/14/2016 FINDINGS: Brain: Age-related cerebral atrophy with moderate chronic microischemic disease. No acute intracranial hemorrhage. No acute large vessel territory infarct. No mass lesion or midline shift. No hydrocephalus or extra-axial fluid collection. Vascular: No abnormal hyperdense vessel. Calcified atherosclerosis present at the skull base. Skull: Scalp soft tissues within normal limits for calvarium intact. Sinuses/Orbits: Globes and orbital soft tissues demonstrate no acute finding. Paranasal sinuses and mastoid air cells are clear. Other: None. ASPECTS [facility] Stroke Program Early CT Score) - Ganglionic level infarction (caudate, lentiform nuclei, internal capsule, insula, M1-M3 cortex): 7 - Supraganglionic infarction (M4-M6 cortex): 3 Total score (0-10 with 10  being normal): 10 IMPRESSION: 1. No acute intracranial abnormality. 2. ASPECTS is 10. 3. Age-related cerebral atrophy with moderate chronic microvascular ischemic disease. These results were communicated to Dr. Arora at 10:48 pm on 05/29/2023 by text page via the [facility] messaging system. Electronically Signed   By: Virgia Griffins M.D.   On: 05/29/2023 22:49   Scheduled Meds:  amLODipine   10 mg Oral Daily   apixaban   5 mg Oral BID   feeding supplement  237 mL Oral BID BM   FLUoxetine   20 mg Oral Daily   insulin  aspart  0-15 Units Subcutaneous TID WC   magnesium  oxide  400 mg Oral Daily   metoprolol  succinate  25 mg Oral Daily   Continuous Infusions:  cefTRIAXone  (ROCEPHIN )  IV Stopped (05/30/23 0016)    LOS: 0 days   Aura Leeds, DO [facility] Available via Epic secure chat 7am-7pm After these hours, please refer to coverage provider listed on [email or web address] 05/30/2023, 4:49 PM

## 2023-05-30 NOTE — Progress Notes (Signed)
 EEG complete - results pending

## 2023-05-30 NOTE — Plan of Care (Signed)
 New admission for AMS and code stroke. AxOx2, room air, cardiac monitor. Bed alarm placed, bed in lowest position.   Problem: Coping: Goal: Ability to adjust to condition or change in health will improve Outcome: Not Met (add Reason)   Problem: Education: Goal: Individualized Educational Video(s) Outcome: Not Met (add Reason)   Problem: Skin Integrity: Goal: Risk for impaired skin integrity will decrease Outcome: Not Met (add Reason)   Problem: Tissue Perfusion: Goal: Adequacy of tissue perfusion will improve Outcome: Not Met (add Reason)   Problem: Elimination: Goal: Will not experience complications related to bowel motility Outcome: Not Met (add Reason) Goal: Will not experience complications related to urinary retention Outcome: Not Met (add Reason)   Problem: Pain Managment: Goal: General experience of comfort will improve and/or be controlled Outcome: Not Met (add Reason)   Problem: Ischemic Stroke/TIA Tissue Perfusion: Goal: Complications of ischemic stroke/TIA will be minimized Outcome: Not Met (add Reason)

## 2023-05-30 NOTE — Evaluation (Addendum)
 Speech Language Pathology Evaluation Patient Details Name: JAXON GILLES MRN: [id number] DOB: [birth year] Today's Date: 05/30/2023 Time: 4098-1191 SLP Time Calculation (min) (ACUTE ONLY): 18 min  Problem List:  Patient Active Problem List   Diagnosis Date Noted   Acute encephalopathy 05/30/2023   Acute cystitis with hematuria 05/30/2023   Acute parotitis 12/02/2022   Thyroid  nodule 12/02/2022   Hypomagnesemia 12/02/2022   QT prolongation 12/02/2022   Malignant neoplasm of liver (HCC) 06/08/2022   Urinary tract infection without hematuria 05/12/2022   Recurrent UTI 04/26/2022   Liver lesion 04/26/2022   Liver abscess 03/29/2022   Debility 03/29/2022   Aortic stenosis, mild 03/16/2021   Mitral regurgitation 03/16/2021   Osteoarthritis    Obesity    Hyperlipemia    High cholesterol    Diabetic neuropathy (HCC)    Depressive disorder    Colon polyp    Asthma    Aortic atherosclerosis (HCC)    Allergic rhinitis    History of total left knee replacement 05/12/2018   Infected orthopedic implant, subsequent encounter 05/12/2018   History of lumbar fusion 12/06/2017   Impaired ambulation 11/14/2017   Anemia 11/11/2017   Vertebral osteomyelitis (HCC) 11/10/2017   History of surgical site infection 08/10/2017   Arthralgia of hip 03/01/2017   Lumbar degenerative disc disease 03/01/2017   Lumbar radiculopathy 03/01/2017   Encephalopathy 09/13/2016   DM2 (diabetes mellitus, type 2) (HCC) 09/13/2016   HTN (hypertension) 09/13/2016   Abscess of lower back 09/13/2016   Fat necrosis of skin 09/13/2016   Respiratory failure, acute (HCC) 08/29/2016   Past Medical History:  Past Medical History:  Diagnosis Date   Abscess of lower back 09/13/2016   Allergic rhinitis    Anemia 11/11/2017   Last Assessment & Plan:  Formatting of this note is different from the original. Lab Results  Component Value Date   WBC 5.8 11/16/2017   HGB 8.9 (L) 11/16/2017   HCT 27.9 (L) 11/16/2017   MCV 78.1  (L) 11/16/2017   PLT 264 11/16/2017   -Transfuse hemoglobin less than 7 - Continue iron supplement   Aortic atherosclerosis (HCC)    Arthralgia of hip 03/01/2017   Asthma    Colon polyp    Depressive disorder    Diabetic neuropathy (HCC)    DM2 (diabetes mellitus, type 2) (HCC) 09/13/2016   Encephalopathy 09/13/2016   Fat necrosis of skin 09/13/2016   High cholesterol    History of lumbar fusion 12/06/2017   History of surgical site infection 08/10/2017   Formatting of this note might be different from the original. Added automatically from request for surgery [id number]   History of total left knee replacement 05/12/2018   HTN (hypertension) 09/13/2016   Hyperlipemia    Impaired ambulation 11/14/2017   Last Assessment & Plan:  Formatting of this note might be different from the original. PT eval PT D/C Recs: 24/7 s/PRN a and HHPT. If appropriate assistance not available at home, rec inpt post acute therapy/low intensity prior to D/C home.   Infected orthopedic implant, subsequent encounter 05/12/2018   Lumbar degenerative disc disease 03/01/2017   Lumbar radiculopathy 03/01/2017   Obesity    Osteoarthritis    Respiratory failure, acute (HCC) 08/29/2016   Vertebral osteomyelitis (HCC) 11/10/2017   Last Assessment & Plan:  Formatting of this note might be different from the original. Osteomyelitis at L5 and T12 seen on MRI on 7/25  PLAN  -  OSH cultures growing Staph Ludgunensis, oxacillin susceptible. ABX adjusted ID  consulted, appreciate recs  -  Oxacillin and Rifampin  until 9/15  Ortho consulted - concerned for acute OM - no surgical intervention - IV abx x 6 weeks   IR consulted  - CT gu   Past Surgical History:  Past Surgical History:  Procedure Laterality Date   ABDOMINAL HYSTERECTOMY     APPENDECTOMY     BACK SURGERY     CARDIAC CATHETERIZATION     Normal in 2002 and 2009   CHOLECYSTECTOMY     KNEE SURGERY Bilateral    SHOULDER SURGERY     SPINE SURGERY     HPI:  DORINDA AYOUB is  a 83 y.o. female  who presented via EMS from SNF as a code stroke. MRI 2/10 with no acute findings. Pt with medical history significant for A-fib on Eliquis , HTN, HLD, obesity, chronic back pain, T2DM with diabetic neuropathy, asthma, DDD, UTIs, vertebral osteomyelitis and impaired ambulation mostly wheelchair-bound   Assessment / Plan / Recommendation Clinical Impression  Pt presents with very mild deficits to expressive and receptive language.  She was assessed using the Western Aphasia Battery - Bedside Form.  Bedside aphasia score was 79%.  Pt exhibited difficulty with picture description task and some confusion.  She was unable to state her present age or where she lives.  Her processing was slowed and she benefited from additional response time and occasional repetition.  Pt's speech is clear without any appreciable dysarthria.  MRI negative and suspect deficits may be 2/2 to UTI.  Pt has no hx dementia documented.  SLP will follow for improvement with treatment.  If pt has not returned to baseline, recommend further assessment of cognitive function.  Western Aphasia Battery - Bedside Form Content: 6/10 Fluency: 9/10 Yes/No: 9/10 Sequential Commands: 7/10 Repetition: 8.5/10 Naming: 8/10 Bedside Aphasia Score: 79.16/100      SLP Assessment  SLP Recommendation/Assessment: Patient needs continued Speech Lanaguage Pathology Services    Recommendations for follow up therapy are one component of a multi-disciplinary discharge planning process, led by the attending physician.  Recommendations may be updated based on patient status, additional functional criteria and insurance authorization.    Follow Up Recommendations  Skilled nursing-short term rehab (<3 hours/day)    Assistance Recommended at Discharge   TBD   Functional Status Assessment Patient has had a recent decline in their functional status and demonstrates the ability to make significant improvements in function in a reasonable  and predictable amount of time.  Frequency and Duration min 2x/week  2 weeks      SLP Evaluation Cognition  Overall Cognitive Status: Difficult to assess       Comprehension  Auditory Comprehension Overall Auditory Comprehension: Appears within functional limits for tasks assessed Yes/No Questions: Within Functional Limits Commands: Impaired Multistep Basic Commands: 50-74% accurate Conversation: Simple Interfering Components: Processing speed EffectiveTechniques: Extra processing time;Repetition Visual Recognition/Discrimination Discrimination: Not tested Reading Comprehension Reading Status: Not tested    Expression Expression Primary Mode of Expression: Verbal Verbal Expression Overall Verbal Expression: Appears within functional limits for tasks assessed Level of Generative/Spontaneous Verbalization: Sentence Repetition: Impaired Level of Impairment: Sentence level Naming: No impairment Written Expression Written Expression: Not tested   Oral / Motor  Oral Motor/Sensory Function Overall Oral Motor/Sensory Function: Mild impairment Facial ROM: Within Functional Limits Facial Symmetry: Within Functional Limits Lingual ROM: Within Functional Limits Lingual Symmetry: Within Functional Limits Lingual Strength: Reduced Velum: Within Functional Limits Mandible: Within Functional Limits Motor Speech Overall Motor Speech: Appears within functional limits for  tasks assessed Respiration: Within functional limits Phonation: Normal            Elester Grim, MA, CCC-SLP Acute Rehabilitation Services Office: [phone] 05/30/2023, 10:31 AM

## 2023-05-30 NOTE — H&P (Addendum)
 History and Physical  Jeanette [name]:[id number] DOB: 01-31-1941 DOA: 05/29/2023  PCP: Wayne Haines, MD   Chief Complaint: Code stroke, altered mental status  HPI: Jeanette [name] is a 83 y.o. female with medical history significant for A-fib on Eliquis , HTN, HLD, obesity, chronic back pain, T2DM with diabetic neuropathy, asthma, DDD, UTIs, vertebral osteomyelitis and impaired ambulation mostly wheelchair-bound who presented via EMS from SNF as a code stroke. EMS was called by her facility due to decreased responsiveness, left-sided weakness, and right-sided gaze. Unknown last well-known.  During my evaluation of the patient returned from MRI, she is alert but oriented only to self and person.   ED Course: Vitals stable but hypertensive with SBP in the 150s.  Labs showed mild anemia with Hgb 11.1, normal white count, CBG 111, normal kidney function, ammonia levels, ethanol levels and UDS. UA shows mild hemoglobinuria, mild proteinuria, positive nitrite, large leuks, RBC 21-50, WBC >50, and many bacteria. CTH with no acute intracranial abnormalities. CTA head and neck with no LVO.  Neurology was consulted for evaluation and recommended further workup with MRI although UTI likely cause of her encephalopathy. TRH was consulted for evaluation.  MRI brain with no acute intracranial abnormality  Review of Systems: Please see HPI for pertinent positives and negatives. A complete 10 system review of systems are otherwise negative.  Past Medical History:  Diagnosis Date   Abscess of lower back 09/13/2016   Allergic rhinitis    Anemia 11/11/2017   Last Assessment & Plan:  Formatting of this note is different from the original. Lab Results  Component Value Date   WBC 5.8 11/16/2017   HGB 8.9 (L) 11/16/2017   HCT 27.9 (L) 11/16/2017   MCV 78.1 (L) 11/16/2017   PLT 264 11/16/2017   -Transfuse hemoglobin less than 7 - Continue iron supplement   Aortic atherosclerosis (HCC)    Arthralgia of hip  03/01/2017   Asthma    Colon polyp    Depressive disorder    Diabetic neuropathy (HCC)    DM2 (diabetes mellitus, type 2) (HCC) 09/13/2016   Encephalopathy 09/13/2016   Fat necrosis of skin 09/13/2016   High cholesterol    History of lumbar fusion 12/06/2017   History of surgical site infection 08/10/2017   Formatting of this note might be different from the original. Added automatically from request for surgery [id number]   History of total left knee replacement 05/12/2018   HTN (hypertension) 09/13/2016   Hyperlipemia    Impaired ambulation 11/14/2017   Last Assessment & Plan:  Formatting of this note might be different from the original. PT eval PT D/C Recs: 24/7 s/PRN a and HHPT. If appropriate assistance not available at home, rec inpt post acute therapy/low intensity prior to D/C home.   Infected orthopedic implant, subsequent encounter 05/12/2018   Lumbar degenerative disc disease 03/01/2017   Lumbar radiculopathy 03/01/2017   Obesity    Osteoarthritis    Respiratory failure, acute (HCC) 08/29/2016   Vertebral osteomyelitis (HCC) 11/10/2017   Last Assessment & Plan:  Formatting of this note might be different from the original. Osteomyelitis at L5 and T12 seen on MRI on 7/25  PLAN  -  OSH cultures growing Staph Ludgunensis, oxacillin susceptible. ABX adjusted ID consulted, appreciate recs  -  Oxacillin and Rifampin  until 9/15  Ortho consulted - concerned for acute OM - no surgical intervention - IV abx x 6 weeks   IR consulted  - CT gu   Past Surgical History:  Procedure Laterality Date   ABDOMINAL HYSTERECTOMY     APPENDECTOMY     BACK SURGERY     CARDIAC CATHETERIZATION     Normal in 2002 and 2009   CHOLECYSTECTOMY     KNEE SURGERY Bilateral    SHOULDER SURGERY     SPINE SURGERY     Social History:  reports that she has never smoked. She has never used smokeless tobacco. She reports that she does not drink alcohol  and does not use drugs.  Allergies  Allergen Reactions   Lidocaine  Other (See Comments) and Palpitations    Reaction not recalled   Tramadol Itching   Rifampin  Nausea And Vomiting    vomiting  Other Reaction(s): GI Intolerance  vomiting    Adenosine    Metoprolol  Other (See Comments)    Reaction not recalled   Oxycodone  Other (See Comments)    Overly-sedates the patient   Cefepime Nausea And Vomiting    Tolerates oxacillin  Brand name for Maxipime  Other Reaction(s): GI Intolerance    Family History  Problem Relation Age of Onset   Hypertension Mother    Diabetes Mother    Stroke Mother    Hypertension Father    Heart disease Father    Diabetes Sister    Cervical cancer Sister    Cancer Brother      Prior to Admission medications   Medication Sig Start Date End Date Taking? Authorizing Provider  acetaminophen  (TYLENOL ) 325 MG tablet Take 650 mg by mouth every 4 (four) hours as needed for mild pain or moderate pain.   Yes [provider]  amLODipine  (NORVASC ) 10 MG tablet Take 1 tablet (10 mg total) by mouth daily. 03/29/22  Yes Wayne Haines, MD  apixaban  (ELIQUIS ) 5 MG TABS tablet Take 1 tablet (5 mg total) by mouth 2 (two) times daily. 03/29/22  Yes Wayne Haines, MD  FLUoxetine  (PROZAC ) 20 MG capsule Take 20 mg by mouth daily.   Yes [provider]  guaifenesin (ROBITUSSIN) 100 MG/5ML syrup Take 300 mg by mouth every 4 (four) hours as needed for cough.   Yes [provider]  hydrocortisone (ANUSOL-HC) 25 MG suppository Place 25 mg rectally every 6 (six) hours as needed for hemorrhoids or anal itching.   Yes [provider]  magnesium  hydroxide (MILK OF MAGNESIA) 400 MG/5ML suspension Take 30 mLs by mouth daily as needed for mild constipation or moderate constipation.   Yes [provider]  magnesium  oxide (MAG-OX) 400 (240 Mg) MG tablet Take 400 mg by mouth daily.   Yes [provider]  metFORMIN  (GLUCOPHAGE ) 1000 MG tablet Take 1,000 mg by mouth 2 (two) times daily with a meal.    Yes [provider]  metoprolol  succinate (TOPROL -XL) 25 MG 24 hr tablet Take 1 tablet (25 mg total) by mouth daily. 03/29/22  Yes Wayne Haines, MD  Nutritional Supplements (NUTRITIONAL DRINK) LIQD Take 1 Bottle by mouth 2 (two) times daily with a meal. Magic Cup; Give at lunch and supper   Yes [provider]  NYAMYC  powder Apply topically.   Yes [provider]  ondansetron  (ZOFRAN ) 4 MG tablet Take 4 mg by mouth every 4 (four) hours as needed for nausea or vomiting.   Yes [provider]  oxyCODONE  (OXY IR/ROXICODONE ) 5 MG immediate release tablet Take 1 tablet (5 mg total) by mouth every 6 (six) hours as needed for severe pain. 12/07/22  Yes Deforest Fast, MD  Pollen Extracts (  PROSTAT PO) Take 30 mLs by mouth daily.   Yes [provider]  pantoprazole  (PROTONIX ) 40 MG tablet Take 40 mg by mouth daily.    [provider]    Physical Exam: BP (!) 160/73   Pulse 91   Temp 97.6 F (36.4 C) (Oral)   Resp 15   Wt 79 kg   SpO2 100%   BMI 28.98 kg/m  General: Drowsy appearing elderly woman laying in bed. No acute distress. HEENT: [place]/AT. Anicteric sclera. Slight right gaze preference. PERRLA. CV: RRR. No murmurs, rubs, or gallops. No LE edema Pulmonary: Lungs CTAB. Normal effort. No wheezing or rales. Abdominal: Soft, nontender, nondistended. Normal bowel sounds. Extremities: Palpable radial and DP pulses. Normal ROM. Skin: Warm and dry. No obvious rash or lesions. Neuro: Drowsy, oriented to self and person. Disoriented to place, time and situation. Not following commands well. No facial asymmetry. Normal sensation to light touch.  Psych: Normal mood and affect          Labs on Admission:  Basic Metabolic Panel: Recent Labs  Lab 05/29/23 2237  NA 140  140  K 3.7  3.7  CL 100  101  CO2 23  GLUCOSE 122*  123*  BUN 12  12  CREATININE 0.67  0.70  CALCIUM  8.9   Liver Function Tests: Recent Labs  Lab 05/29/23 2237   AST 23  ALT 13  ALKPHOS 100  BILITOT 0.8  PROT 7.3  ALBUMIN 2.8*   No results for input(s): "LIPASE", "AMYLASE" in the last 168 hours. Recent Labs  Lab 05/29/23 2345  AMMONIA 17   CBC: Recent Labs  Lab 05/29/23 2237  WBC 8.4  NEUTROABS 5.7  HGB 11.1*  12.2  HCT 36.8  36.0  MCV 84.8  PLT 153   Cardiac Enzymes: No results for input(s): "CKTOTAL", "CKMB", "CKMBINDEX", "TROPONINI" in the last 168 hours. BNP (last 3 results) No results for input(s): "BNP" in the last 8760 hours.  ProBNP (last 3 results) No results for input(s): "PROBNP" in the last 8760 hours.  CBG: Recent Labs  Lab 05/29/23 2233  GLUCAP 111*    Radiological Exams on Admission: MR BRAIN WO CONTRAST Result Date: 05/30/2023 CLINICAL DATA:  Initial evaluation for acute neuro deficit, stroke suspected. EXAM: MRI HEAD WITHOUT CONTRAST TECHNIQUE: Multiplanar, multiecho pulse sequences of the brain and surrounding structures were obtained without intravenous contrast. COMPARISON:  Prior CT from 05/29/2023 as well as previous MRI from 09/14/2016. FINDINGS: Brain: Mild age-related cerebral atrophy. Patchy T2/FLAIR hyperintensity involving the periventricular deep white matter as well as the pons, consistent with chronic microvascular ischemic disease, moderately advanced in nature. Remote lacunar infarct present at the right thalamus. Small remote right cerebellar infarct noted. No evidence for acute or subacute infarct. Gray-white matter differentiation maintained. No acute intracranial hemorrhage. Few scattered punctate chronic micro hemorrhages noted. Additionally, note is made of small foci of susceptibility artifact within the occipital horns of both lateral ventricles (series 12, image 26). No signal changes seen at this location on corresponding sequences to suggest acute hemorrhage. No visible intraventricular hemorrhage seen on prior CT. Given this, finding is felt to be chronic in nature, although this  appears to be new as compared to prior MRI from 2018. No mass lesion, midline shift or mass effect. No hydrocephalus or extra-axial fluid collection. Pituitary gland suprasellar region within normal limits. Vascular: Major intracranial vascular flow voids are maintained. Skull and upper cervical spine: Craniocervical junction with normal limits. Bone marrow signal intensity normal.  No scalp soft tissue abnormality. Sinuses/Orbits: Globes orbital soft tissues within normal limits. Paranasal sinuses are largely clear. No mastoid effusion. Other: None. IMPRESSION: 1. No acute intracranial abnormality. 2. Age-related cerebral atrophy with moderate chronic microvascular ischemic disease, with a few small remote infarcts involving the right thalamus and right cerebellum. 3. Small foci of susceptibility artifact within the occipital horns of both lateral ventricles, felt to be chronic in nature, although this appears to be new as compared to prior MRI from 2018. Electronically Signed   By: Virgia Griffins M.D.   On: 05/30/2023 01:39   CT ANGIO HEAD NECK W WO CM (CODE STROKE) Result Date: 05/29/2023 CLINICAL DATA:  Initial evaluation for acute neuro deficit, stroke suspected, left-sided weakness, right gaze, aphasia. EXAM: CT ANGIOGRAPHY HEAD AND NECK WITH AND WITHOUT CONTRAST TECHNIQUE: Multidetector CT imaging of the head and neck was performed using the standard protocol during bolus administration of intravenous contrast. Multiplanar CT image reconstructions and MIPs were obtained to evaluate the vascular anatomy. Carotid stenosis measurements (when applicable) are obtained utilizing NASCET criteria, using the distal internal carotid diameter as the denominator. RADIATION DOSE REDUCTION: This exam was performed according to the departmental dose-optimization program which includes automated exposure control, adjustment of the mA and/or kV according to patient size and/or use of iterative reconstruction technique.  CONTRAST:  75mL OMNIPAQUE  IOHEXOL  350 MG/ML SOLN COMPARISON:  CT from earlier the same day. FINDINGS: CTA NECK FINDINGS Aortic arch: Visualized aortic arch within normal limits for caliber with standard branch pattern. Moderately advanced atheromatous change about the arch itself. Associated short-segment 65% stenosis at the proximal left subclavian artery (series 6, image 58). Right carotid system: Right common and internal carotid arteries are tortuous but patent without dissection. Calcified plaque about the right carotid bulb without hemodynamically significant greater than 50% stenosis. Left carotid system: Left common and internal carotid arteries are tortuous but patent without dissection. Calcified plaque about the left carotid bulb without hemodynamically significant greater than 50% stenosis. Irregularity about the mid-distal cervical left ICA, suggestive of FMD. Vertebral arteries: Left vertebral artery arises directly from the aortic arch. Atheromatous change at the origins of both vertebral arteries with up to moderate stenosis on the left (series 6, image 55). Vertebral arteries otherwise patent distally without significant stenosis or dissection. Skeleton: No discrete or worrisome osseous lesions. Mild chronic height loss noted at the superior endplates of T1 and T2. Advanced osteoarthritic changes noted about the C1-2 articulations. Moderate to advanced spondylosis at C5-6 and C6-7. Other neck: No other acute finding.  1.8 cm left thyroid  nodule. Upper chest: Visualized upper chest demonstrates no acute finding. Review of the MIP images confirms the above findings CTA HEAD FINDINGS Anterior circulation: Atheromatous change about the carotid siphons without hemodynamically significant stenosis. A1 segments patent bilaterally. Normal anterior communicating artery complex. Anterior cerebral arteries patent without stenosis. No M1 stenosis or occlusion. Distal MCA branches perfused and symmetric.  Posterior circulation: Both V4 segments patent without significant stenosis. Both PICA patent at their origins. Basilar patent without stenosis. Superior cerebral arteries patent bilaterally. Left PCA supplied via the basilar. Fetal type origin of the right PCA. Atheromatous irregularity about the left PCA with associated mild to moderate left P2 stenoses (series 10, image 19). PCAs otherwise widely patent to their distal aspects. Venous sinuses: Patent allowing for timing the contrast bolus. Anatomic variants: As above.  No aneurysm. Review of the MIP images confirms the above findings IMPRESSION: 1. Negative CTA for large vessel occlusion or other emergent  finding. 2. Atheromatous change about the carotid bifurcations and carotid siphons without hemodynamically significant greater than 50% stenosis. Mild-to-moderate left P2 stenoses as above. 3. Irregularity about the mid-distal cervical left ICA, suggestive of FMD. 4. Atheromatous change at the origins of both vertebral arteries with up to moderate stenosis on the left. Left vertebral artery arises directly from the aortic arch. 5. Short-segment 65% stenosis at the proximal left subclavian artery. 6.  Aortic Atherosclerosis (ICD10-I70.0). 7. 1.8 cm left thyroid  nodule. Further evaluation with dedicated thyroid  ultrasound recommended (ref: J Am Coll Radiol. 2015 Feb;12(2): 143-50). These results were communicated to Dr. Arora at 10:55 pm on 05/29/2023 by text page via the [facility] messaging system. Electronically Signed   By: Virgia Griffins M.D.   On: 05/29/2023 23:12   CT HEAD CODE STROKE WO CONTRAST Result Date: 05/29/2023 CLINICAL DATA:  Code stroke. Initial evaluation for acute neuro deficit, stroke suspected. EXAM: CT HEAD WITHOUT CONTRAST TECHNIQUE: Contiguous axial images were obtained from the base of the skull through the vertex without intravenous contrast. RADIATION DOSE REDUCTION: This exam was performed according to the departmental  dose-optimization program which includes automated exposure control, adjustment of the mA and/or kV according to patient size and/or use of iterative reconstruction technique. COMPARISON:  Prior study from 09/14/2016 FINDINGS: Brain: Age-related cerebral atrophy with moderate chronic microischemic disease. No acute intracranial hemorrhage. No acute large vessel territory infarct. No mass lesion or midline shift. No hydrocephalus or extra-axial fluid collection. Vascular: No abnormal hyperdense vessel. Calcified atherosclerosis present at the skull base. Skull: Scalp soft tissues within normal limits for calvarium intact. Sinuses/Orbits: Globes and orbital soft tissues demonstrate no acute finding. Paranasal sinuses and mastoid air cells are clear. Other: None. ASPECTS [facility] Stroke Program Early CT Score) - Ganglionic level infarction (caudate, lentiform nuclei, internal capsule, insula, M1-M3 cortex): 7 - Supraganglionic infarction (M4-M6 cortex): 3 Total score (0-10 with 10 being normal): 10 IMPRESSION: 1. No acute intracranial abnormality. 2. ASPECTS is 10. 3. Age-related cerebral atrophy with moderate chronic microvascular ischemic disease. These results were communicated to Dr. Arora at 10:48 pm on 05/29/2023 by text page via the [facility] messaging system. Electronically Signed   By: Virgia Griffins M.D.   On: 05/29/2023 22:49   Assessment/Plan Jeanette [name] is a 83 y.o. female with medical history significant for A-fib on Eliquis , HTN, HLD, obesity, chronic back pain, T2DM with diabetic neuropathy, asthma, DDD, UTIs, vertebral osteomyelitis and impaired ambulation mostly wheelchair-bound who presented via EMS from SNF as a code stroke and admitted for acute encephalopathy in the setting of UTI.  # Acute metabolic encephalopathy Patient presented from SNF as a code stroke due to unresponsiveness, left-sided weakness and right gaze preference.  Head and neck imaging overall does not show any acute  intracranial abnormalities. Normal ethanol, ammonia and UDS. UA shows signs of infection which is the most likely cause of her encephalopathy. Neurology following, recommends further evaluation with a EEG. -Admit to progressive bed -Neurology consulted, recommends EEG and reconsult if abnormal -Follow-up EEG -Management of UTI as below -Give IV NS at 100 cc/h for 10 hours until able to eat -PT/OT/SLP eval and treat -N.p.o. until passed swallow screen -Lariam precautions  # Acute cystitis Elderly patient with history of UTIs presented with acute encephalopathy and found to have evidence of infection on urinalysis. -Continue IV Rocephin  -Follow-up urine culture -Trend CBC, fever curve  # A-fib EKG on admission shows sinus rhythm with LBBB. HR stable in the 70s and 80s. -Continue Eliquis   and Toprol  XL -Telemetry -Check mag and Phos  # HTN BP elevated with SBP in the 150s to 160s -Continue amlodipine  and Toprol  XL  # T2DM On metformin  at home. Last A1c 5.9% 5 months ago. -SSI with meals   DVT prophylaxis: Eliquis     Code Status: Limited: Do not attempt resuscitation (DNR) -DNR-LIMITED -Do Not Intubate/DNI   Consults called: Neurology  Family Communication: No family at bedside  Severity of Illness: The appropriate patient status for this patient is INPATIENT. Inpatient status is judged to be reasonable and necessary in order to provide the required intensity of service to ensure the patient's safety. The patient's presenting symptoms, physical exam findings, and initial radiographic and laboratory data in the context of their chronic comorbidities is felt to place them at high risk for further clinical deterioration. Furthermore, it is not anticipated that the patient will be medically stable for discharge from the hospital within 2 midnights of admission.   * I certify that at the point of admission it is my clinical judgment that the patient will require inpatient hospital  care spanning beyond 2 midnights from the point of admission due to high intensity of service, high risk for further deterioration and high frequency of surveillance required.*  Level of care: Progressive   Vita Grip, MD 05/30/2023, 2:06 AM [facility] Pager: [phone] Isaiah [phone]   If 7PM-7AM, please contact night-coverage [email or web address] Password [email or web address]

## 2023-05-30 NOTE — Evaluation (Signed)
 Clinical/Bedside Swallow Evaluation Patient Details  Name: Jeanette [name] MRN: [id number] Date of Birth: 02/06/1941  Today's Date: 05/30/2023 Time: SLP Start Time (ACUTE ONLY): 4098 SLP Stop Time (ACUTE ONLY): 0936 SLP Time Calculation (min) (ACUTE ONLY): 8 min  Past Medical History:  Past Medical History:  Diagnosis Date   Abscess of lower back 09/13/2016   Allergic rhinitis    Anemia 11/11/2017   Last Assessment & Plan:  Formatting of this note is different from the original. Lab Results  Component Value Date   WBC 5.8 11/16/2017   HGB 8.9 (L) 11/16/2017   HCT 27.9 (L) 11/16/2017   MCV 78.1 (L) 11/16/2017   PLT 264 11/16/2017   -Transfuse hemoglobin less than 7 - Continue iron supplement   Aortic atherosclerosis (HCC)    Arthralgia of hip 03/01/2017   Asthma    Colon polyp    Depressive disorder    Diabetic neuropathy (HCC)    DM2 (diabetes mellitus, type 2) (HCC) 09/13/2016   Encephalopathy 09/13/2016   Fat necrosis of skin 09/13/2016   High cholesterol    History of lumbar fusion 12/06/2017   History of surgical site infection 08/10/2017   Formatting of this note might be different from the original. Added automatically from request for surgery [id number]   History of total left knee replacement 05/12/2018   HTN (hypertension) 09/13/2016   Hyperlipemia    Impaired ambulation 11/14/2017   Last Assessment & Plan:  Formatting of this note might be different from the original. PT eval PT D/C Recs: 24/7 s/PRN a and HHPT. If appropriate assistance not available at home, rec inpt post acute therapy/low intensity prior to D/C home.   Infected orthopedic implant, subsequent encounter 05/12/2018   Lumbar degenerative disc disease 03/01/2017   Lumbar radiculopathy 03/01/2017   Obesity    Osteoarthritis    Respiratory failure, acute (HCC) 08/29/2016   Vertebral osteomyelitis (HCC) 11/10/2017   Last Assessment & Plan:  Formatting of this note might be different from the original. Osteomyelitis at L5  and T12 seen on MRI on 7/25  PLAN  -  OSH cultures growing Staph Ludgunensis, oxacillin susceptible. ABX adjusted ID consulted, appreciate recs  -  Oxacillin and Rifampin  until 9/15  Ortho consulted - concerned for acute OM - no surgical intervention - IV abx x 6 weeks   IR consulted  - CT gu   Past Surgical History:  Past Surgical History:  Procedure Laterality Date   ABDOMINAL HYSTERECTOMY     APPENDECTOMY     BACK SURGERY     CARDIAC CATHETERIZATION     Normal in 2002 and 2009   CHOLECYSTECTOMY     KNEE SURGERY Bilateral    SHOULDER SURGERY     SPINE SURGERY     HPI:  Jeanette [name] is a 83 y.o. female  who presented via EMS from SNF as a code stroke. MRI 2/10 with no acute findings. Pt with medical history significant for A-fib on Eliquis , HTN, HLD, obesity, chronic back pain, T2DM with diabetic neuropathy, asthma, DDD, UTIs, vertebral osteomyelitis and impaired ambulation mostly wheelchair-bound    Assessment / Plan / Recommendation  Clinical Impression  Pt presents with functional swallowing as assessed clinically.  She tolerated all consistencies trialed with no clinical s/s of aspiration, including straw sips of water, and exhibited adequate clearance of solids.  Pt took pills whole with thin liquid, sometimes multiple pills, without any clinical s/s of aspiration. Pt with hx of dysphagia related to salivary  gland infection c/b odynophagia.  Recommended to have MS/thin in August of 2024.  Parotitis has now resolved.  Pt has no further ST needs for swallowing.    Recommend regular texture diet with thin liquids.   SLP Visit Diagnosis: Dysphagia, unspecified (R13.10)    Aspiration Risk  No limitations    Diet Recommendation Regular;Thin liquid    Liquid Administration via: Cup;Straw Medication Administration: Whole meds with liquid Supervision: Staff to assist with self feeding (PRN) Compensations: Slow rate;Small sips/bites Postural Changes: Seated upright at 90 degrees     Other  Recommendations Oral Care Recommendations: Oral care BID    Recommendations for follow up therapy are one component of a multi-disciplinary discharge planning process, led by the attending physician.  Recommendations may be updated based on patient status, additional functional criteria and insurance authorization.  Follow up Recommendations No SLP follow up      Assistance Recommended at Discharge    Functional Status Assessment Patient has not had a recent decline in their functional status  Frequency and Duration  (N/A)          Prognosis Prognosis for improved oropharyngeal function:  (N/A)      Swallow Study   General Date of Onset: 05/29/23 HPI: Jeanette [name] is a 83 y.o. female  who presented via EMS from SNF as a code stroke. MRI 2/10 with no acute findings. Pt with medical history significant for A-fib on Eliquis , HTN, HLD, obesity, chronic back pain, T2DM with diabetic neuropathy, asthma, DDD, UTIs, vertebral osteomyelitis and impaired ambulation mostly wheelchair-bound Type of Study: Bedside Swallow Evaluation Previous Swallow Assessment: Seen during admission August 2024 for parotitis with recs for D3/thin Diet Prior to this Study: NPO Temperature Spikes Noted: No History of Recent Intubation: No Behavior/Cognition: Alert;Cooperative;Confused Oral Cavity Assessment: Within Functional Limits Oral Care Completed by SLP: No Oral Cavity - Dentition: Adequate natural dentition Patient Positioning: Upright in bed Baseline Vocal Quality: Normal Volitional Cough:  (strained) Volitional Swallow: Able to elicit    Oral/Motor/Sensory Function Overall Oral Motor/Sensory Function: Mild impairment Facial ROM: Within Functional Limits Facial Symmetry: Within Functional Limits Lingual ROM: Within Functional Limits Lingual Symmetry: Within Functional Limits Lingual Strength: Reduced Velum: Within Functional Limits Mandible: Within Functional Limits   Ice Chips  Ice chips: Not tested   Thin Liquid Thin Liquid: Within functional limits Presentation: Straw    Nectar Thick Nectar Thick Liquid: Not tested   Honey Thick Honey Thick Liquid: Not tested   Puree Puree: Within functional limits Presentation: Spoon   Solid     Solid: Within functional limits Presentation:  (SLP fed)      Elester Grim, MA, CCC-SLP Acute Rehabilitation Services Office: [phone] 05/30/2023,10:40 AM

## 2023-05-30 NOTE — Hospital Course (Addendum)
The patient is an overweight 83 year old Caucasian female with a past medical history significant for but not limited to atrial fibrillation on anticoagulation with Eliquis, hypertension, hyperlipidemia, chronic back pain, diabetes mellitus type 2 with diabetic neuropathy, asthma, degenerative disc disease history of UTIs and vertebral osteomyelitis and also history of impaired ambulation who is mostly wheelchair-bound presented to the hospital via EMS from SNF as a code stroke.  EMS was called to her facility due to decreased responsiveness, left-sided weakness and right-sided gaze.  Her last known well time is unknown and she was only alert and oriented to self.  Head CT and MRI were done and CT of the head was done and showed no acute intracranial abnormalities and CTA of the head and neck showed no LVO.  Neurology was consulted and MRI showed no acute intracranial abnormality.  EEG was then also done and pending read.  The neurology team felt that her altered mental status was in the setting of a UTI and recommended calling neurology back if EEG was abnormal or she is not returning to baseline with treatment of her toxic metabolic derangements.  Currently she is being admitted and treated for the following but not limited to:  Assessment and Plan:  Acute Metabolic Encephalopathy in the setting of UTI -Patient presented from SNF as a code stroke due to unresponsiveness, left-sided weakness and right gaze preference.   -Head and neck imaging overall does not show any acute intracranial abnormalities.  -Normal ethanol, ammonia and UDS.  -UA shows signs of infection which is the most likely cause of her encephalopathy. -Neurology following, recommends further evaluation with a EEG. -Admit to progressive bed -Neurology consulted, recommends EEG and reconsult if abnormal -Follow-up EEG showed "This study is suggestive of cortical dysfunction arising from right hemisphere likely secondary to underlying  structural abnormality. Additionally there is moderate diffuse encephalopathy. No seizures or epileptiform discharges were seen throughout the recording." -Management of UTI as below -IVF now stopped  -PT/OT/SLP eval and treat and recommending SNF -N.p.o. until passed swallow screen but now they are recommending Regular Diet with Thin Liquids -Delirium precautions   Acute E Coli UTI and Cystitis -Elderly patient with history of UTIs presented with acute encephalopathy and found to have evidence of infection on urinalysis. -Urinalysis done and showed a cloudy appearance with small hemoglobin, large leukocytes, positive nitrites, many bacteria, 21-50 RBCs per high-power field, greater than 50 WBCs; unfortunately there is no urinalysis obtained on admission so obtain 1 now but she received antibiotics already -Continue IV Ceftriaxone -Follow-up urine culture and initial one is pending but repeat showing 40,000 CFU of E. Coli with susceptibilities to follow  -Continue monitor and trend CBC and fever curve; Current WBC: Recent Labs  Lab 05/29/23 2237 05/30/23 0451 05/31/23 0538  WBC 8.4 7.3 6.1   Paroxysmal A-fib -EKG on admission shows sinus rhythm with LBBB. HR stable in the 70s and 80s. -Continue Anticoagulation with Apixaban 5 mg po BID  and Metoprolol Succinate 25 mg po Daily -Continue to monitor on Telemetry  Hypomagnesemia -Patient's Mag Level Trend: Recent Labs  Lab 05/30/23 0451 05/31/23 0538  MG 1.2* 2.0  -Replete with IV Mag Sulfate 4 Grams yesterday -Continue to Monitor and Replete as Necessary -Repeat Mag in the AM   Hypokalemia -Patient's K+ Level Trend: Recent Labs  Lab 05/29/23 2237 05/30/23 0451 05/31/23 0538  K 3.7  3.7 4.1 3.3*  -Replete with po KCL 40 mEQ BID x2 -Continue to Monitor and Replete as Necessary -Repeat  CMP in the AM   HTN -BP elevated with SBP in the 150s to 160s on Admission -Continue Amlodipine and Toprol XL -Continue to monitor blood  pressures per protocol; pressure reading was 142/62   Diabetes Mellitus Type 2 On metformin at home. Last A1c 5.9% 5 months ago. -SSI with meals   Normocytic Anemia -Hgb/Hct Trend: Recent Labs  Lab 05/29/23 2237 05/30/23 0451 05/31/23 0538  HGB 11.1*  12.2 10.0* 9.8*  HCT 36.8  36.0 32.7* 31.9*  MCV 84.8 83.6 82.9  -Check Anemia Panel in the AM -Continue to Monitor for S/Sx of Bleeding; No overt bleeding -Repeat CBC in the AM  Thrombocytopenia  -Platelet Count Trend: Recent Labs  Lab 05/29/23 2237 05/30/23 0451 05/31/23 0538  PLT 153 125* 137*  -Continue to Monitor and Trend and Repeat CBC in the AM  Hypoalbuminemia -Patient's Albumin Trend: Recent Labs  Lab 05/29/23 2237 05/31/23 0538  ALBUMIN 2.8* 2.5*  -Continue to Monitor and Trend and repeat CMP in the AM  Overweight -Complicates overall prognosis and care -Estimated body mass index is 28.98 kg/m as calculated from the following:   Height as of 12/02/22: 5\' 5"  (1.651 m).   Weight as of this encounter: 79 kg.  -Weight Loss and Dietary Counseling given

## 2023-05-31 DIAGNOSIS — G9340 Encephalopathy, unspecified: Secondary | ICD-10-CM | POA: Diagnosis not present

## 2023-05-31 DIAGNOSIS — B9620 Unspecified Escherichia coli [E. coli] as the cause of diseases classified elsewhere: Secondary | ICD-10-CM | POA: Diagnosis not present

## 2023-05-31 DIAGNOSIS — N390 Urinary tract infection, site not specified: Secondary | ICD-10-CM

## 2023-05-31 LAB — COMPREHENSIVE METABOLIC PANEL
ALT: 11 U/L (ref 0–44)
AST: 18 U/L (ref 15–41)
Albumin: 2.5 g/dL — ABNORMAL LOW (ref 3.5–5.0)
Alkaline Phosphatase: 92 U/L (ref 38–126)
Anion gap: 9 (ref 5–15)
BUN: 11 mg/dL (ref 8–23)
CO2: 29 mmol/L (ref 22–32)
Calcium: 8.6 mg/dL — ABNORMAL LOW (ref 8.9–10.3)
Chloride: 100 mmol/L (ref 98–111)
Creatinine, Ser: 0.53 mg/dL (ref 0.44–1.00)
GFR, Estimated: >60 mL/min (ref >60)
Glucose, Bld: 110 mg/dL — ABNORMAL HIGH (ref 70–99)
Potassium: 3.3 mmol/L — ABNORMAL LOW (ref 3.5–5.1)
Sodium: 138 mmol/L (ref 135–145)
Total Bilirubin: 0.8 mg/dL (ref 0.0–1.2)
Total Protein: 6.4 g/dL — ABNORMAL LOW (ref 6.5–8.1)

## 2023-05-31 LAB — CBC WITH DIFFERENTIAL/PLATELET
Abs Immature Granulocytes: 0.02 10*3/uL (ref 0.00–0.07)
Basophils Absolute: 0 10*3/uL (ref 0.0–0.1)
Basophils Relative: 0 %
Eosinophils Absolute: 0.1 10*3/uL (ref 0.0–0.5)
Eosinophils Relative: 2 %
HCT: 31.9 % — ABNORMAL LOW (ref 36.0–46.0)
Hemoglobin: 9.8 g/dL — ABNORMAL LOW (ref 12.0–15.0)
Immature Granulocytes: 0 %
Lymphocytes Relative: 25 %
Lymphs Abs: 1.5 10*3/uL (ref 0.7–4.0)
MCH: 25.5 pg — ABNORMAL LOW (ref 26.0–34.0)
MCHC: 30.7 g/dL (ref 30.0–36.0)
MCV: 82.9 fL (ref 80.0–100.0)
Monocytes Absolute: 0.5 10*3/uL (ref 0.1–1.0)
Monocytes Relative: 8 %
Neutro Abs: 3.9 10*3/uL (ref 1.7–7.7)
Neutrophils Relative %: 65 %
Platelets: 137 10*3/uL — ABNORMAL LOW (ref 150–400)
RBC: 3.85 MIL/uL — ABNORMAL LOW (ref 3.87–5.11)
RDW: 16.1 % — ABNORMAL HIGH (ref 11.5–15.5)
WBC: 6.1 10*3/uL (ref 4.0–10.5)
nRBC: 0 % (ref 0.0–0.2)

## 2023-05-31 LAB — GLUCOSE, CAPILLARY
Glucose-Capillary: 123 mg/dL — ABNORMAL HIGH (ref 70–99)
Glucose-Capillary: 178 mg/dL — ABNORMAL HIGH (ref 70–99)
Glucose-Capillary: 176 mg/dL — ABNORMAL HIGH (ref 70–99)
Glucose-Capillary: 55 mg/dL — ABNORMAL LOW (ref 70–99)
Glucose-Capillary: 111 mg/dL — ABNORMAL HIGH (ref 70–99)

## 2023-05-31 LAB — IRON AND TIBC
Iron: 22 ug/dL — ABNORMAL LOW (ref 28–170)
Saturation Ratios: 10 % — ABNORMAL LOW (ref 10.4–31.8)
TIBC: 232 ug/dL — ABNORMAL LOW (ref 250–450)
UIBC: 210 ug/dL

## 2023-05-31 LAB — MAGNESIUM: Magnesium: 2 mg/dL (ref 1.7–2.4)

## 2023-05-31 LAB — PHOSPHORUS: Phosphorus: 3.3 mg/dL (ref 2.5–4.6)

## 2023-05-31 MED ORDER — POTASSIUM CHLORIDE CRYS ER 20 MEQ PO TBCR
40.0000 meq | EXTENDED_RELEASE_TABLET | Freq: Two times a day (BID) | ORAL | Status: AC
  Administered 2023-05-31 (×2): 40 meq via ORAL
  Filled 2023-05-31 (×2): qty 2

## 2023-05-31 NOTE — Progress Notes (Signed)
OT Cancellation Note  Patient Details Name: Jeanette [name] MRN: [id number] DOB: 11/11/1940   Cancelled Treatment:    Reason Eval/Treat Not Completed: Other (comment) (Pt from SNF and plan is to return to SNF. Per notes, Pt moved from ILF to SNF level care and primarily uses a wc for mobility due to back and knee pain. MRI (-) for CVA. No acute OT needs. Will defer any OT needs to SNF. Please reorder if eval needed. Thank you.)  [facility] 05/31/2023, 10:29 AM Luisa Dago, OT/L   Acute OT Clinical Specialist Acute Rehabilitation Services Pager [phone] Office [phone]

## 2023-05-31 NOTE — Progress Notes (Signed)
Hypoglycemic Event  CBG: 55  Treatment: 4 oz juice/soda  Symptoms: None  Follow-up CBG: Time:17:12 CBG Result:176  Possible Reasons for Event: Unknown  Comments/MD notified:Sheikh, MD    Ileana Roup

## 2023-05-31 NOTE — Plan of Care (Signed)
Conts on IV abx, BG mgt, neuro checks, and NIH.   Problem: Education: Goal: Ability to describe self-care measures that may prevent or decrease complications (Diabetes Survival Skills Education) will improve Outcome: Not Met (add Reason) Goal: Individualized Educational Video(s) Outcome: Not Met (add Reason)   Problem: Skin Integrity: Goal: Risk for impaired skin integrity will decrease Outcome: Not Met (add Reason)   Problem: Activity: Goal: Risk for activity intolerance will decrease Outcome: Not Met (add Reason)   Problem: Nutrition: Goal: Adequate nutrition will be maintained Outcome: Not Met (add Reason)   Problem: Pain Managment: Goal: General experience of comfort will improve and/or be controlled Outcome: Not Met (add Reason)   Problem: Safety: Goal: Ability to remain free from injury will improve Outcome: Not Met (add Reason)

## 2023-05-31 NOTE — Progress Notes (Signed)
PROGRESS NOTE    Jeanette [name]  ZOX:[id number] DOB: 01/12/1941 DOA: 05/29/2023 PCP: Jeanette Fat, MD   Brief Narrative:  The patient is an overweight 83 year old Caucasian female with a past medical history significant for but not limited to atrial fibrillation on anticoagulation with Eliquis, hypertension, hyperlipidemia, chronic back pain, diabetes mellitus type 2 with diabetic neuropathy, asthma, degenerative disc disease history of UTIs and vertebral osteomyelitis and also history of impaired ambulation who is mostly wheelchair-bound presented to the hospital via EMS from SNF as a code stroke.  EMS was called to her facility due to decreased responsiveness, left-sided weakness and right-sided gaze.  Her last known well time is unknown and she was only alert and oriented to self.  Head CT and MRI were done and CT of the head was done and showed no acute intracranial abnormalities and CTA of the head and neck showed no LVO.  Neurology was consulted and MRI showed no acute intracranial abnormality.  EEG was then also done and pending read.  The neurology team felt that her altered mental status was in the setting of a UTI and recommended calling neurology back if EEG was abnormal or she is not returning to baseline with treatment of her toxic metabolic derangements.  Currently she is being admitted and treated for the following but not limited to:  Assessment and Plan:  Acute Metabolic Encephalopathy -Patient presented from SNF as a code stroke due to unresponsiveness, left-sided weakness and right gaze preference.   -Head and neck imaging overall does not show any acute intracranial abnormalities. -Normal ethanol, ammonia and UDS.  -UA shows signs of infection which is the most likely cause of her encephalopathy. -Neurology following, recommends further evaluation with a EEG. -Admit to progressive bed -Neurology consulted, recommends EEG and reconsult if abnormal -Follow-up EEG showed "This  study is suggestive of cortical dysfunction arising from right hemisphere likely secondary to underlying structural abnormality. Additionally there is moderate diffuse encephalopathy. No seizures or epileptiform discharges were seen throughout the recording." -Management of UTI as below -Give IV NS at 100 cc/h for 10 hours until able to eat -PT/OT/SLP eval and treat and recommending SNF -N.p.o. until passed swallow screen but now they are recommending Regular Diet with Thin Liquids -Delirium precautions   Acute UTI Cystitis -Elderly patient with history of UTIs presented with acute encephalopathy and found to have evidence of infection on urinalysis. -Urinalysis done and showed a cloudy appearance with small hemoglobin, large leukocytes, positive nitrites, many bacteria, 21-50 RBCs per high-power field, greater than 50 WBCs; unfortunately there is no urinalysis obtained on admission so obtain 1 now but she received antibiotics already -Continue IV ceftriaxone -Follow-up urine culture -Continue monitor and trend CBC and fever curve; Current WBC: Recent Labs  Lab 05/29/23 2237 05/30/23 0451  WBC 8.4 7.3   Paroxysmal A-fib -EKG on admission shows sinus rhythm with LBBB. HR stable in the 70s and 80s. -Continue Anticoagulation with Apixaban 5 mg po BID  and Metoprolol Succinate 25 mg po Daily -C/w Telemetry -Check Mag and Phos  Hypomagnesemia -Patient's Mag Level Trend: Recent Labs  Lab 05/30/23 0451  MG 1.2*  -Replete with IV Mag Sulfate 4 Grams -Continue to Monitor and Replete as Necessary -Repeat Mag in the AM   HTN BP elevated with SBP in the 150s to 160s -Continue amlodipine and Toprol XL   Diabetes Mellitus Type 2 On metformin at home. Last A1c 5.9% 5 months ago. -SSI with meals   Normocytic Anemia -Hgb/Hct  Trend: Recent Labs  Lab 05/29/23 2237 05/30/23 0451  HGB 11.1*  12.2 10.0*  HCT 36.8  36.0 32.7*  MCV 84.8 83.6  -Check Anemia Panel in the AM -Continue to  Monitor for S/Sx of Bleeding; No overt bleeding -Repeat CBC in the AM  Thrombocytosis -Platelet Count Trend: Recent Labs  Lab 05/29/23 2237 05/30/23 0451  PLT 153 125*  -Continue to Monitor and Trend and Repeat CBC in the AM  Hypoalbuminemia -Patient's Albumin Trend: Recent Labs  Lab 05/29/23 2237  ALBUMIN 2.8*  -Continue to Monitor and Trend and repeat CMP in the AM  Overweight -Complicates overall prognosis and care -Estimated body mass index is 28.98 kg/m as calculated from the following:   Height as of 12/02/22: 5\' 5"  (1.651 m).   Weight as of this encounter: 79 kg.  -Weight Loss and Dietary Counseling given   DVT prophylaxis:  apixaban (ELIQUIS) tablet 5 mg    Code Status: Limited: Do not attempt resuscitation (DNR) -DNR-LIMITED -Do Not Intubate/DNI  Family Communication: Discussed with the patient's daughter Jeanette [name] over the telephone  Disposition Plan:  Level of care: Progressive Status is: Inpatient Remains inpatient appropriate because: Needs further clinical improvement and susceptibilities to safely discharged on p.o.   Consultants:  Neurology  Procedures:  As delineated as above  Antimicrobials:  Anti-infectives (From admission, onward)    Start     Dose/Rate Route Frequency Ordered Stop   05/29/23 2345  cefTRIAXone (ROCEPHIN) 1 g in sodium chloride 0.9 % 100 mL IVPB        1 g 200 mL/hr over 30 Minutes Intravenous Every 24 hours 05/29/23 2336         Subjective: Seen and examined at bedside and she is doing much better today and she is not as confused.  Still does not know the year or who are present days.  No nausea or vomiting.  Feels okay and having some abdominal discomfort.  No other concerns or points this time.  Objective: Vitals:   05/30/23 2325 05/31/23 0446 05/31/23 0827 05/31/23 0900  BP: (!) 127/58 130/61 137/77 (!) 142/62  Pulse: 63 76 63 76  Resp: 16 16 16 16   Temp: 97.9 F (36.6 C) (!) 97.5 F (36.4 C) 97.6 F (36.4 C) 98.1  F (36.7 C)  TempSrc: Oral Oral Oral Oral  SpO2: 93% 96% 96% 94%  Weight:        Intake/Output Summary (Last 24 hours) at 05/31/2023 1607 Last data filed at 05/31/2023 1000 Gross per 24 hour  Intake 438 ml  Output 750 ml  Net -312 ml   Filed Weights   05/29/23 2200  Weight: 79 kg   Examination: Physical Exam:  Constitutional: WN/WD overweight elderly Caucasian female in no acute distress Respiratory: Diminished to auscultation bilaterally, no wheezing, rales, rhonchi or crackles. Normal respiratory effort and patient is not tachypenic. No accessory muscle use.  Unlabored breathing Cardiovascular: RRR, no murmurs / rubs / gallops. S1 and S2 auscultated. No extremity edema.  Abdomen: Soft, non-tender, non-distended. Bowel sounds positive.  GU: Deferred. Musculoskeletal: No clubbing / cyanosis of digits/nails. No joint deformity in the upper and lower extremities.  Skin: No rashes, lesions, ulcers. No induration; Warm and dry.  Neurologic: CN 2-12 grossly intact with no focal deficits. Sensation intact in all 4 Extremities,  Psychiatric: Normal judgment and insight. Alert and oriented x 2. Normal mood and appropriate affect.   Data Reviewed: I have personally reviewed following labs and imaging studies  CBC: Recent Labs  Lab 05/29/23 2237 05/30/23 0451 05/31/23 0538  WBC 8.4 7.3 6.1  NEUTROABS 5.7  --  3.9  HGB 11.1*  12.2 10.0* 9.8*  HCT 36.8  36.0 32.7* 31.9*  MCV 84.8 83.6 82.9  PLT 153 125* 137*   Basic Metabolic Panel: Recent Labs  Lab 05/29/23 2237 05/30/23 0451 05/31/23 0538  NA 140  140 140 138  K 3.7  3.7 4.1 3.3*  CL 100  101 96* 100  CO2 23 26 29   GLUCOSE 122*  123* 120* 110*  BUN 12  12 10 11   CREATININE 0.67  0.70 0.58 0.53  CALCIUM 8.9 8.8* 8.6*  MG  --  1.2* 2.0  PHOS  --  2.5 3.3   GFR: CrCl cannot be calculated (Unknown ideal weight.). Liver Function Tests: Recent Labs  Lab 05/29/23 2237 05/31/23 0538  AST 23 18  ALT 13 11   ALKPHOS 100 92  BILITOT 0.8 0.8  PROT 7.3 6.4*  ALBUMIN 2.8* 2.5*   No results for input(s): "LIPASE", "AMYLASE" in the last 168 hours. Recent Labs  Lab 05/29/23 2345  AMMONIA 17   Coagulation Profile: Recent Labs  Lab 05/29/23 2237  INR 1.2   Cardiac Enzymes: No results for input(s): "CKTOTAL", "CKMB", "CKMBINDEX", "TROPONINI" in the last 168 hours. BNP (last 3 results) No results for input(s): "PROBNP" in the last 8760 hours. HbA1C: No results for input(s): "HGBA1C" in the last 72 hours. CBG: Recent Labs  Lab 05/30/23 0732 05/30/23 1212 05/30/23 1850 05/31/23 0945 05/31/23 1230  GLUCAP 115* 112* 107* 123* 178*   Lipid Profile: No results for input(s): "CHOL", "HDL", "LDLCALC", "TRIG", "CHOLHDL", "LDLDIRECT" in the last 72 hours. Thyroid Function Tests: No results for input(s): "TSH", "T4TOTAL", "FREET4", "T3FREE", "THYROIDAB" in the last 72 hours. Anemia Panel: No results for input(s): "VITAMINB12", "FOLATE", "FERRITIN", "TIBC", "IRON", "RETICCTPCT" in the last 72 hours. Sepsis Labs: No results for input(s): "PROCALCITON", "LATICACIDVEN" in the last 168 hours.  Recent Results (from the past 240 hours)  Urine Culture (for pregnant, neutropenic or urologic patients or patients with an indwelling urinary catheter)     Status: Abnormal (Preliminary result)   Collection Time: 05/30/23  9:50 AM   Specimen: In/Out Cath Urine  Result Value Ref Range Status   Specimen Description IN/OUT CATH URINE  Final   Special Requests NONE  Final   Culture (A)  Final    40,000 COLONIES/mL ESCHERICHIA COLI SUSCEPTIBILITIES TO FOLLOW Performed at [facility], [address], [place], [place] [address]    Report Status PENDING  Incomplete    Radiology Studies: EEG adult Result Date: 05/30/2023 Charlsie Quest, MD     05/30/2023 11:03 AM Patient Name: OYINKANSOLA TRUAX MRN: [id number] Epilepsy Attending: Charlsie Quest Referring Physician/Provider: Steffanie Rainwater, MD  Date: 05/30/2023 Duration: 23.11 mins Patient history: 83yo F with speech difficulty and left-sided weakness. EEG to evaluate for seizure Level of alertness: Awake AEDs during EEG study: None Technical aspects: This EEG study was done with scalp electrodes positioned according to the 10-20 International system of electrode placement. Electrical activity was reviewed with band pass filter of 1-70Hz , sensitivity of 7 uV/mm, display speed of 13mm/sec with a 60Hz  notched filter applied as appropriate. EEG data were recorded continuously and digitally stored.  Video monitoring was available and reviewed as appropriate. Description: EEG showed continuous generalized and lateralized right hemisphere predominantly 5 to 6 Hz theta slowing admixed with intermittent 2-3Hz  delta slowing. Hyperventilation and photic stimulation were not  performed.   ABNORMALITY - Continuous slow, generalized and lateralized right hemisphere IMPRESSION: This study is suggestive of cortical dysfunction arising from right hemisphere likely secondary to underlying structural abnormality. Additionally there is moderate diffuse encephalopathy. No seizures or epileptiform discharges were seen throughout the recording. Charlsie Quest   MR BRAIN WO CONTRAST Result Date: 05/30/2023 CLINICAL DATA:  Initial evaluation for acute neuro deficit, stroke suspected. EXAM: MRI HEAD WITHOUT CONTRAST TECHNIQUE: Multiplanar, multiecho pulse sequences of the brain and surrounding structures were obtained without intravenous contrast. COMPARISON:  Prior CT from 05/29/2023 as well as previous MRI from 09/14/2016. FINDINGS: Brain: Mild age-related cerebral atrophy. Patchy T2/FLAIR hyperintensity involving the periventricular deep white matter as well as the pons, consistent with chronic microvascular ischemic disease, moderately advanced in nature. Remote lacunar infarct present at the right thalamus. Small remote right cerebellar infarct noted. No evidence for acute  or subacute infarct. Gray-white matter differentiation maintained. No acute intracranial hemorrhage. Few scattered punctate chronic micro hemorrhages noted. Additionally, note is made of small foci of susceptibility artifact within the occipital horns of both lateral ventricles (series 12, image 26). No signal changes seen at this location on corresponding sequences to suggest acute hemorrhage. No visible intraventricular hemorrhage seen on prior CT. Given this, finding is felt to be chronic in nature, although this appears to be new as compared to prior MRI from 2018. No mass lesion, midline shift or mass effect. No hydrocephalus or extra-axial fluid collection. Pituitary gland suprasellar region within normal limits. Vascular: Major intracranial vascular flow voids are maintained. Skull and upper cervical spine: Craniocervical junction with normal limits. Bone marrow signal intensity normal. No scalp soft tissue abnormality. Sinuses/Orbits: Globes orbital soft tissues within normal limits. Paranasal sinuses are largely clear. No mastoid effusion. Other: None. IMPRESSION: 1. No acute intracranial abnormality. 2. Age-related cerebral atrophy with moderate chronic microvascular ischemic disease, with a few small remote infarcts involving the right thalamus and right cerebellum. 3. Small foci of susceptibility artifact within the occipital horns of both lateral ventricles, felt to be chronic in nature, although this appears to be new as compared to prior MRI from 2018. Electronically Signed   By: Rise Mu M.D.   On: 05/30/2023 01:39   CT ANGIO HEAD NECK W WO CM (CODE STROKE) Result Date: 05/29/2023 CLINICAL DATA:  Initial evaluation for acute neuro deficit, stroke suspected, left-sided weakness, right gaze, aphasia. EXAM: CT ANGIOGRAPHY HEAD AND NECK WITH AND WITHOUT CONTRAST TECHNIQUE: Multidetector CT imaging of the head and neck was performed using the standard protocol during bolus administration of  intravenous contrast. Multiplanar CT image reconstructions and MIPs were obtained to evaluate the vascular anatomy. Carotid stenosis measurements (when applicable) are obtained utilizing NASCET criteria, using the distal internal carotid diameter as the denominator. RADIATION DOSE REDUCTION: This exam was performed according to the departmental dose-optimization program which includes automated exposure control, adjustment of the mA and/or kV according to patient size and/or use of iterative reconstruction technique. CONTRAST:  75mL OMNIPAQUE IOHEXOL 350 MG/ML SOLN COMPARISON:  CT from earlier the same day. FINDINGS: CTA NECK FINDINGS Aortic arch: Visualized aortic arch within normal limits for caliber with standard branch pattern. Moderately advanced atheromatous change about the arch itself. Associated short-segment 65% stenosis at the proximal left subclavian artery (series 6, image 58). Right carotid system: Right common and internal carotid arteries are tortuous but patent without dissection. Calcified plaque about the right carotid bulb without hemodynamically significant greater than 50% stenosis. Left carotid system: Left common and internal  carotid arteries are tortuous but patent without dissection. Calcified plaque about the left carotid bulb without hemodynamically significant greater than 50% stenosis. Irregularity about the mid-distal cervical left ICA, suggestive of FMD. Vertebral arteries: Left vertebral artery arises directly from the aortic arch. Atheromatous change at the origins of both vertebral arteries with up to moderate stenosis on the left (series 6, image 55). Vertebral arteries otherwise patent distally without significant stenosis or dissection. Skeleton: No discrete or worrisome osseous lesions. Mild chronic height loss noted at the superior endplates of T1 and T2. Advanced osteoarthritic changes noted about the C1-2 articulations. Moderate to advanced spondylosis at C5-6 and C6-7.  Other neck: No other acute finding.  1.8 cm left thyroid nodule. Upper chest: Visualized upper chest demonstrates no acute finding. Review of the MIP images confirms the above findings CTA HEAD FINDINGS Anterior circulation: Atheromatous change about the carotid siphons without hemodynamically significant stenosis. A1 segments patent bilaterally. Normal anterior communicating artery complex. Anterior cerebral arteries patent without stenosis. No M1 stenosis or occlusion. Distal MCA branches perfused and symmetric. Posterior circulation: Both V4 segments patent without significant stenosis. Both PICA patent at their origins. Basilar patent without stenosis. Superior cerebral arteries patent bilaterally. Left PCA supplied via the basilar. Fetal type origin of the right PCA. Atheromatous irregularity about the left PCA with associated mild to moderate left P2 stenoses (series 10, image 19). PCAs otherwise widely patent to their distal aspects. Venous sinuses: Patent allowing for timing the contrast bolus. Anatomic variants: As above.  No aneurysm. Review of the MIP images confirms the above findings IMPRESSION: 1. Negative CTA for large vessel occlusion or other emergent finding. 2. Atheromatous change about the carotid bifurcations and carotid siphons without hemodynamically significant greater than 50% stenosis. Mild-to-moderate left P2 stenoses as above. 3. Irregularity about the mid-distal cervical left ICA, suggestive of FMD. 4. Atheromatous change at the origins of both vertebral arteries with up to moderate stenosis on the left. Left vertebral artery arises directly from the aortic arch. 5. Short-segment 65% stenosis at the proximal left subclavian artery. 6.  Aortic Atherosclerosis (ICD10-I70.0). 7. 1.8 cm left thyroid nodule. Further evaluation with dedicated thyroid ultrasound recommended (ref: J Am Coll Radiol. 2015 Feb;12(2): 143-50). These results were communicated to Dr. Wilford Corner at 10:55 pm on 05/29/2023 by  text page via the [facility] messaging system. Electronically Signed   By: Rise Mu M.D.   On: 05/29/2023 23:12   CT HEAD CODE STROKE WO CONTRAST Result Date: 05/29/2023 CLINICAL DATA:  Code stroke. Initial evaluation for acute neuro deficit, stroke suspected. EXAM: CT HEAD WITHOUT CONTRAST TECHNIQUE: Contiguous axial images were obtained from the base of the skull through the vertex without intravenous contrast. RADIATION DOSE REDUCTION: This exam was performed according to the departmental dose-optimization program which includes automated exposure control, adjustment of the mA and/or kV according to patient size and/or use of iterative reconstruction technique. COMPARISON:  Prior study from 09/14/2016 FINDINGS: Brain: Age-related cerebral atrophy with moderate chronic microischemic disease. No acute intracranial hemorrhage. No acute large vessel territory infarct. No mass lesion or midline shift. No hydrocephalus or extra-axial fluid collection. Vascular: No abnormal hyperdense vessel. Calcified atherosclerosis present at the skull base. Skull: Scalp soft tissues within normal limits for calvarium intact. Sinuses/Orbits: Globes and orbital soft tissues demonstrate no acute finding. Paranasal sinuses and mastoid air cells are clear. Other: None. ASPECTS [facility] Stroke Program Early CT Score) - Ganglionic level infarction (caudate, lentiform nuclei, internal capsule, insula, M1-M3 cortex): 7 - Supraganglionic infarction (M4-M6 cortex):  3 Total score (0-10 with 10 being normal): 10 IMPRESSION: 1. No acute intracranial abnormality. 2. ASPECTS is 10. 3. Age-related cerebral atrophy with moderate chronic microvascular ischemic disease. These results were communicated to Dr. Wilford Corner at 10:48 pm on 05/29/2023 by text page via the [facility] messaging system. Electronically Signed   By: Rise Mu M.D.   On: 05/29/2023 22:49   Scheduled Meds:  amLODipine  10 mg Oral Daily   apixaban  5 mg Oral BID    feeding supplement  237 mL Oral BID BM   FLUoxetine  20 mg Oral Daily   insulin aspart  0-15 Units Subcutaneous TID WC   magnesium oxide  400 mg Oral Daily   metoprolol succinate  25 mg Oral Daily   potassium chloride  40 mEq Oral BID   Continuous Infusions:  cefTRIAXone (ROCEPHIN)  IV 1 g (05/30/23 2137)    LOS: 1 day   Marguerita Merles, DO [facility] Available via Epic secure chat 7am-7pm After these hours, please refer to coverage provider listed on [email or web address] 05/31/2023, 4:07 PM

## 2023-05-31 NOTE — TOC Initial Note (Signed)
Transition of Care [facility]) - Initial/Assessment Note    Patient Details  Name: Jeanette [name] MRN: [id number] Date of Birth: 1940/11/19  Transition of Care [facility]) CM/SW Contact:    Baldemar Lenis, LCSW Phone Number: 05/31/2023, 12:45 PM  Clinical Narrative:     Patient is from [facility] and plan is to return. CSW provided brief update to [facility] [facility]. CSW to follow.              Expected Discharge Plan: Skilled Nursing Facility Barriers to Discharge: Continued Medical Work up   Patient Goals and CMS Choice Patient states their goals for this hospitalization and ongoing recovery are:: patient unable to participate in goal setting, not fully oriented CMS Medicare.gov Compare Post Acute Care list provided to:: Patient Represenative (must comment) Choice offered to / list presented to : Adult Children [place] ownership interest in [facility].provided to:: Adult Children    Expected Discharge Plan and Services     Post Acute Care Choice: Nursing Home Living arrangements for the past 2 months: Skilled Nursing Facility                                      Prior Living Arrangements/Services Living arrangements for the past 2 months: Skilled Nursing Facility Lives with:: Facility Resident Patient language and need for interpreter reviewed:: No Do you feel safe going back to the place where you live?: Yes      Need for Family Participation in Patient Care: Yes (Comment) Care giver support system in place?: Yes (comment)   Criminal Activity/Legal Involvement Pertinent to Current Situation/Hospitalization: No - Comment as needed  Activities of Daily Living      Permission Sought/Granted Permission sought to share information with : Facility Medical sales representative, Family Supports Permission granted to share information with : Yes, Verbal Permission Granted  Share Information with NAME: Vanice Sarah  Permission granted to share info w  AGENCY: Librarian, academic  Permission granted to share info w Relationship: Daughters     Emotional Assessment   Attitude/Demeanor/Rapport: Unable to Assess Affect (typically observed): Unable to Assess Orientation: : Oriented to Self, Oriented to Place Alcohol / Substance Use: Not Applicable Psych Involvement: No (comment)  Admission diagnosis:  Metabolic encephalopathy [G93.41] Acute cystitis without hematuria [N30.00] Acute encephalopathy [G93.40] Patient Active Problem List   Diagnosis Date Noted   Acute encephalopathy 05/30/2023   Acute cystitis with hematuria 05/30/2023   Acute parotitis 12/02/2022   Thyroid nodule 12/02/2022   Hypomagnesemia 12/02/2022   QT prolongation 12/02/2022   Malignant neoplasm of liver (HCC) 06/08/2022   Urinary tract infection without hematuria 05/12/2022   Recurrent UTI 04/26/2022   Liver lesion 04/26/2022   Liver abscess 03/29/2022   Debility 03/29/2022   Aortic stenosis, mild 03/16/2021   Mitral regurgitation 03/16/2021   Osteoarthritis    Obesity    Hyperlipemia    High cholesterol    Diabetic neuropathy (HCC)    Depressive disorder    Colon polyp    Asthma    Aortic atherosclerosis (HCC)    Allergic rhinitis    History of total left knee replacement 05/12/2018   Infected orthopedic implant, subsequent encounter 05/12/2018   History of lumbar fusion 12/06/2017   Impaired ambulation 11/14/2017   Anemia 11/11/2017   Vertebral osteomyelitis (HCC) 11/10/2017   History of surgical site infection 08/10/2017   Arthralgia of hip 03/01/2017   Lumbar  degenerative disc disease 03/01/2017   Lumbar radiculopathy 03/01/2017   Encephalopathy 09/13/2016   DM2 (diabetes mellitus, type 2) (HCC) 09/13/2016   HTN (hypertension) 09/13/2016   Abscess of lower back 09/13/2016   Fat necrosis of skin 09/13/2016   Respiratory failure, acute (HCC) 08/29/2016   PCP:  Crist Fat, MD Pharmacy:   [facility] [address] [place] - [address]  [address] [place] [address] Phone: [phone] Fax: [phone]  [id number] - , [place] - [address] AT [facility] [address] [place] [address] Phone: [phone] Fax: [phone]  [facility] - [place], [place] - [address] [place] [address] Phone: [phone] Fax: [phone]  [facility] [address] [place] [address] Phone: [phone] Fax: [phone]  [facility] - [place], [place] - [id number] [place] [address] Phone: [phone] Fax: [phone]     Social Drivers of Health (SDOH) Social History: SDOH Screenings   Food Insecurity: Unknown (05/31/2023)  Housing: Unknown (05/31/2023)  Transportation Needs: Unknown (05/31/2023)  Utilities: Not At Risk (05/31/2023)  Depression (PHQ2-9): Low Risk  (06/23/2018)  Financial Resource Strain: Low Risk  (01/13/2022)   Received from [facility]  Social Connections: Unknown (05/31/2023)  Stress: No Stress Concern Present (01/13/2022)   [facility] from [facility]  Tobacco Use: Low Risk  (05/29/2023)   SDOH Interventions:     Readmission Risk Interventions     No data to display

## 2023-06-01 DIAGNOSIS — G9340 Encephalopathy, unspecified: Secondary | ICD-10-CM | POA: Diagnosis not present

## 2023-06-01 LAB — CBC WITH DIFFERENTIAL/PLATELET
Abs Immature Granulocytes: 0.03 10*3/uL (ref 0.00–0.07)
Basophils Absolute: 0 10*3/uL (ref 0.0–0.1)
Basophils Relative: 0 %
Eosinophils Absolute: 0.1 10*3/uL (ref 0.0–0.5)
Eosinophils Relative: 2 %
HCT: 35 % — ABNORMAL LOW (ref 36.0–46.0)
Hemoglobin: 10.6 g/dL — ABNORMAL LOW (ref 12.0–15.0)
Immature Granulocytes: 1 %
Lymphocytes Relative: 29 %
Lymphs Abs: 1.7 10*3/uL (ref 0.7–4.0)
MCH: 25.2 pg — ABNORMAL LOW (ref 26.0–34.0)
MCHC: 30.3 g/dL (ref 30.0–36.0)
MCV: 83.1 fL (ref 80.0–100.0)
Monocytes Absolute: 0.4 10*3/uL (ref 0.1–1.0)
Monocytes Relative: 7 %
Neutro Abs: 3.7 10*3/uL (ref 1.7–7.7)
Neutrophils Relative %: 61 %
Platelets: 151 10*3/uL (ref 150–400)
RBC: 4.21 MIL/uL (ref 3.87–5.11)
RDW: 16.1 % — ABNORMAL HIGH (ref 11.5–15.5)
WBC: 6 10*3/uL (ref 4.0–10.5)
nRBC: 0 % (ref 0.0–0.2)

## 2023-06-01 LAB — COMPREHENSIVE METABOLIC PANEL
ALT: 14 U/L (ref 0–44)
AST: 28 U/L (ref 15–41)
Albumin: 2.6 g/dL — ABNORMAL LOW (ref 3.5–5.0)
Alkaline Phosphatase: 89 U/L (ref 38–126)
Anion gap: 8 (ref 5–15)
BUN: 8 mg/dL (ref 8–23)
CO2: 30 mmol/L (ref 22–32)
Calcium: 9 mg/dL (ref 8.9–10.3)
Chloride: 102 mmol/L (ref 98–111)
Creatinine, Ser: 0.69 mg/dL (ref 0.44–1.00)
GFR, Estimated: >60 mL/min (ref >60)
Glucose, Bld: 109 mg/dL — ABNORMAL HIGH (ref 70–99)
Potassium: 4.4 mmol/L (ref 3.5–5.1)
Sodium: 140 mmol/L (ref 135–145)
Total Bilirubin: 0.7 mg/dL (ref 0.0–1.2)
Total Protein: 7 g/dL (ref 6.5–8.1)

## 2023-06-01 LAB — GLUCOSE, CAPILLARY
Glucose-Capillary: 120 mg/dL — ABNORMAL HIGH (ref 70–99)
Glucose-Capillary: 185 mg/dL — ABNORMAL HIGH (ref 70–99)
Glucose-Capillary: 105 mg/dL — ABNORMAL HIGH (ref 70–99)
Glucose-Capillary: 130 mg/dL — ABNORMAL HIGH (ref 70–99)

## 2023-06-01 LAB — URINE CULTURE: Culture: >=100000 — AB

## 2023-06-01 LAB — RETICULOCYTES
Immature Retic Fract: 13.2 % (ref 2.3–15.9)
RBC.: 4.11 MIL/uL (ref 3.87–5.11)
Retic Count, Absolute: 48.5 10*3/uL (ref 19.0–186.0)
Retic Ct Pct: 1.2 % (ref 0.4–3.1)

## 2023-06-01 LAB — FOLATE: Folate: 5.4 ng/mL — ABNORMAL LOW (ref >5.9)

## 2023-06-01 LAB — VITAMIN B12: Vitamin B-12: 972 pg/mL — ABNORMAL HIGH (ref 180–914)

## 2023-06-01 LAB — MAGNESIUM: Magnesium: 1.7 mg/dL (ref 1.7–2.4)

## 2023-06-01 LAB — PHOSPHORUS: Phosphorus: 3 mg/dL (ref 2.5–4.6)

## 2023-06-01 LAB — FERRITIN: Ferritin: 23 ng/mL (ref 11–307)

## 2023-06-01 MED ORDER — MAGNESIUM SULFATE 2 GM/50ML IV SOLN
2.0000 g | Freq: Once | INTRAVENOUS | Status: AC
  Administered 2023-06-01: 2 g via INTRAVENOUS
  Filled 2023-06-01: qty 50

## 2023-06-01 MED ORDER — SODIUM CHLORIDE 0.9 % IV SOLN
1.0000 g | Freq: Two times a day (BID) | INTRAVENOUS | Status: DC
  Administered 2023-06-01 – 2023-06-03 (×5): 1 g via INTRAVENOUS
  Filled 2023-06-01 (×6): qty 20

## 2023-06-01 NOTE — Progress Notes (Signed)
Physical Therapy Treatment Patient Details Name: Jeanette [name] MRN: [id number] DOB: 05-10-1940 Today's Date: 06/01/2023   History of Present Illness Pt is an 83yo female who was brought to ED from SNF with AMS and as code stroke. Pt with L sided weakness and R gaze preference. Imaging revealed no acute intracranial abnormality. PMH: A-fib on Eliquis, HTN, HLD, obesity, chronic back pain, T2DM with diabetic neuropathy, asthma, DDD, UTIs, vertebral osteomyelitis    PT Comments  Patient continues to need assist with bed mobility and light assist with transfers. Noted in chart that pt is from LTC and uses w/c for mobility (pt confirmed). Gait goal discontinued.     If plan is discharge home, recommend the following: A lot of help with bathing/dressing/bathroom;Direct supervision/assist for financial management;A little help with walking and/or transfers;Direct supervision/assist for medications management   Can travel by private vehicle     Yes  Equipment Recommendations  None recommended by PT (has DME at LTC)    Recommendations for Other Services       Precautions / Restrictions Precautions Precautions: Fall Precaution/Restrictions Comments: urinary in continence Restrictions Weight Bearing Restrictions Per Provider Order: No     Mobility  Bed Mobility Overal bed mobility: Needs Assistance Bed Mobility: Supine to Sit     Supine to sit: Min assist, HOB elevated     General bed mobility comments: assist to raise torso final part of movement    Transfers Overall transfer level: Needs assistance Equipment used: 1 person hand held assist Transfers: Sit to/from Stand, Bed to chair/wheelchair/BSC Sit to Stand: Min assist   Step pivot transfers: Min assist       General transfer comment: minA to power up, Stood x 3 throughout transfers bed to [facility], pericare and to recliner    Ambulation/Gait               General Gait Details: NA; pt uses w/c for  locomotion   Stairs             Wheelchair Mobility     Tilt Bed    Modified Rankin (Stroke Patients Only)       Balance Overall balance assessment: Needs assistance Sitting-balance support: Feet supported, Bilateral upper extremity supported Sitting balance-Leahy Scale: Fair     Standing balance support: Single extremity supported, During functional activity Standing balance-Leahy Scale: Poor                              Communication Communication Communication: No apparent difficulties  Cognition Arousal: Alert Behavior During Therapy: Flat affect                           PT - Cognition Comments: some slow processing Following commands: Intact      Cueing Cueing Techniques: Verbal cues  Exercises      General Comments General comments (skin integrity, edema, etc.): assisted to/from [facility]      Pertinent Vitals/Pain Pain Assessment Pain Assessment: No/denies pain    Home Living Family/patient expects to be discharged to:: Skilled nursing facility     Type of Home:  (SNF per chart review)             Additional Comments: per chart patient from facility. when asked pt states she lives in a facility however unable to state name of facility.    Prior Function  PT Goals (current goals can now be found in the care plan section) Acute Rehab PT Goals Patient Stated Goal: didn't state PT Goal Formulation: With patient Time For Goal Achievement: 06/13/23 Potential to Achieve Goals: Good Progress towards PT goals: Goals downgraded-see care plan (non-ambulatory PTA)    Frequency    Min 1X/week      PT Plan      Co-evaluation              AM-PAC PT "6 Clicks" Mobility   Outcome Measure  Help needed turning from your back to your side while in a flat bed without using bedrails?: A Little Help needed moving from lying on your back to sitting on the side of a flat bed without using bedrails?: A  Little Help needed moving to and from a bed to a chair (including a wheelchair)?: A Little Help needed standing up from a chair using your arms (e.g., wheelchair or bedside chair)?: A Little Help needed to walk in hospital room?: Total Help needed climbing 3-5 steps with a railing? : Total 6 Click Score: 14    End of Session Equipment Utilized During Treatment: Gait belt Activity Tolerance: Patient tolerated treatment well Patient left: in chair;with call bell/phone within reach;with chair alarm set Nurse Communication: Mobility status;Other (comment) (new purewick, no briefs on pt) PT Visit Diagnosis: Unsteadiness on feet (R26.81)     Time: 1026-1100 PT Time Calculation (min) (ACUTE ONLY): 34 min  Charges:    $Therapeutic Activity: 23-37 mins PT General Charges $$ ACUTE PT VISIT: 1 Visit                      Jerolyn Center, PT Acute Rehabilitation Services  Office [phone]    Zena Amos 06/01/2023, 11:15 AM

## 2023-06-01 NOTE — [place] FL2 (Signed)
[place] MEDICAID FL2 LEVEL OF CARE FORM     IDENTIFICATION  Patient Name: Jeanette [name] Birthdate: 06/13/1940 Sex: female Admission Date (Current Location): 05/29/2023  [facility] and [place] Number:  Chiropodist and Address:  [facility] [place], [address] [place], [place] [address]      Provider Number: [id number]  Attending Physician Name and Address:  Fran Lowes, DO  Relative Name and Phone Number:       Current Level of Care: Hospital Recommended Level of Care: Skilled Nursing Facility Prior Approval Number:    Date Approved/Denied:   PASRR Number: [id number]  Discharge Plan: SNF    Current Diagnoses: Patient Active Problem List   Diagnosis Date Noted   Acute encephalopathy 05/30/2023   Acute cystitis with hematuria 05/30/2023   Acute parotitis 12/02/2022   Thyroid nodule 12/02/2022   Hypomagnesemia 12/02/2022   QT prolongation 12/02/2022   Malignant neoplasm of liver (HCC) 06/08/2022   Urinary tract infection without hematuria 05/12/2022   Recurrent UTI 04/26/2022   Liver lesion 04/26/2022   Liver abscess 03/29/2022   Debility 03/29/2022   Aortic stenosis, mild 03/16/2021   Mitral regurgitation 03/16/2021   Osteoarthritis    Obesity    Hyperlipemia    High cholesterol    Diabetic neuropathy (HCC)    Depressive disorder    Colon polyp    Asthma    Aortic atherosclerosis (HCC)    Allergic rhinitis    History of total left knee replacement 05/12/2018   Infected orthopedic implant, subsequent encounter 05/12/2018   History of lumbar fusion 12/06/2017   Impaired ambulation 11/14/2017   Anemia 11/11/2017   Vertebral osteomyelitis (HCC) 11/10/2017   History of surgical site infection 08/10/2017   Arthralgia of hip 03/01/2017   Lumbar degenerative disc disease 03/01/2017   Lumbar radiculopathy 03/01/2017   Encephalopathy 09/13/2016   DM2 (diabetes mellitus, type 2) (HCC) 09/13/2016   HTN (hypertension) 09/13/2016    Abscess of lower back 09/13/2016   Fat necrosis of skin 09/13/2016   Respiratory failure, acute (HCC) 08/29/2016    Orientation RESPIRATION BLADDER Height & Weight     Place  Normal Incontinent Weight: 174 lb 2.6 oz (79 kg) Height:  5\' 5"  (165.1 cm)  BEHAVIORAL SYMPTOMS/MOOD NEUROLOGICAL BOWEL NUTRITION STATUS      Incontinent Diet (Regular Fluid Consistency)  AMBULATORY STATUS COMMUNICATION OF NEEDS Skin   Extensive Assist Verbally Normal                       Personal Care Assistance Level of Assistance  Dressing, Feeding, Bathing Bathing Assistance: Maximum assistance Feeding assistance: Limited assistance Dressing Assistance: Maximum assistance     Functional Limitations Info  Sight Sight Info: Impaired        SPECIAL CARE FACTORS FREQUENCY  PT (By licensed PT), OT (By licensed OT)     PT Frequency: 5x/week OT Frequency: 5x/week            Contractures      Additional Factors Info  Code Status, Allergies Code Status Info: DNR Allergies Info: Lidocaine, Tramadol, Rifampin, Adenosine, Metoprolol, Oxycodone, Cefepime           Current Medications (06/01/2023):  This is the current hospital active medication list Current Facility-Administered Medications  Medication Dose Route Frequency Provider Last Rate Last Admin   acetaminophen (TYLENOL) tablet 650 mg  650 mg Oral Q6H PRN Steffanie Rainwater, MD       Or  acetaminophen (TYLENOL) suppository 650 mg  650 mg Rectal Q6H PRN Steffanie Rainwater, MD       amLODipine (NORVASC) tablet 10 mg  10 mg Oral Daily Steffanie Rainwater, MD   10 mg at 06/01/23 6962   apixaban (ELIQUIS) tablet 5 mg  5 mg Oral BID Steffanie Rainwater, MD   5 mg at 06/01/23 9528   feeding supplement (ENSURE ENLIVE / ENSURE PLUS) liquid 237 mL  237 mL Oral BID BM Steffanie Rainwater, MD   237 mL at 06/01/23 0937   FLUoxetine (PROZAC) capsule 20 mg  20 mg Oral Daily Steffanie Rainwater, MD   20 mg at 06/01/23 4132   insulin aspart  (novoLOG) injection 0-15 Units  0-15 Units Subcutaneous TID WC Steffanie Rainwater, MD   3 Units at 05/31/23 1236   magnesium sulfate IVPB 2 g 50 mL  2 g Intravenous Once Swayze, Ava, DO       meropenem (MERREM) 1 g in sodium chloride 0.9 % 100 mL IVPB  1 g Intravenous Q12H Swayze, Ava, DO 200 mL/hr at 06/01/23 0943 1 g at 06/01/23 0943   metoprolol succinate (TOPROL-XL) 24 hr tablet 25 mg  25 mg Oral Daily Steffanie Rainwater, MD   25 mg at 06/01/23 4401   senna-docusate (Senokot-S) tablet 1 tablet  1 tablet Oral QHS PRN Steffanie Rainwater, MD         Discharge Medications: Please see discharge summary for a list of discharge medications.  Relevant Imaging Results:  Relevant Lab Results:   Additional Information SSN: [id number]  Jeannelle Wiens Felipa [place], Student-Social Work

## 2023-06-01 NOTE — Progress Notes (Signed)
PROGRESS NOTE    Jeanette [name]  ZOX:[id number] DOB: 1940/10/16 DOA: 05/29/2023 PCP: Crist Fat, MD   Brief Narrative:  The patient is an overweight 83 year old Caucasian female with a past medical history significant for but not limited to atrial fibrillation on anticoagulation with Eliquis, hypertension, hyperlipidemia, chronic back pain, diabetes mellitus type 2 with diabetic neuropathy, asthma, degenerative disc disease history of UTIs and vertebral osteomyelitis and also history of impaired ambulation who is mostly wheelchair-bound presented to the hospital via EMS from SNF as a code stroke.  EMS was called to her facility due to decreased responsiveness, left-sided weakness and right-sided gaze.  Her last known well time is unknown and she was only alert and oriented to self.  Head CT and MRI were done and CT of the head was done and showed no acute intracranial abnormalities and CTA of the head and neck showed no LVO.  Neurology was consulted and MRI showed no acute intracranial abnormality.  EEG was then also done and pending read.  The neurology team felt that her altered mental status was in the setting of a UTI and recommended calling neurology back if EEG was abnormal or she is not returning to baseline with treatment of her toxic metabolic derangements.  Currently she is being admitted and treated for: acute metabolic encephalopathy, UTI (ESBL E. Coli), paroxysmal atrial fibrillation, hypomagnesemia, anemia, thrombocytopenia, hypoalbuminemia.  The plan is for the patient to discharge to SNF.  Assessment and Plan:   Acute Metabolic Encephalopathy -Patient presented from SNF as a code stroke due to unresponsiveness, left-sided weakness and right gaze preference.   -Head and neck imaging overall does not show any acute intracranial abnormalities. -Normal ethanol, ammonia and UDS.  -UA shows signs of infection which is the most likely cause of her encephalopathy. -Neurology following,  recommends further evaluation with a EEG. -Admit to progressive bed -Neurology consulted, recommends EEG and reconsult if abnormal -Follow-up EEG showed "This study is suggestive of cortical dysfunction arising from right hemisphere likely secondary to underlying structural abnormality. Additionally there is moderate diffuse encephalopathy. No seizures or epileptiform discharges were seen throughout the recording." -Management of UTI as below -Give IV NS at 100 cc/h for 10 hours until able to eat -PT/OT/SLP eval and treat and recommending SNF -N.p.o. until passed swallow screen but now they are recommending Regular Diet with Thin Liquids -Delirium precautions   Acute UTI Cystitis -Elderly patient with history of UTIs presented with acute encephalopathy and found to have evidence of infection on urinalysis. -Urinalysis done and showed a cloudy appearance with small hemoglobin, large leukocytes, positive nitrites, many bacteria, 21-50 RBCs per high-power field, greater than 50 WBCs; unfortunately there is no urinalysis obtained on admission so obtain 1 now but she received antibiotics already -Continue IV ceftriaxone -Urine culture has grown out ESBL E. Coli. She has been transitioned to Meropenem. -Continue monitor and trend CBC and fever curve; Current WBC:     Recent Labs  Lab 05/29/23 2237 05/30/23 0451  WBC 8.4 7.3     Iron Deficiency Anemia:' Will start iron supplementation when Infectious source is under control. Paroxysmal A-fib -EKG on admission shows sinus rhythm with LBBB. HR stable in the 70s and 80s. -Continue Anticoagulation with Apixaban 5 mg po BID  and Metoprolol Succinate 25 mg po Daily -C/w Telemetry -Check Mag and Phos   Hypomagnesemia -Magnesium is 1.7 today. -Replete with IV Mag Sulfate 4 Grams -Continue to Monitor and Replete as Necessary -Follow magnesium   HTN  SBP in 140's and DBP in the 40's to 60  -Continue amlodipine and Toprol XL   Diabetes  Mellitus Type 2 On metformin at home. Last A1c 5.9% 5 months ago. -SSI with meals   Normocytic Anemia with iron deficiency anemia -Hgb/Hct Trend: -iron deficiency is present. Iron will be supplemented when infection is under control. -Continue to Monitor for S/Sx of Bleeding; No overt bleeding -Repeat CBC in the AM   Thrombocytosis -Platelet Count Trend:     Recent Labs  Lab 05/29/23 2237 05/30/23 0451  PLT 153 125*  -Continue to Monitor and Trend and Repeat CBC in the AM   Hypoalbuminemia -Patient's Albumin Trend:    Recent Labs  Lab 05/29/23 2237  ALBUMIN 2.8*  -Continue to Monitor and Trend and repeat CMP in the AM   Overweight -Complicates overall prognosis and care -Estimated body mass index is 28.98 kg/m as calculated from the following:   Height as of 12/02/22: 5\' 5"  (1.651 m).   Weight as of this encounter: 79 kg.  -Weight Loss and Dietary Counseling given   DVT prophylaxis:  apixaban (ELIQUIS) tablet 5 mg    Code Status: Limited: Do not attempt resuscitation (DNR) -DNR-LIMITED -Do Not Intubate/DNI  Family Communication: Discussed with the patient's daughter [name] over the telephone   Disposition Plan:  Level of care: Progressive Status is: Inpatient Remains inpatient appropriate because: Needs further clinical improvement and susceptibilities to safely discharged on p.o.   Consultants:  Neurology   Procedures:  As delineated as above   Antimicrobials:  Anti-infectives(From admission, onward)      Antimicrobials:  Anti-infectives (From admission, onward)    Start     Dose/Rate Route Frequency Ordered Stop   06/01/23 1015  meropenem (MERREM) 1 g in sodium chloride 0.9 % 100 mL IVPB        1 g 200 mL/hr over 30 Minutes Intravenous Every 12 hours 06/01/23 0917     05/29/23 2345  cefTRIAXone (ROCEPHIN) 1 g in sodium chloride 0.9 % 100 mL IVPB  Status:  Discontinued        1 g 200 mL/hr over 30 Minutes Intravenous Every 24 hours 05/29/23 2336  06/01/23 0917       Subjective: The patient is resting comfortably. No new complaints.   Objective: Vitals:   06/01/23 0840 06/01/23 0900 06/01/23 1152 06/01/23 1624  BP: (!) 147/52  (!) 142/48 (!) 149/67  Pulse: 64  66 72  Resp: 16   17  Temp: 98.1 F (36.7 C)  (!) 97.5 F (36.4 C) 97.7 F (36.5 C)  TempSrc: Oral  Oral Oral  SpO2: 95%  95% 96%  Weight:      Height:  5\' 5"  (1.651 m)      Intake/Output Summary (Last 24 hours) at 06/01/2023 1714 Last data filed at 06/01/2023 1625 Gross per 24 hour  Intake 150 ml  Output 900 ml  Net -750 ml   Filed Weights   05/29/23 2200  Weight: 79 kg   Examination: Physical Exam:  Exam:  Constitutional:  The patient is awake, alert, and oriented x 3. No acute distress. Respiratory:  No increased work of breathing. No wheezes, rales, or rhonchi No tactile fremitus Cardiovascular:  Regular rate and rhythm No murmurs, ectopy, or gallups. No lateral PMI. No thrills. Abdomen:  Abdomen is soft, non-tender, non-distended No hernias, masses, or organomegaly Normoactive bowel sounds.  Musculoskeletal:  No cyanosis, clubbing, or edema Skin:  No rashes, lesions, ulcers palpation of skin: no induration  or nodules Neurologic:  CN 2-12 intact Sensation all 4 extremities intact Psychiatric:  Mental status Mood, affect appropriate Orientation to person, place, time  judgment and insight appear intact   Data Reviewed: I have personally reviewed following labs and imaging studies  CBC: Recent Labs  Lab 05/29/23 2237 05/30/23 0451 05/31/23 0538 06/01/23 0558  WBC 8.4 7.3 6.1 6.0  NEUTROABS 5.7  --  3.9 3.7  HGB 11.1*  12.2 10.0* 9.8* 10.6*  HCT 36.8  36.0 32.7* 31.9* 35.0*  MCV 84.8 83.6 82.9 83.1  PLT 153 125* 137* 151   Basic Metabolic Panel: Recent Labs  Lab 05/29/23 2237 05/30/23 0451 05/31/23 0538 06/01/23 0558  NA 140  140 140 138 140  K 3.7  3.7 4.1 3.3* 4.4  CL 100  101 96* 100 102  CO2 23 26 29 30    GLUCOSE 122*  123* 120* 110* 109*  BUN 12  12 10 11 8   CREATININE 0.67  0.70 0.58 0.53 0.69  CALCIUM 8.9 8.8* 8.6* 9.0  MG  --  1.2* 2.0 1.7  PHOS  --  2.5 3.3 3.0   GFR: Estimated Creatinine Clearance: 58.3 mL/min (by C-G formula based on SCr of 0.69 mg/dL). Liver Function Tests: Recent Labs  Lab 05/29/23 2237 05/31/23 0538 06/01/23 0558  AST 23 18 28   ALT 13 11 14   ALKPHOS 100 92 89  BILITOT 0.8 0.8 0.7  PROT 7.3 6.4* 7.0  ALBUMIN 2.8* 2.5* 2.6*   No results for input(s): "LIPASE", "AMYLASE" in the last 168 hours. Recent Labs  Lab 05/29/23 2345  AMMONIA 17   Coagulation Profile: Recent Labs  Lab 05/29/23 2237  INR 1.2   Cardiac Enzymes: No results for input(s): "CKTOTAL", "CKMB", "CKMBINDEX", "TROPONINI" in the last 168 hours. BNP (last 3 results) No results for input(s): "PROBNP" in the last 8760 hours. HbA1C: No results for input(s): "HGBA1C" in the last 72 hours. CBG: Recent Labs  Lab 05/31/23 1712 05/31/23 2114 06/01/23 0620 06/01/23 1152 06/01/23 1622  GLUCAP 176* 111* 120* 185* 105*   Lipid Profile: No results for input(s): "CHOL", "HDL", "LDLCALC", "TRIG", "CHOLHDL", "LDLDIRECT" in the last 72 hours. Thyroid Function Tests: No results for input(s): "TSH", "T4TOTAL", "FREET4", "T3FREE", "THYROIDAB" in the last 72 hours. Anemia Panel: Recent Labs    05/31/23 1703 06/01/23 0558  VITAMINB12  --  972*  FOLATE  --  5.4*  FERRITIN  --  23  TIBC 232*  --   IRON 22*  --   RETICCTPCT  --  1.2   Sepsis Labs: No results for input(s): "PROCALCITON", "LATICACIDVEN" in the last 168 hours.  Recent Results (from the past 240 hours)  Urine Culture     Status: Abnormal   Collection Time: 05/29/23 11:08 PM   Specimen: In/Out Cath Urine  Result Value Ref Range Status   Specimen Description IN/OUT CATH URINE  Final   Special Requests   Final    NONE Performed at [facility], [address] [place], [place] [address]    Culture (A)  Final     >=100,000 COLONIES/mL ESCHERICHIA COLI Confirmed Extended Spectrum Beta-Lactamase Producer (ESBL).  In bloodstream infections from ESBL organisms, carbapenems are preferred over piperacillin/tazobactam. They are shown to have a lower risk of mortality.    Report Status 06/01/2023 FINAL  Final   Organism ID, Bacteria ESCHERICHIA COLI (A)  Final      Susceptibility   Escherichia coli - MIC*    AMPICILLIN >=32 RESISTANT Resistant  CEFAZOLIN >=64 RESISTANT Resistant     CEFEPIME 16 RESISTANT Resistant     CEFTRIAXONE >=64 RESISTANT Resistant     CIPROFLOXACIN >=4 RESISTANT Resistant     GENTAMICIN >=16 RESISTANT Resistant     IMIPENEM <=0.25 SENSITIVE Sensitive     NITROFURANTOIN 64 INTERMEDIATE Intermediate     TRIMETH/SULFA <=20 SENSITIVE Sensitive     AMPICILLIN/SULBACTAM >=32 RESISTANT Resistant     PIP/TAZO 16 SENSITIVE Sensitive ug/mL    * >=100,000 COLONIES/mL ESCHERICHIA COLI  Urine Culture (for pregnant, neutropenic or urologic patients or patients with an indwelling urinary catheter)     Status: Abnormal   Collection Time: 05/30/23  9:50 AM   Specimen: In/Out Cath Urine  Result Value Ref Range Status   Specimen Description IN/OUT CATH URINE  Final   Special Requests   Final    NONE Performed at [facility], [address], [place], [place] [address]    Culture (A)  Final    40,000 COLONIES/mL ESCHERICHIA COLI Confirmed Extended Spectrum Beta-Lactamase Producer (ESBL).  In bloodstream infections from ESBL organisms, carbapenems are preferred over piperacillin/tazobactam. They are shown to have a lower risk of mortality.    Report Status 06/01/2023 FINAL  Final   Organism ID, Bacteria ESCHERICHIA COLI (A)  Final      Susceptibility   Escherichia coli - MIC*    AMPICILLIN >=32 RESISTANT Resistant     CEFAZOLIN >=64 RESISTANT Resistant     CEFEPIME 16 RESISTANT Resistant     CEFTRIAXONE >=64 RESISTANT Resistant     CIPROFLOXACIN >=4 RESISTANT Resistant      GENTAMICIN >=16 RESISTANT Resistant     IMIPENEM <=0.25 SENSITIVE Sensitive     NITROFURANTOIN 64 INTERMEDIATE Intermediate     TRIMETH/SULFA <=20 SENSITIVE Sensitive     AMPICILLIN/SULBACTAM >=32 RESISTANT Resistant     PIP/TAZO 8 SENSITIVE Sensitive ug/mL    * 40,000 COLONIES/mL ESCHERICHIA COLI    Radiology Studies: No results found.  Scheduled Meds:  amLODipine  10 mg Oral Daily   apixaban  5 mg Oral BID   feeding supplement  237 mL Oral BID BM   FLUoxetine  20 mg Oral Daily   insulin aspart  0-15 Units Subcutaneous TID WC   metoprolol succinate  25 mg Oral Daily   Continuous Infusions:  meropenem (MERREM) IV 1 g (06/01/23 0943)    LOS: 2 days   Fahim Kats, DO [facility] Available via Epic secure chat 7am-7pm After these hours, please refer to coverage provider listed on [email or web address] 06/01/2023, 5:14 PM

## 2023-06-01 NOTE — Inpatient Diabetes Management (Signed)
Inpatient Diabetes Program Recommendations  AACE/ADA: New Consensus Statement on Inpatient Glycemic Control (2015)  Target Ranges:  Prepandial:   less than 140 mg/dL      Peak postprandial:   less than 180 mg/dL (1-2 hours)      Critically ill patients:  140 - 180 mg/dL   Lab Results  Component Value Date   GLUCAP 185 (H) 06/01/2023   HGBA1C 5.9 (H) 12/02/2022    Review of Glycemic Control  Latest Reference Range & Units 05/31/23 09:45 05/31/23 12:30 05/31/23 16:31 05/31/23 17:12 05/31/23 21:14 06/01/23 06:20 06/01/23 11:52  Glucose-Capillary 70 - 99 mg/dL 161 (H) 096 (H) 55 (L) 176 (H) 111 (H) 120 (H) 185 (H)   Diabetes history: DM 2 Outpatient Diabetes medications: Metformin 1000 mg bid Current orders for Inpatient glycemic control:  Novolog 0-15 units tid  Note hypoglycemia yesterday on current correction scale Due to age and sensitivity will need lower doses of insulin  Inpatient Diabetes Program Recommendations:    -   Reduce Novolog correction to 0-9 units tid + hs  Thanks,  Christena Deem RN, MSN, BC-ADM Inpatient Diabetes Coordinator Team Pager [phone] (8a-5p)

## 2023-06-02 DIAGNOSIS — G9340 Encephalopathy, unspecified: Secondary | ICD-10-CM | POA: Diagnosis not present

## 2023-06-02 LAB — CBC WITH DIFFERENTIAL/PLATELET
Abs Immature Granulocytes: 0.01 10*3/uL (ref 0.00–0.07)
Basophils Absolute: 0 10*3/uL (ref 0.0–0.1)
Basophils Relative: 0 %
Eosinophils Absolute: 0.1 10*3/uL (ref 0.0–0.5)
Eosinophils Relative: 2 %
HCT: 34.7 % — ABNORMAL LOW (ref 36.0–46.0)
Hemoglobin: 10.7 g/dL — ABNORMAL LOW (ref 12.0–15.0)
Immature Granulocytes: 0 %
Lymphocytes Relative: 21 %
Lymphs Abs: 1.4 10*3/uL (ref 0.7–4.0)
MCH: 25.7 pg — ABNORMAL LOW (ref 26.0–34.0)
MCHC: 30.8 g/dL (ref 30.0–36.0)
MCV: 83.4 fL (ref 80.0–100.0)
Monocytes Absolute: 0.5 10*3/uL (ref 0.1–1.0)
Monocytes Relative: 7 %
Neutro Abs: 4.8 10*3/uL (ref 1.7–7.7)
Neutrophils Relative %: 70 %
Platelets: 153 10*3/uL (ref 150–400)
RBC: 4.16 MIL/uL (ref 3.87–5.11)
RDW: 16 % — ABNORMAL HIGH (ref 11.5–15.5)
WBC: 6.8 10*3/uL (ref 4.0–10.5)
nRBC: 0 % (ref 0.0–0.2)

## 2023-06-02 LAB — BASIC METABOLIC PANEL
Anion gap: 12 (ref 5–15)
BUN: 7 mg/dL — ABNORMAL LOW (ref 8–23)
CO2: 30 mmol/L (ref 22–32)
Calcium: 9.3 mg/dL (ref 8.9–10.3)
Chloride: 97 mmol/L — ABNORMAL LOW (ref 98–111)
Creatinine, Ser: 0.61 mg/dL (ref 0.44–1.00)
GFR, Estimated: >60 mL/min (ref >60)
Glucose, Bld: 117 mg/dL — ABNORMAL HIGH (ref 70–99)
Potassium: 4.1 mmol/L (ref 3.5–5.1)
Sodium: 139 mmol/L (ref 135–145)

## 2023-06-02 LAB — GLUCOSE, CAPILLARY
Glucose-Capillary: 117 mg/dL — ABNORMAL HIGH (ref 70–99)
Glucose-Capillary: 180 mg/dL — ABNORMAL HIGH (ref 70–99)
Glucose-Capillary: 120 mg/dL — ABNORMAL HIGH (ref 70–99)
Glucose-Capillary: 155 mg/dL — ABNORMAL HIGH (ref 70–99)

## 2023-06-02 LAB — MRSA NEXT GEN BY PCR, NASAL: MRSA by PCR Next Gen: DETECTED — AB

## 2023-06-02 MED ORDER — CHLORHEXIDINE GLUCONATE CLOTH 2 % EX PADS
6.0000 | MEDICATED_PAD | Freq: Every day | CUTANEOUS | Status: DC
  Administered 2023-06-02 – 2023-06-05 (×3): 6 via TOPICAL

## 2023-06-02 MED ORDER — MUPIROCIN 2 % EX OINT
1.0000 | TOPICAL_OINTMENT | Freq: Two times a day (BID) | CUTANEOUS | Status: DC
  Administered 2023-06-02 – 2023-06-06 (×8): 1 via NASAL
  Filled 2023-06-02 (×2): qty 22

## 2023-06-02 NOTE — Plan of Care (Signed)
Problem: Education: Goal: Ability to describe self-care measures that may prevent or decrease complications (Diabetes Survival Skills Education) will improve Outcome: Progressing   Problem: Coping: Goal: Ability to adjust to condition or change in health will improve Outcome: Progressing   Problem: Fluid Volume: Goal: Ability to maintain a balanced intake and output will improve Outcome: Progressing   Problem: Metabolic: Goal: Ability to maintain appropriate glucose levels will improve Outcome: Progressing   Problem: Nutritional: Goal: Maintenance of adequate nutrition will improve Outcome: Progressing Goal: Progress toward achieving an optimal weight will improve Outcome: Progressing   Problem: Skin Integrity: Goal: Risk for impaired skin integrity will decrease Outcome: Progressing   Problem: Tissue Perfusion: Goal: Adequacy of tissue perfusion will improve Outcome: Progressing

## 2023-06-02 NOTE — Progress Notes (Signed)
PROGRESS NOTE    Jeanette [name]  ZOX:[id number] DOB: 08-27-1940 DOA: 05/29/2023 PCP: Crist Fat, MD   Brief Narrative:  The patient is an overweight 83 year old Caucasian female with a past medical history significant for but not limited to atrial fibrillation on anticoagulation with Eliquis, hypertension, hyperlipidemia, chronic back pain, diabetes mellitus type 2 with diabetic neuropathy, asthma, degenerative disc disease history of UTIs and vertebral osteomyelitis and also history of impaired ambulation who is mostly wheelchair-bound presented to the hospital via EMS from SNF as a code stroke.  EMS was called to her facility due to decreased responsiveness, left-sided weakness and right-sided gaze.  Her last known well time is unknown and she was only alert and oriented to self.  Head CT and MRI were done and CT of the head was done and showed no acute intracranial abnormalities and CTA of the head and neck showed no LVO.  Neurology was consulted and MRI showed no acute intracranial abnormality.  EEG was then also done and pending read.  The neurology team felt that her altered mental status was in the setting of a UTI and recommended calling neurology back if EEG was abnormal or she is not returning to baseline with treatment of her toxic metabolic derangements.  Currently she is being admitted and treated for: acute metabolic encephalopathy, UTI (ESBL E. Coli), paroxysmal atrial fibrillation, hypomagnesemia, anemia, thrombocytopenia, hypoalbuminemia.  The plan is for the patient to discharge to SNF. She has three more days of imipenem prior to discharge.  Assessment and Plan:   Acute Metabolic Encephalopathy -Patient presented from SNF as a code stroke due to unresponsiveness, left-sided weakness and right gaze preference.   -Head and neck imaging overall does not show any acute intracranial abnormalities. -Normal ethanol, ammonia and UDS.  -UA shows signs of infection which is the most  likely cause of her encephalopathy. -Neurology following, recommends further evaluation with a EEG. -Admit to progressive bed -Neurology consulted, recommends EEG and reconsult if abnormal -Follow-up EEG showed "This study is suggestive of cortical dysfunction arising from right hemisphere likely secondary to underlying structural abnormality. Additionally there is moderate diffuse encephalopathy. No seizures or epileptiform discharges were seen throughout the recording." -Management of UTI as below -Give IV NS at 100 cc/h for 10 hours until able to eat -PT/OT/SLP eval and treat and recommending SNF -N.p.o. until passed swallow screen but now they are recommending Regular Diet with Thin Liquids -Delirium precautions   Acute UTI Cystitis -Elderly patient with history of UTIs presented with acute encephalopathy and found to have evidence of infection on urinalysis. -Urinalysis done and showed a cloudy appearance with small hemoglobin, large leukocytes, positive nitrites, many bacteria, 21-50 RBCs per high-power field, greater than 50 WBCs; unfortunately there is no urinalysis obtained on admission so obtain 1 now but she received antibiotics already -Continue IV ceftriaxone -Urine culture has grown out ESBL E. Coli. She has been transitioned to Meropenem. She will need 5 days for her complicated UTI. -Continue monitor and trend CBC and fever curve; Current WBC:     Recent Labs  Lab 05/29/23 2237 05/30/23 0451  WBC 8.4 7.3     Iron Deficiency Anemia:' Will start iron supplementation when Infectious source is under control. Paroxysmal A-fib -EKG on admission shows sinus rhythm with LBBB. HR stable in the 70s and 80s. -Continue Anticoagulation with Apixaban 5 mg po BID  and Metoprolol Succinate 25 mg po Daily -C/w Telemetry -Check Mag and Phos   Hypomagnesemia -Magnesium is 1.7 today. -  Replete with IV Mag Sulfate 4 Grams -Continue to Monitor and Replete as Necessary -Follow magnesium    HTN SBP in 140's and DBP in the 40's to 60  -Continue amlodipine and Toprol XL   Diabetes Mellitus Type 2 On metformin at home. Last A1c 5.9% 5 months ago. -SSI with meals   Normocytic Anemia with iron deficiency anemia -Hgb/Hct Trend: -iron deficiency is present. Iron will be supplemented when infection is under control. -Continue to Monitor for S/Sx of Bleeding; No overt bleeding -Repeat CBC in the AM   Thrombocytosis -Platelet Count Trend:     Recent Labs  Lab 05/29/23 2237 05/30/23 0451  PLT 153 125*  -Continue to Monitor and Trend and Repeat CBC in the AM   Hypoalbuminemia -Patient's Albumin Trend:    Recent Labs  Lab 05/29/23 2237  ALBUMIN 2.8*  -Continue to Monitor and Trend and repeat CMP in the AM   Overweight -Complicates overall prognosis and care -Estimated body mass index is 28.98 kg/m as calculated from the following:   Height as of 12/02/22: 5\' 5"  (1.651 m).   Weight as of this encounter: 79 kg.  -Weight Loss and Dietary Counseling given   DVT prophylaxis:  apixaban (ELIQUIS) tablet 5 mg    Code Status: Limited: Do not attempt resuscitation (DNR) -DNR-LIMITED -Do Not Intubate/DNI  Family Communication: Discussed with the patient's daughter [name] over the telephone   Disposition Plan:  Level of care: Progressive Status is: Inpatient Remains inpatient appropriate because: Needs further clinical improvement and susceptibilities to safely discharged on p.o.   Consultants:  Neurology   Procedures:  As delineated as above   Antimicrobials:  Anti-infectives(From admission, onward)      Antimicrobials:  Anti-infectives (From admission, onward)    Start     Dose/Rate Route Frequency Ordered Stop   06/01/23 1015  meropenem (MERREM) 1 g in sodium chloride 0.9 % 100 mL IVPB        1 g 200 mL/hr over 30 Minutes Intravenous Every 12 hours 06/01/23 0917     05/29/23 2345  cefTRIAXone (ROCEPHIN) 1 g in sodium chloride 0.9 % 100 mL IVPB  Status:   Discontinued        1 g 200 mL/hr over 30 Minutes Intravenous Every 24 hours 05/29/23 2336 06/01/23 0917       Subjective: The patient is resting comfortably. No new complaints.   Objective: Vitals:   06/02/23 0359 06/02/23 0820 06/02/23 1223 06/02/23 1525  BP: (!) 144/68 (!) 149/70 (!) 153/64 (!) 140/57  Pulse: 68 64 70 67  Resp: 17 16 18 18   Temp: 98 F (36.7 C) 97.8 F (36.6 C) (!) 97.4 F (36.3 C) 97.9 F (36.6 C)  TempSrc:  Oral Oral Oral  SpO2: 96% 94% 96% 93%  Weight:      Height:        Intake/Output Summary (Last 24 hours) at 06/02/2023 1925 Last data filed at 06/02/2023 1800 Gross per 24 hour  Intake 581 ml  Output 1280 ml  Net -699 ml   Filed Weights   05/29/23 2200  Weight: 79 kg   Examination: Physical Exam:  Exam:  Constitutional:  The patient is awake, alert, and oriented x 3. No acute distress. Respiratory:  No increased work of breathing. No wheezes, rales, or rhonchi No tactile fremitus Cardiovascular:  Regular rate and rhythm No murmurs, ectopy, or gallups. No lateral PMI. No thrills. Abdomen:  Abdomen is soft, non-tender, non-distended No hernias, masses, or organomegaly Normoactive bowel  sounds.  Musculoskeletal:  No cyanosis, clubbing, or edema Skin:  No rashes, lesions, ulcers palpation of skin: no induration or nodules Neurologic:  CN 2-12 intact Sensation all 4 extremities intact Psychiatric:  Mental status Mood, affect appropriate Orientation to person, place, time  judgment and insight appear intact   Data Reviewed: I have personally reviewed following labs and imaging studies  CBC: Recent Labs  Lab 05/29/23 2237 05/30/23 0451 05/31/23 0538 06/01/23 0558 06/02/23 0612  WBC 8.4 7.3 6.1 6.0 6.8  NEUTROABS 5.7  --  3.9 3.7 4.8  HGB 11.1*  12.2 10.0* 9.8* 10.6* 10.7*  HCT 36.8  36.0 32.7* 31.9* 35.0* 34.7*  MCV 84.8 83.6 82.9 83.1 83.4  PLT 153 125* 137* 151 153   Basic Metabolic Panel: Recent Labs  Lab  05/29/23 2237 05/30/23 0451 05/31/23 0538 06/01/23 0558 06/02/23 0612  NA 140  140 140 138 140 139  K 3.7  3.7 4.1 3.3* 4.4 4.1  CL 100  101 96* 100 102 97*  CO2 23 26 29 30 30   GLUCOSE 122*  123* 120* 110* 109* 117*  BUN 12  12 10 11 8  7*  CREATININE 0.67  0.70 0.58 0.53 0.69 0.61  CALCIUM 8.9 8.8* 8.6* 9.0 9.3  MG  --  1.2* 2.0 1.7  --   PHOS  --  2.5 3.3 3.0  --    GFR: Estimated Creatinine Clearance: 58.3 mL/min (by C-G formula based on SCr of 0.61 mg/dL). Liver Function Tests: Recent Labs  Lab 05/29/23 2237 05/31/23 0538 06/01/23 0558  AST 23 18 28   ALT 13 11 14   ALKPHOS 100 92 89  BILITOT 0.8 0.8 0.7  PROT 7.3 6.4* 7.0  ALBUMIN 2.8* 2.5* 2.6*   No results for input(s): "LIPASE", "AMYLASE" in the last 168 hours. Recent Labs  Lab 05/29/23 2345  AMMONIA 17   Coagulation Profile: Recent Labs  Lab 05/29/23 2237  INR 1.2   Cardiac Enzymes: No results for input(s): "CKTOTAL", "CKMB", "CKMBINDEX", "TROPONINI" in the last 168 hours. BNP (last 3 results) No results for input(s): "PROBNP" in the last 8760 hours. HbA1C: No results for input(s): "HGBA1C" in the last 72 hours. CBG: Recent Labs  Lab 06/01/23 1622 06/01/23 2116 06/02/23 0638 06/02/23 1219 06/02/23 1738  GLUCAP 105* 130* 117* 180* 120*   Lipid Profile: No results for input(s): "CHOL", "HDL", "LDLCALC", "TRIG", "CHOLHDL", "LDLDIRECT" in the last 72 hours. Thyroid Function Tests: No results for input(s): "TSH", "T4TOTAL", "FREET4", "T3FREE", "THYROIDAB" in the last 72 hours. Anemia Panel: Recent Labs    05/31/23 1703 06/01/23 0558  VITAMINB12  --  972*  FOLATE  --  5.4*  FERRITIN  --  23  TIBC 232*  --   IRON 22*  --   RETICCTPCT  --  1.2   Sepsis Labs: No results for input(s): "PROCALCITON", "LATICACIDVEN" in the last 168 hours.  Recent Results (from the past 240 hours)  Urine Culture     Status: Abnormal   Collection Time: 05/29/23 11:08 PM   Specimen: In/Out Cath Urine   Result Value Ref Range Status   Specimen Description IN/OUT CATH URINE  Final   Special Requests   Final    NONE Performed at [facility], [address], [place], [place] [address]    Culture (A)  Final    >=100,000 COLONIES/mL ESCHERICHIA COLI Confirmed Extended Spectrum Beta-Lactamase Producer (ESBL).  In bloodstream infections from ESBL organisms, carbapenems are preferred over piperacillin/tazobactam. They are shown to  have a lower risk of mortality.    Report Status 06/01/2023 FINAL  Final   Organism ID, Bacteria ESCHERICHIA COLI (A)  Final      Susceptibility   Escherichia coli - MIC*    AMPICILLIN >=32 RESISTANT Resistant     CEFAZOLIN >=64 RESISTANT Resistant     CEFEPIME 16 RESISTANT Resistant     CEFTRIAXONE >=64 RESISTANT Resistant     CIPROFLOXACIN >=4 RESISTANT Resistant     GENTAMICIN >=16 RESISTANT Resistant     IMIPENEM <=0.25 SENSITIVE Sensitive     NITROFURANTOIN 64 INTERMEDIATE Intermediate     TRIMETH/SULFA <=20 SENSITIVE Sensitive     AMPICILLIN/SULBACTAM >=32 RESISTANT Resistant     PIP/TAZO 16 SENSITIVE Sensitive ug/mL    * >=100,000 COLONIES/mL ESCHERICHIA COLI  Urine Culture (for pregnant, neutropenic or urologic patients or patients with an indwelling urinary catheter)     Status: Abnormal   Collection Time: 05/30/23  9:50 AM   Specimen: In/Out Cath Urine  Result Value Ref Range Status   Specimen Description IN/OUT CATH URINE  Final   Special Requests   Final    NONE Performed at [facility], [address], [place], [place] [address]    Culture (A)  Final    40,000 COLONIES/mL ESCHERICHIA COLI Confirmed Extended Spectrum Beta-Lactamase Producer (ESBL).  In bloodstream infections from ESBL organisms, carbapenems are preferred over piperacillin/tazobactam. They are shown to have a lower risk of mortality.    Report Status 06/01/2023 FINAL  Final   Organism ID, Bacteria ESCHERICHIA COLI (A)  Final      Susceptibility   Escherichia  coli - MIC*    AMPICILLIN >=32 RESISTANT Resistant     CEFAZOLIN >=64 RESISTANT Resistant     CEFEPIME 16 RESISTANT Resistant     CEFTRIAXONE >=64 RESISTANT Resistant     CIPROFLOXACIN >=4 RESISTANT Resistant     GENTAMICIN >=16 RESISTANT Resistant     IMIPENEM <=0.25 SENSITIVE Sensitive     NITROFURANTOIN 64 INTERMEDIATE Intermediate     TRIMETH/SULFA <=20 SENSITIVE Sensitive     AMPICILLIN/SULBACTAM >=32 RESISTANT Resistant     PIP/TAZO 8 SENSITIVE Sensitive ug/mL    * 40,000 COLONIES/mL ESCHERICHIA COLI  MRSA Next Gen by PCR, Nasal     Status: Abnormal   Collection Time: 06/02/23 11:02 AM   Specimen: Nasal Mucosa; Nasal Swab  Result Value Ref Range Status   MRSA by PCR Next Gen DETECTED (A) NOT DETECTED Final    Comment: RESULT CALLED TO, READ BACK BY AND VERIFIED WITH: RN Tonita Phoenix [phone] @ [phone] FH (NOTE) The GeneXpert MRSA Assay (FDA approved for NASAL specimens only), is one component of a comprehensive MRSA colonization surveillance program. It is not intended to diagnose MRSA infection nor to guide or monitor treatment for MRSA infections. Test performance is not FDA approved in patients less than 32 years old. Performed at [facility], [address], [place], [place] [address]     Radiology Studies: No results found.  Scheduled Meds:  amLODipine  10 mg Oral Daily   apixaban  5 mg Oral BID   Chlorhexidine Gluconate Cloth  6 each Topical Daily   feeding supplement  237 mL Oral BID BM   FLUoxetine  20 mg Oral Daily   insulin aspart  0-15 Units Subcutaneous TID WC   metoprolol succinate  25 mg Oral Daily   mupirocin ointment  1 Application Nasal BID   Continuous Infusions:  meropenem (MERREM) IV Stopped (06/02/23  1054)    LOS: 3 days   Ashante Snelling, DO [facility] Available via Epic secure chat 7am-7pm After these hours, please refer to coverage provider listed on [email or web address] 06/02/2023, 7:25 PM

## 2023-06-03 DIAGNOSIS — G9340 Encephalopathy, unspecified: Secondary | ICD-10-CM | POA: Diagnosis not present

## 2023-06-03 LAB — CBC WITH DIFFERENTIAL/PLATELET
Abs Immature Granulocytes: 0.03 10*3/uL (ref 0.00–0.07)
Basophils Absolute: 0 10*3/uL (ref 0.0–0.1)
Basophils Relative: 0 %
Eosinophils Absolute: 0.1 10*3/uL (ref 0.0–0.5)
Eosinophils Relative: 2 %
HCT: 38.4 % (ref 36.0–46.0)
Hemoglobin: 11.6 g/dL — ABNORMAL LOW (ref 12.0–15.0)
Immature Granulocytes: 0 %
Lymphocytes Relative: 26 %
Lymphs Abs: 1.8 10*3/uL (ref 0.7–4.0)
MCH: 25.4 pg — ABNORMAL LOW (ref 26.0–34.0)
MCHC: 30.2 g/dL (ref 30.0–36.0)
MCV: 84 fL (ref 80.0–100.0)
Monocytes Absolute: 0.4 10*3/uL (ref 0.1–1.0)
Monocytes Relative: 6 %
Neutro Abs: 4.6 10*3/uL (ref 1.7–7.7)
Neutrophils Relative %: 66 %
Platelets: 187 10*3/uL (ref 150–400)
RBC: 4.57 MIL/uL (ref 3.87–5.11)
RDW: 16.1 % — ABNORMAL HIGH (ref 11.5–15.5)
WBC: 6.9 10*3/uL (ref 4.0–10.5)
nRBC: 0 % (ref 0.0–0.2)

## 2023-06-03 LAB — GLUCOSE, CAPILLARY
Glucose-Capillary: 130 mg/dL — ABNORMAL HIGH (ref 70–99)
Glucose-Capillary: 129 mg/dL — ABNORMAL HIGH (ref 70–99)
Glucose-Capillary: 114 mg/dL — ABNORMAL HIGH (ref 70–99)
Glucose-Capillary: 132 mg/dL — ABNORMAL HIGH (ref 70–99)

## 2023-06-03 LAB — BASIC METABOLIC PANEL
Anion gap: 8 (ref 5–15)
BUN: 9 mg/dL (ref 8–23)
CO2: 30 mmol/L (ref 22–32)
Calcium: 9.1 mg/dL (ref 8.9–10.3)
Chloride: 100 mmol/L (ref 98–111)
Creatinine, Ser: 0.59 mg/dL (ref 0.44–1.00)
GFR, Estimated: >60 mL/min (ref >60)
Glucose, Bld: 128 mg/dL — ABNORMAL HIGH (ref 70–99)
Potassium: 4.1 mmol/L (ref 3.5–5.1)
Sodium: 138 mmol/L (ref 135–145)

## 2023-06-03 MED ORDER — SODIUM CHLORIDE 0.9 % IV SOLN
1.0000 g | Freq: Three times a day (TID) | INTRAVENOUS | Status: AC
  Administered 2023-06-03 – 2023-06-05 (×7): 1 g via INTRAVENOUS
  Filled 2023-06-03 (×7): qty 20

## 2023-06-03 NOTE — Progress Notes (Signed)
 PROGRESS NOTE    Jeanette [name]  ZOX:[id number] DOB: 1940/10/24 DOA: 05/29/2023 PCP: Crist Fat, MD   Brief Narrative:  The patient is an overweight 83 year old Caucasian female with a past medical history significant for but not limited to atrial fibrillation on anticoagulation with Eliquis, hypertension, hyperlipidemia, chronic back pain, diabetes mellitus type 2 with diabetic neuropathy, asthma, degenerative disc disease history of UTIs and vertebral osteomyelitis and also history of impaired ambulation who is mostly wheelchair-bound presented to the hospital via EMS from SNF as a code stroke.  EMS was called to her facility due to decreased responsiveness, left-sided weakness and right-sided gaze.  Her last known well time is unknown and she was only alert and oriented to self.  Head CT and MRI were done and CT of the head was done and showed no acute intracranial abnormalities and CTA of the head and neck showed no LVO.  Neurology was consulted and MRI showed no acute intracranial abnormality.  EEG was then also done and pending read.  The neurology team felt that her altered mental status was in the setting of a UTI and recommended calling neurology back if EEG was abnormal or she is not returning to baseline with treatment of her toxic metabolic derangements.  Currently she is being admitted and treated for: acute metabolic encephalopathy, UTI (ESBL E. Coli), paroxysmal atrial fibrillation, hypomagnesemia, anemia, thrombocytopenia, hypoalbuminemia.  The plan is for the patient to discharge to SNF once the patient completes her treatment for ESBL e.coli UTI. She should complete her course on 06/05/2023. She has two9 more days of imipenem prior to discharge.   Assessment and Plan:   Acute Metabolic Encephalopathy -Patient presented from SNF as a code stroke due to unresponsiveness, left-sided weakness and right gaze preference.   -Head and neck imaging overall does not show any acute  intracranial abnormalities. -Normal ethanol, ammonia and UDS.  -UA shows signs of infection which is the most likely cause of her encephalopathy. -Neurology following, recommends further evaluation with a EEG. -Admit to progressive bed -Neurology consulted, recommends EEG and reconsult if abnormal -Follow-up EEG showed "This study is suggestive of cortical dysfunction arising from right hemisphere likely secondary to underlying structural abnormality. Additionally there is moderate diffuse encephalopathy. No seizures or epileptiform discharges were seen throughout the recording." -Management of UTI as below -Give IV NS at 100 cc/h for 10 hours until able to eat -PT/OT/SLP eval and treat and recommending SNF -Pt is currently tolerating a regular diet.  -Delirium precautions   Acute UTI Cystitis -Elderly patient with history of UTIs presented with acute encephalopathy and found to have evidence of infection on urinalysis. -Urinalysis done and showed a cloudy appearance with small hemoglobin, large leukocytes, positive nitrites, many bacteria, 21-50 RBCs per high-power field, greater than 50 WBCs; unfortunately there is no urinalysis obtained on admission so obtain 1 now but she received antibiotics already -Continue IV ceftriaxone -Urine culture has grown out ESBL E. Coli. She has been transitioned to Meropenem. She will need 5 days for her complicated UTI. -Continue monitor and trend CBC and fever curve; Current WBC:     Recent Labs  Lab 05/29/23 2237 05/30/23 0451  WBC 8.4 7.3     Iron Deficiency Anemia:' Will start iron supplementation when Infectious source is under control. Paroxysmal A-fib -EKG on admission shows sinus rhythm with LBBB. HR stable in the 70s and 80s. -Continue Anticoagulation with Apixaban 5 mg po BID  and Metoprolol Succinate 25 mg po Daily -C/w Telemetry -  Check Mag and Phos   Hypomagnesemia -Magnesium is 1.7 today. -Replete with IV Mag Sulfate 4  Grams -Continue to Monitor and Replete as Necessary -Follow magnesium   HTN SBP in 140's and DBP in the 40's to 60  -Continue amlodipine and Toprol XL   Diabetes Mellitus Type 2 On metformin at home. Last A1c 5.9% 5 months ago. -SSI with meals   Normocytic Anemia with iron deficiency anemia -Hgb/Hct Trend: -iron deficiency is present. Iron will be supplemented when infection is under control. -Continue to Monitor for S/Sx of Bleeding; No overt bleeding -Repeat CBC in the AM   Thrombocytosis -Platelet Count Trend:     Recent Labs  Lab 05/29/23 2237 05/30/23 0451  PLT 153 125*  -Continue to Monitor and Trend and Repeat CBC in the AM   Hypoalbuminemia -Patient's Albumin Trend:    Recent Labs  Lab 05/29/23 2237  ALBUMIN 2.8*  -Continue to Monitor and Trend and repeat CMP in the AM   Overweight -Complicates overall prognosis and care -Estimated body mass index is 28.98 kg/m as calculated from the following:   Height as of 12/02/22: 5\' 5"  (1.651 m).   Weight as of this encounter: 79 kg.  -Weight Loss and Dietary Counseling given   DVT prophylaxis:  apixaban (ELIQUIS) tablet 5 mg    Code Status: Limited: Do not attempt resuscitation (DNR) -DNR-LIMITED -Do Not Intubate/DNI  Family Communication: Discussed with the patient's daughter [name] over the telephone   Disposition Plan:  Level of care: Progressive Status is: Inpatient Remains inpatient appropriate because: Needs further clinical improvement and susceptibilities to safely discharged on p.o.   Consultants:  Neurology   Procedures:  As delineated as above   Antimicrobials:  Anti-infectives(From admission, onward)      Antimicrobials:  Anti-infectives (From admission, onward)    Start     Dose/Rate Route Frequency Ordered Stop   06/03/23 2200  meropenem (MERREM) 1 g in sodium chloride 0.9 % 100 mL IVPB        1 g 200 mL/hr over 30 Minutes Intravenous Every 8 hours 06/03/23 1515 06/06/23 0559    06/01/23 1015  meropenem (MERREM) 1 g in sodium chloride 0.9 % 100 mL IVPB  Status:  Discontinued        1 g 200 mL/hr over 30 Minutes Intravenous Every 12 hours 06/01/23 0917 06/03/23 1515   05/29/23 2345  cefTRIAXone (ROCEPHIN) 1 g in sodium chloride 0.9 % 100 mL IVPB  Status:  Discontinued        1 g 200 mL/hr over 30 Minutes Intravenous Every 24 hours 05/29/23 2336 06/01/23 0917       Subjective: The patient is resting comfortably. No new complaints.   Objective: Vitals:   06/03/23 0414 06/03/23 0730 06/03/23 1242 06/03/23 1508  BP: (!) 146/65 (!) 153/61 133/62 (!) 152/63  Pulse:  64 65 73  Resp:  20 20 20   Temp: 98 F (36.7 C) (!) 97.5 F (36.4 C) 98.2 F (36.8 C) (!) 97.5 F (36.4 C)  TempSrc: Oral Oral Oral Oral  SpO2: 93% 94% 96% 96%  Weight:      Height:        Intake/Output Summary (Last 24 hours) at 06/03/2023 1756 Last data filed at 06/03/2023 1246 Gross per 24 hour  Intake 880 ml  Output 750 ml  Net 130 ml   Filed Weights   05/29/23 2200  Weight: 79 kg   Examination: Physical Exam:  Exam:  Constitutional:  The patient is  awake, alert, and oriented x 3. No acute distress. Respiratory:  No increased work of breathing. No wheezes, rales, or rhonchi No tactile fremitus Cardiovascular:  Regular rate and rhythm No murmurs, ectopy, or gallups. No lateral PMI. No thrills. Abdomen:  Abdomen is soft, non-tender, non-distended No hernias, masses, or organomegaly Normoactive bowel sounds.  Musculoskeletal:  No cyanosis, clubbing, or edema Skin:  No rashes, lesions, ulcers palpation of skin: no induration or nodules Neurologic:  CN 2-12 intact Sensation all 4 extremities intact Psychiatric:  Mental status Mood, affect appropriate Orientation to person, place, time  judgment and insight appear intact   Data Reviewed: I have personally reviewed following labs and imaging studies  CBC: Recent Labs  Lab 05/29/23 2237 05/30/23 0451  05/31/23 0538 06/01/23 0558 06/02/23 0612 06/03/23 0759  WBC 8.4 7.3 6.1 6.0 6.8 6.9  NEUTROABS 5.7  --  3.9 3.7 4.8 4.6  HGB 11.1*  12.2 10.0* 9.8* 10.6* 10.7* 11.6*  HCT 36.8  36.0 32.7* 31.9* 35.0* 34.7* 38.4  MCV 84.8 83.6 82.9 83.1 83.4 84.0  PLT 153 125* 137* 151 153 187   Basic Metabolic Panel: Recent Labs  Lab 05/30/23 0451 05/31/23 0538 06/01/23 0558 06/02/23 0612 06/03/23 0759  NA 140 138 140 139 138  K 4.1 3.3* 4.4 4.1 4.1  CL 96* 100 102 97* 100  CO2 26 29 30 30 30   GLUCOSE 120* 110* 109* 117* 128*  BUN 10 11 8  7* 9  CREATININE 0.58 0.53 0.69 0.61 0.59  CALCIUM 8.8* 8.6* 9.0 9.3 9.1  MG 1.2* 2.0 1.7  --   --   PHOS 2.5 3.3 3.0  --   --    GFR: Estimated Creatinine Clearance: 58.3 mL/min (by C-G formula based on SCr of 0.59 mg/dL). Liver Function Tests: Recent Labs  Lab 05/29/23 2237 05/31/23 0538 06/01/23 0558  AST 23 18 28   ALT 13 11 14   ALKPHOS 100 92 89  BILITOT 0.8 0.8 0.7  PROT 7.3 6.4* 7.0  ALBUMIN 2.8* 2.5* 2.6*   No results for input(s): "LIPASE", "AMYLASE" in the last 168 hours. Recent Labs  Lab 05/29/23 2345  AMMONIA 17   Coagulation Profile: Recent Labs  Lab 05/29/23 2237  INR 1.2   Cardiac Enzymes: No results for input(s): "CKTOTAL", "CKMB", "CKMBINDEX", "TROPONINI" in the last 168 hours. BNP (last 3 results) No results for input(s): "PROBNP" in the last 8760 hours. HbA1C: No results for input(s): "HGBA1C" in the last 72 hours. CBG: Recent Labs  Lab 06/02/23 1738 06/02/23 2124 06/03/23 0613 06/03/23 1243 06/03/23 1733  GLUCAP 120* 155* 130* 129* 114*   Lipid Profile: No results for input(s): "CHOL", "HDL", "LDLCALC", "TRIG", "CHOLHDL", "LDLDIRECT" in the last 72 hours. Thyroid Function Tests: No results for input(s): "TSH", "T4TOTAL", "FREET4", "T3FREE", "THYROIDAB" in the last 72 hours. Anemia Panel: Recent Labs    06/01/23 0558  VITAMINB12 972*  FOLATE 5.4*  FERRITIN 23  RETICCTPCT 1.2   Sepsis  Labs: No results for input(s): "PROCALCITON", "LATICACIDVEN" in the last 168 hours.  Recent Results (from the past 240 hours)  Urine Culture     Status: Abnormal   Collection Time: 05/29/23 11:08 PM   Specimen: In/Out Cath Urine  Result Value Ref Range Status   Specimen Description IN/OUT CATH URINE  Final   Special Requests   Final    NONE Performed at [facility], [address] [place], [place] [address]    Culture (A)  Final    >=100,000 COLONIES/mL  ESCHERICHIA COLI Confirmed Extended Spectrum Beta-Lactamase Producer (ESBL).  In bloodstream infections from ESBL organisms, carbapenems are preferred over piperacillin/tazobactam. They are shown to have a lower risk of mortality.    Report Status 06/01/2023 FINAL  Final   Organism ID, Bacteria ESCHERICHIA COLI (A)  Final      Susceptibility   Escherichia coli - MIC*    AMPICILLIN >=32 RESISTANT Resistant     CEFAZOLIN >=64 RESISTANT Resistant     CEFEPIME 16 RESISTANT Resistant     CEFTRIAXONE >=64 RESISTANT Resistant     CIPROFLOXACIN >=4 RESISTANT Resistant     GENTAMICIN >=16 RESISTANT Resistant     IMIPENEM <=0.25 SENSITIVE Sensitive     NITROFURANTOIN 64 INTERMEDIATE Intermediate     TRIMETH/SULFA <=20 SENSITIVE Sensitive     AMPICILLIN/SULBACTAM >=32 RESISTANT Resistant     PIP/TAZO 16 SENSITIVE Sensitive ug/mL    * >=100,000 COLONIES/mL ESCHERICHIA COLI  Urine Culture (for pregnant, neutropenic or urologic patients or patients with an indwelling urinary catheter)     Status: Abnormal   Collection Time: 05/30/23  9:50 AM   Specimen: In/Out Cath Urine  Result Value Ref Range Status   Specimen Description IN/OUT CATH URINE  Final   Special Requests   Final    NONE Performed at [facility], [address] [place], [place] [address]    Culture (A)  Final    40,000 COLONIES/mL ESCHERICHIA COLI Confirmed Extended Spectrum Beta-Lactamase Producer (ESBL).  In bloodstream infections from ESBL organisms,  carbapenems are preferred over piperacillin/tazobactam. They are shown to have a lower risk of mortality.    Report Status 06/01/2023 FINAL  Final   Organism ID, Bacteria ESCHERICHIA COLI (A)  Final      Susceptibility   Escherichia coli - MIC*    AMPICILLIN >=32 RESISTANT Resistant     CEFAZOLIN >=64 RESISTANT Resistant     CEFEPIME 16 RESISTANT Resistant     CEFTRIAXONE >=64 RESISTANT Resistant     CIPROFLOXACIN >=4 RESISTANT Resistant     GENTAMICIN >=16 RESISTANT Resistant     IMIPENEM <=0.25 SENSITIVE Sensitive     NITROFURANTOIN 64 INTERMEDIATE Intermediate     TRIMETH/SULFA <=20 SENSITIVE Sensitive     AMPICILLIN/SULBACTAM >=32 RESISTANT Resistant     PIP/TAZO 8 SENSITIVE Sensitive ug/mL    * 40,000 COLONIES/mL ESCHERICHIA COLI  MRSA Next Gen by PCR, Nasal     Status: Abnormal   Collection Time: 06/02/23 11:02 AM   Specimen: Nasal Mucosa; Nasal Swab  Result Value Ref Range Status   MRSA by PCR Next Gen DETECTED (A) NOT DETECTED Final    Comment: RESULT CALLED TO, READ BACK BY AND VERIFIED WITH: RN Tonita Phoenix [phone] @ [phone] (NOTE) The GeneXpert MRSA Assay (FDA approved for NASAL specimens only), is one component of a comprehensive MRSA colonization surveillance program. It is not intended to diagnose MRSA infection nor to guide or monitor treatment for MRSA infections. Test performance is not FDA approved in patients less than 12 years old. Performed at [facility], [address] [place], [place] [address]     Radiology Studies: No results found.  Scheduled Meds:  amLODipine  10 mg Oral Daily   apixaban  5 mg Oral BID   Chlorhexidine Gluconate Cloth  6 each Topical Daily   feeding supplement  237 mL Oral BID BM   FLUoxetine  20 mg Oral Daily   insulin aspart  0-15 Units Subcutaneous TID WC   metoprolol succinate  25 mg Oral Daily   mupirocin ointment  1 Application Nasal BID   Continuous Infusions:  meropenem (MERREM) IV      LOS: 4 days   Thunder [place], DO [facility] Available via Epic secure chat 7am-7pm After these hours, please refer to coverage provider listed on [email or web address] 06/03/2023, 5:56 PM

## 2023-06-03 NOTE — TOC Progression Note (Signed)
Transition of Care [facility] - Progression Note    Patient Details  Name: Jeanette [name] MRN: [id number] Date of Birth: 06-28-1940  [facility] CM/SW Contact  Baldemar Len[place], [place] Phone Number: 06/03/2023, 10:24 AM  Clinical Narrative:   CSW following for return to SNF. Patient not medically stable at this time, continuing on IV antibiotics. [facility] can admit patient on Monday if medically stable.    Expected Discharge Plan: Skilled Nursing Facility Barriers to Discharge: Continued Medical Work up  Expected Discharge Plan and Services     Post Acute Care Choice: Nursing Home Living arrangements for the past 2 months: Skilled Nursing Facility                                       Social Determinants of Health (SDOH) Interventions SDOH Screenings   Food Insecurity: No Food Insecurity (06/02/2023)  Housing: Low Risk  (06/02/2023)  Transportation Needs: No Transportation Needs (06/02/2023)  Utilities: Not At Risk (05/31/2023)  Depression (PHQ2-9): Low Risk  (06/23/2018)  Financial Resource Strain: Low Risk  (01/13/2022)   Received from [facility], [facility]  Social Connections: Unknown (05/31/2023)  Stress: No Stress Concern Present (01/13/2022)   Received from [facility], [facility]  Tobacco Use: Low Risk  (05/29/2023)    Readmission Risk Interventions     No data to display

## 2023-06-03 NOTE — Plan of Care (Signed)
  Problem: Education: Goal: Ability to describe self-care measures that may prevent or decrease complications (Diabetes Survival Skills Education) will improve Outcome: Not Progressing   Problem: Fluid Volume: Goal: Ability to maintain a balanced intake and output will improve Outcome: Progressing   Problem: Metabolic: Goal: Ability to maintain appropriate glucose levels will improve Outcome: Progressing   Problem: Nutritional: Goal: Maintenance of adequate nutrition will improve Outcome: Progressing Goal: Progress toward achieving an optimal weight will improve Outcome: Progressing   Problem: Skin Integrity: Goal: Risk for impaired skin integrity will decrease Outcome: Progressing   Problem: Tissue Perfusion: Goal: Adequacy of tissue perfusion will improve Outcome: Progressing

## 2023-06-04 DIAGNOSIS — G9340 Encephalopathy, unspecified: Secondary | ICD-10-CM | POA: Diagnosis not present

## 2023-06-04 LAB — GLUCOSE, CAPILLARY
Glucose-Capillary: 130 mg/dL — ABNORMAL HIGH (ref 70–99)
Glucose-Capillary: 171 mg/dL — ABNORMAL HIGH (ref 70–99)
Glucose-Capillary: 134 mg/dL — ABNORMAL HIGH (ref 70–99)
Glucose-Capillary: 138 mg/dL — ABNORMAL HIGH (ref 70–99)

## 2023-06-04 LAB — BASIC METABOLIC PANEL
Anion gap: 6 (ref 5–15)
BUN: 11 mg/dL (ref 8–23)
CO2: 28 mmol/L (ref 22–32)
Calcium: 8.9 mg/dL (ref 8.9–10.3)
Chloride: 105 mmol/L (ref 98–111)
Creatinine, Ser: 0.58 mg/dL (ref 0.44–1.00)
GFR, Estimated: >60 mL/min (ref >60)
Glucose, Bld: 120 mg/dL — ABNORMAL HIGH (ref 70–99)
Potassium: 3.9 mmol/L (ref 3.5–5.1)
Sodium: 139 mmol/L (ref 135–145)

## 2023-06-04 LAB — CBC WITH DIFFERENTIAL/PLATELET
Abs Immature Granulocytes: 0.01 10*3/uL (ref 0.00–0.07)
Basophils Absolute: 0 10*3/uL (ref 0.0–0.1)
Basophils Relative: 0 %
Eosinophils Absolute: 0.1 10*3/uL (ref 0.0–0.5)
Eosinophils Relative: 2 %
HCT: 33.8 % — ABNORMAL LOW (ref 36.0–46.0)
Hemoglobin: 10.2 g/dL — ABNORMAL LOW (ref 12.0–15.0)
Immature Granulocytes: 0 %
Lymphocytes Relative: 25 %
Lymphs Abs: 1.6 10*3/uL (ref 0.7–4.0)
MCH: 25.4 pg — ABNORMAL LOW (ref 26.0–34.0)
MCHC: 30.2 g/dL (ref 30.0–36.0)
MCV: 84.1 fL (ref 80.0–100.0)
Monocytes Absolute: 0.4 10*3/uL (ref 0.1–1.0)
Monocytes Relative: 7 %
Neutro Abs: 4.1 10*3/uL (ref 1.7–7.7)
Neutrophils Relative %: 66 %
Platelets: 138 10*3/uL — ABNORMAL LOW (ref 150–400)
RBC: 4.02 MIL/uL (ref 3.87–5.11)
RDW: 16.1 % — ABNORMAL HIGH (ref 11.5–15.5)
WBC: 6.3 10*3/uL (ref 4.0–10.5)
nRBC: 0 % (ref 0.0–0.2)

## 2023-06-04 NOTE — Plan of Care (Signed)
   Problem: Education: Goal: Ability to describe self-care measures that may prevent or decrease complications (Diabetes Survival Skills Education) will improve Outcome: Progressing   Problem: Coping: Goal: Ability to adjust to condition or change in health will improve Outcome: Progressing   Problem: Fluid Volume: Goal: Ability to maintain a balanced intake and output will improve Outcome: Progressing   Problem: Skin Integrity: Goal: Risk for impaired skin integrity will decrease Outcome: Progressing

## 2023-06-04 NOTE — Plan of Care (Signed)
  Problem: Coping: Goal: Ability to adjust to condition or change in health will improve Outcome: Progressing   Problem: Fluid Volume: Goal: Ability to maintain a balanced intake and output will improve Outcome: Progressing   Problem: Health Behavior/Discharge Planning: Goal: Ability to manage health-related needs will improve Outcome: Progressing   Problem: Metabolic: Goal: Ability to maintain appropriate glucose levels will improve Outcome: Progressing   Problem: Nutritional: Goal: Maintenance of adequate nutrition will improve Outcome: Progressing   Problem: Skin Integrity: Goal: Risk for impaired skin integrity will decrease Outcome: Progressing   Problem: Tissue Perfusion: Goal: Adequacy of tissue perfusion will improve Outcome: Progressing   Problem: Education: Goal: Knowledge of General Education information will improve Description: Including pain rating scale, medication(s)/side effects and non-pharmacologic comfort measures Outcome: Progressing   Problem: Clinical Measurements: Goal: Ability to maintain clinical measurements within normal limits will improve Outcome: Progressing Goal: Will remain free from infection Outcome: Progressing Goal: Diagnostic test results will improve Outcome: Progressing Goal: Respiratory complications will improve Outcome: Progressing Goal: Cardiovascular complication will be avoided Outcome: Progressing   Problem: Activity: Goal: Risk for activity intolerance will decrease Outcome: Progressing   Problem: Nutrition: Goal: Adequate nutrition will be maintained Outcome: Progressing   Problem: Coping: Goal: Level of anxiety will decrease Outcome: Progressing   Problem: Safety: Goal: Ability to remain free from injury will improve Outcome: Progressing

## 2023-06-04 NOTE — Progress Notes (Signed)
 PROGRESS NOTE    Jeanette [name]  ZOX:[id number] DOB: [birth year] DOA: 05/29/2023 PCP: Crist Fat, MD   Brief Narrative:  The patient is an overweight 83 year old Caucasian female with a past medical history significant for but not limited to atrial fibrillation on anticoagulation with Eliquis, hypertension, hyperlipidemia, chronic back pain, diabetes mellitus type 2 with diabetic neuropathy, asthma, degenerative disc disease history of UTIs and vertebral osteomyelitis and also history of impaired ambulation who is mostly wheelchair-bound presented to the hospital via EMS from SNF as a code stroke.  EMS was called to her facility due to decreased responsiveness, left-sided weakness and right-sided gaze.  Her last known well time is unknown and she was only alert and oriented to self.  Head CT and MRI were done and CT of the head was done and showed no acute intracranial abnormalities and CTA of the head and neck showed no LVO.  Neurology was consulted and MRI showed no acute intracranial abnormality.  EEG was then also done and pending read.  The neurology team felt that her altered mental status was in the setting of a UTI and recommended calling neurology back if EEG was abnormal or she is not returning to baseline with treatment of her toxic metabolic derangements.  Currently she is being admitted and treated for: acute metabolic encephalopathy, UTI (ESBL E. Coli), paroxysmal atrial fibrillation, hypomagnesemia, anemia, thrombocytopenia, hypoalbuminemia.  The plan is for the patient to discharge to SNF once the patient completes her treatment for ESBL e.coli UTI. She should complete her course on 06/05/2023. She has one more days of imipenem prior to discharge.  Plan is for discharge to SNF on 06/06/2023.   Assessment and Plan:   Acute Metabolic Encephalopathy -Patient presented from SNF as a code stroke due to unresponsiveness, left-sided weakness and right gaze preference.   -Head and neck  imaging overall does not show any acute intracranial abnormalities. -Normal ethanol, ammonia and UDS.  -UA shows signs of infection which is the most likely cause of her encephalopathy. -Neurology following, recommends further evaluation with a EEG. -Admit to progressive bed -Neurology consulted, recommends EEG and reconsult if abnormal -Follow-up EEG showed "This study is suggestive of cortical dysfunction arising from right hemisphere likely secondary to underlying structural abnormality. Additionally there is moderate diffuse encephalopathy. No seizures or epileptiform discharges were seen throughout the recording." -Management of UTI as below -Give IV NS at 100 cc/h for 10 hours until able to eat -PT/OT/SLP eval and treat and recommending SNF -Pt is currently tolerating a regular diet.  -Delirium precautions   Acute UTI Cystitis -Elderly patient with history of UTIs presented with acute encephalopathy and found to have evidence of infection on urinalysis. -Urinalysis done and showed a cloudy appearance with small hemoglobin, large leukocytes, positive nitrites, many bacteria, 21-50 RBCs per high-power field, greater than 50 WBCs; unfortunately there is no urinalysis obtained on admission so obtain 1 now but she received antibiotics already -Continue IV ceftriaxone -Urine culture has grown out ESBL E. Coli. She has been transitioned to Meropenem. She will need 5 days for her complicated UTI. -Continue monitor and trend CBC and fever curve; Current WBC:     Recent Labs  Lab 05/29/23 2237 05/30/23 0451  WBC 8.4 7.3     Iron Deficiency Anemia:' Will start iron supplementation when Infectious source is under control. Paroxysmal A-fib -EKG on admission shows sinus rhythm with LBBB. HR stable in the 70s and 80s. -Continue Anticoagulation with Apixaban 5 mg po BID  and Metoprolol Succinate 25 mg po Daily -C/w Telemetry -Check Mag and Phos   Hypomagnesemia -Magnesium is 1.7  today. -Replete with IV Mag Sulfate 4 Grams -Continue to Monitor and Replete as Necessary -Follow magnesium   HTN SBP in 140's and DBP in the 40's to 60  -Continue amlodipine and Toprol XL   Diabetes Mellitus Type 2 On metformin at home. Last A1c 5.9% 5 months ago. -SSI with meals   Normocytic Anemia with iron deficiency anemia -Hgb/Hct Trend: -iron deficiency is present. Iron will be supplemented when infection is under control. -Continue to Monitor for S/Sx of Bleeding; No overt bleeding -Repeat CBC in the AM   Thrombocytosis -Platelet Count Trend:     Recent Labs  Lab 05/29/23 2237 05/30/23 0451  PLT 153 125*  -Continue to Monitor and Trend and Repeat CBC in the AM   Hypoalbuminemia -Patient's Albumin Trend:    Recent Labs  Lab 05/29/23 2237  ALBUMIN 2.8*  -Continue to Monitor and Trend and repeat CMP in the AM   Overweight -Complicates overall prognosis and care -Estimated body mass index is 28.98 kg/m as calculated from the following:   Height as of 12/02/22: 5\' 5"  (1.651 m).   Weight as of this encounter: 79 kg.  -Weight Loss and Dietary Counseling given   DVT prophylaxis:  apixaban (ELIQUIS) tablet 5 mg    Code Status: Limited: Do not attempt resuscitation (DNR) -DNR-LIMITED -Do Not Intubate/DNI  Family Communication: Discussed with the patient's daughter [name] over the telephone   Disposition Plan:  Level of care: Progressive Status is: Inpatient Remains inpatient appropriate because: Needs further clinical improvement and susceptibilities to safely discharged on p.o.   Consultants:  Neurology   Procedures:  As delineated as above   Antimicrobials:  Anti-infectives(From admission, onward)      Antimicrobials:  Anti-infectives (From admission, onward)    Start     Dose/Rate Route Frequency Ordered Stop   06/03/23 2200  meropenem (MERREM) 1 g in sodium chloride 0.9 % 100 mL IVPB        1 g 200 mL/hr over 30 Minutes Intravenous Every 8  hours 06/03/23 1515 06/06/23 0759   06/01/23 1015  meropenem (MERREM) 1 g in sodium chloride 0.9 % 100 mL IVPB  Status:  Discontinued        1 g 200 mL/hr over 30 Minutes Intravenous Every 12 hours 06/01/23 0917 06/03/23 1515   05/29/23 2345  cefTRIAXone (ROCEPHIN) 1 g in sodium chloride 0.9 % 100 mL IVPB  Status:  Discontinued        1 g 200 mL/hr over 30 Minutes Intravenous Every 24 hours 05/29/23 2336 06/01/23 0917       Subjective: The patient is resting comfortably. No new complaints.   Objective: Vitals:   06/04/23 0355 06/04/23 0742 06/04/23 1120 06/04/23 1658  BP: (!) 157/61 (!) 151/82 (!) 141/56 135/62  Pulse: 76 69 65 82  Resp: 18 18 18 18   Temp:  97.6 F (36.4 C) 97.6 F (36.4 C) 98.4 F (36.9 C)  TempSrc:  Oral  Oral  SpO2: 97% 99% 95% 94%  Weight:      Height:        Intake/Output Summary (Last 24 hours) at 06/04/2023 1742 Last data filed at 06/04/2023 1330 Gross per 24 hour  Intake 460 ml  Output --  Net 460 ml   Filed Weights   05/29/23 2200  Weight: 79 kg   Examination: Physical Exam:  Exam:  Constitutional:  The patient is awake, alert, and oriented x 3. No acute distress. Respiratory:  No increased work of breathing. No wheezes, rales, or rhonchi No tactile fremitus Cardiovascular:  Regular rate and rhythm No murmurs, ectopy, or gallups. No lateral PMI. No thrills. Abdomen:  Abdomen is soft, non-tender, non-distended No hernias, masses, or organomegaly Normoactive bowel sounds.  Musculoskeletal:  No cyanosis, clubbing, or edema Skin:  No rashes, lesions, ulcers palpation of skin: no induration or nodules Neurologic:  CN 2-12 intact Sensation all 4 extremities intact Psychiatric:  Mental status Mood, affect appropriate Orientation to person, place, time  judgment and insight appear intact   Data Reviewed: I have personally reviewed following labs and imaging studies  CBC: Recent Labs  Lab 05/31/23 0538 06/01/23 0558  06/02/23 0612 06/03/23 0759 06/04/23 0609  WBC 6.1 6.0 6.8 6.9 6.3  NEUTROABS 3.9 3.7 4.8 4.6 4.1  HGB 9.8* 10.6* 10.7* 11.6* 10.2*  HCT 31.9* 35.0* 34.7* 38.4 33.8*  MCV 82.9 83.1 83.4 84.0 84.1  PLT 137* 151 153 187 138*   Basic Metabolic Panel: Recent Labs  Lab 05/30/23 0451 05/31/23 0538 06/01/23 0558 06/02/23 0612 06/03/23 0759 06/04/23 0609  NA 140 138 140 139 138 139  K 4.1 3.3* 4.4 4.1 4.1 3.9  CL 96* 100 102 97* 100 105  CO2 26 29 30 30 30 28   GLUCOSE 120* 110* 109* 117* 128* 120*  BUN 10 11 8  7* 9 11  CREATININE 0.58 0.53 0.69 0.61 0.59 0.58  CALCIUM 8.8* 8.6* 9.0 9.3 9.1 8.9  MG 1.2* 2.0 1.7  --   --   --   PHOS 2.5 3.3 3.0  --   --   --    GFR: Estimated Creatinine Clearance: 58.3 mL/min (by C-G formula based on SCr of 0.58 mg/dL). Liver Function Tests: Recent Labs  Lab 05/29/23 2237 05/31/23 0538 06/01/23 0558  AST 23 18 28   ALT 13 11 14   ALKPHOS 100 92 89  BILITOT 0.8 0.8 0.7  PROT 7.3 6.4* 7.0  ALBUMIN 2.8* 2.5* 2.6*   No results for input(s): "LIPASE", "AMYLASE" in the last 168 hours. Recent Labs  Lab 05/29/23 2345  AMMONIA 17   Coagulation Profile: Recent Labs  Lab 05/29/23 2237  INR 1.2   Cardiac Enzymes: No results for input(s): "CKTOTAL", "CKMB", "CKMBINDEX", "TROPONINI" in the last 168 hours. BNP (last 3 results) No results for input(s): "PROBNP" in the last 8760 hours. HbA1C: No results for input(s): "HGBA1C" in the last 72 hours. CBG: Recent Labs  Lab 06/03/23 1733 06/03/23 2110 06/04/23 0559 06/04/23 1116 06/04/23 1647  GLUCAP 114* 132* 130* 171* 134*   Lipid Profile: No results for input(s): "CHOL", "HDL", "LDLCALC", "TRIG", "CHOLHDL", "LDLDIRECT" in the last 72 hours. Thyroid Function Tests: No results for input(s): "TSH", "T4TOTAL", "FREET4", "T3FREE", "THYROIDAB" in the last 72 hours. Anemia Panel: No results for input(s): "VITAMINB12", "FOLATE", "FERRITIN", "TIBC", "IRON", "RETICCTPCT" in the last 72  hours.  Sepsis Labs: No results for input(s): "PROCALCITON", "LATICACIDVEN" in the last 168 hours.  Recent Results (from the past 240 hours)  Urine Culture     Status: Abnormal   Collection Time: 05/29/23 11:08 PM   Specimen: In/Out Cath Urine  Result Value Ref Range Status   Specimen Description IN/OUT CATH URINE  Final   Special Requests   Final    NONE Performed at [facility], [address], [place], [place] [address]    Culture (A)  Final    >=100,000 COLONIES/mL  ESCHERICHIA COLI Confirmed Extended Spectrum Beta-Lactamase Producer (ESBL).  In bloodstream infections from ESBL organisms, carbapenems are preferred over piperacillin/tazobactam. They are shown to have a lower risk of mortality.    Report Status 06/01/2023 FINAL  Final   Organism ID, Bacteria ESCHERICHIA COLI (A)  Final      Susceptibility   Escherichia coli - MIC*    AMPICILLIN >=32 RESISTANT Resistant     CEFAZOLIN >=64 RESISTANT Resistant     CEFEPIME 16 RESISTANT Resistant     CEFTRIAXONE >=64 RESISTANT Resistant     CIPROFLOXACIN >=4 RESISTANT Resistant     GENTAMICIN >=16 RESISTANT Resistant     IMIPENEM <=0.25 SENSITIVE Sensitive     NITROFURANTOIN 64 INTERMEDIATE Intermediate     TRIMETH/SULFA <=20 SENSITIVE Sensitive     AMPICILLIN/SULBACTAM >=32 RESISTANT Resistant     PIP/TAZO 16 SENSITIVE Sensitive ug/mL    * >=100,000 COLONIES/mL ESCHERICHIA COLI  Urine Culture (for pregnant, neutropenic or urologic patients or patients with an indwelling urinary catheter)     Status: Abnormal   Collection Time: 05/30/23  9:50 AM   Specimen: In/Out Cath Urine  Result Value Ref Range Status   Specimen Description IN/OUT CATH URINE  Final   Special Requests   Final    NONE Performed at [facility], [address], [place], [place] [address]    Culture (A)  Final    40,000 COLONIES/mL ESCHERICHIA COLI Confirmed Extended Spectrum Beta-Lactamase Producer (ESBL).  In bloodstream infections from ESBL  organisms, carbapenems are preferred over piperacillin/tazobactam. They are shown to have a lower risk of mortality.    Report Status 06/01/2023 FINAL  Final   Organism ID, Bacteria ESCHERICHIA COLI (A)  Final      Susceptibility   Escherichia coli - MIC*    AMPICILLIN >=32 RESISTANT Resistant     CEFAZOLIN >=64 RESISTANT Resistant     CEFEPIME 16 RESISTANT Resistant     CEFTRIAXONE >=64 RESISTANT Resistant     CIPROFLOXACIN >=4 RESISTANT Resistant     GENTAMICIN >=16 RESISTANT Resistant     IMIPENEM <=0.25 SENSITIVE Sensitive     NITROFURANTOIN 64 INTERMEDIATE Intermediate     TRIMETH/SULFA <=20 SENSITIVE Sensitive     AMPICILLIN/SULBACTAM >=32 RESISTANT Resistant     PIP/TAZO 8 SENSITIVE Sensitive ug/mL    * 40,000 COLONIES/mL ESCHERICHIA COLI  MRSA Next Gen by PCR, Nasal     Status: Abnormal   Collection Time: 06/02/23 11:02 AM   Specimen: Nasal Mucosa; Nasal Swab  Result Value Ref Range Status   MRSA by PCR Next Gen DETECTED (A) NOT DETECTED Final    Comment: RESULT CALLED TO, READ BACK BY AND VERIFIED WITH: RN Tonita Phoenix [phone] @ [phone] (NOTE) The GeneXpert MRSA Assay (FDA approved for NASAL specimens only), is one component of a comprehensive MRSA colonization surveillance program. It is not intended to diagnose MRSA infection nor to guide or monitor treatment for MRSA infections. Test performance is not FDA approved in patients less than 81 years old. Performed at [facility], [address] [place], [place] [address]     Radiology Studies: No results found.  Scheduled Meds:  amLODipine  10 mg Oral Daily   apixaban  5 mg Oral BID   Chlorhexidine Gluconate Cloth  6 each Topical Daily   feeding supplement  237 mL Oral BID BM   FLUoxetine  20 mg Oral Daily   insulin aspart  0-15 Units Subcutaneous TID WC   metoprolol succinate  25 mg Oral Daily   mupirocin ointment  1 Application Nasal BID   Continuous Infusions:  meropenem (MERREM) IV 1 g (06/04/23  1604)    LOS: 5 days   Dolores Ewing, DO [facility] Available via Epic secure chat 7am-7pm After these hours, please refer to coverage provider listed on [email or web address] 06/04/2023, 5:42 PM

## 2023-06-05 DIAGNOSIS — G9340 Encephalopathy, unspecified: Secondary | ICD-10-CM | POA: Diagnosis not present

## 2023-06-05 LAB — CBC WITH DIFFERENTIAL/PLATELET
Abs Immature Granulocytes: 0.03 10*3/uL (ref 0.00–0.07)
Basophils Absolute: 0 10*3/uL (ref 0.0–0.1)
Basophils Relative: 1 %
Eosinophils Absolute: 0.1 10*3/uL (ref 0.0–0.5)
Eosinophils Relative: 1 %
HCT: 31.8 % — ABNORMAL LOW (ref 36.0–46.0)
Hemoglobin: 9.5 g/dL — ABNORMAL LOW (ref 12.0–15.0)
Immature Granulocytes: 1 %
Lymphocytes Relative: 25 %
Lymphs Abs: 1.6 10*3/uL (ref 0.7–4.0)
MCH: 25.2 pg — ABNORMAL LOW (ref 26.0–34.0)
MCHC: 29.9 g/dL — ABNORMAL LOW (ref 30.0–36.0)
MCV: 84.4 fL (ref 80.0–100.0)
Monocytes Absolute: 0.4 10*3/uL (ref 0.1–1.0)
Monocytes Relative: 7 %
Neutro Abs: 4.2 10*3/uL (ref 1.7–7.7)
Neutrophils Relative %: 65 %
Platelets: 153 10*3/uL (ref 150–400)
RBC: 3.77 MIL/uL — ABNORMAL LOW (ref 3.87–5.11)
RDW: 16.4 % — ABNORMAL HIGH (ref 11.5–15.5)
WBC: 6.4 10*3/uL (ref 4.0–10.5)
nRBC: 0 % (ref 0.0–0.2)

## 2023-06-05 LAB — BASIC METABOLIC PANEL
Anion gap: 10 (ref 5–15)
BUN: 16 mg/dL (ref 8–23)
CO2: 29 mmol/L (ref 22–32)
Calcium: 9 mg/dL (ref 8.9–10.3)
Chloride: 103 mmol/L (ref 98–111)
Creatinine, Ser: 0.74 mg/dL (ref 0.44–1.00)
GFR, Estimated: >60 mL/min (ref >60)
Glucose, Bld: 162 mg/dL — ABNORMAL HIGH (ref 70–99)
Potassium: 3.8 mmol/L (ref 3.5–5.1)
Sodium: 142 mmol/L (ref 135–145)

## 2023-06-05 LAB — GLUCOSE, CAPILLARY
Glucose-Capillary: 170 mg/dL — ABNORMAL HIGH (ref 70–99)
Glucose-Capillary: 146 mg/dL — ABNORMAL HIGH (ref 70–99)
Glucose-Capillary: 147 mg/dL — ABNORMAL HIGH (ref 70–99)
Glucose-Capillary: 236 mg/dL — ABNORMAL HIGH (ref 70–99)

## 2023-06-05 NOTE — Progress Notes (Signed)
 PROGRESS NOTE    Jeanette [name]  LKG:[id number] DOB: 04-25-1940 DOA: 05/29/2023 PCP: Crist Fat, MD   Brief Narrative:  The patient is an overweight 83 year old Caucasian female with a past medical history significant for but not limited to atrial fibrillation on anticoagulation with Eliquis, hypertension, hyperlipidemia, chronic back pain, diabetes mellitus type 2 with diabetic neuropathy, asthma, degenerative disc disease history of UTIs and vertebral osteomyelitis and also history of impaired ambulation who is mostly wheelchair-bound presented to the hospital via EMS from SNF as a code stroke.  EMS was called to her facility due to decreased responsiveness, left-sided weakness and right-sided gaze.  Her last known well time is unknown and she was only alert and oriented to self.  Head CT and MRI were done and CT of the head was done and showed no acute intracranial abnormalities and CTA of the head and neck showed no LVO.  Neurology was consulted and MRI showed no acute intracranial abnormality.  EEG was then also done and pending read.  The neurology team felt that her altered mental status was in the setting of a UTI and recommended calling neurology back if EEG was abnormal or she is not returning to baseline with treatment of her toxic metabolic derangements.  Currently she is being admitted and treated for: acute metabolic encephalopathy, UTI (ESBL E. Coli), paroxysmal atrial fibrillation, hypomagnesemia, anemia, thrombocytopenia, hypoalbuminemia.  The plan is for the patient to discharge to SNF once the patient completes her treatment for ESBL e.coli UTI. She should complete her course on 06/05/2023. She has one more days of imipenem prior to discharge.  The patient is complaining of headache, not resolved with tylenol. Will give her a one time dose of hydrocodone to address this.  Plan is for discharge to SNF on 06/06/2023.   Assessment and Plan:   Acute Metabolic  Encephalopathy -Patient presented from SNF as a code stroke due to unresponsiveness, left-sided weakness and right gaze preference.   -Head and neck imaging overall does not show any acute intracranial abnormalities. -Normal ethanol, ammonia and UDS.  -UA shows signs of infection which is the most likely cause of her encephalopathy. -Neurology following, recommends further evaluation with a EEG. -Admit to progressive bed -Neurology consulted, recommends EEG and reconsult if abnormal -Follow-up EEG showed "This study is suggestive of cortical dysfunction arising from right hemisphere likely secondary to underlying structural abnormality. Additionally there is moderate diffuse encephalopathy. No seizures or epileptiform discharges were seen throughout the recording." -Management of UTI as below -Give IV NS at 100 cc/h for 10 hours until able to eat -PT/OT/SLP eval and treat and recommending SNF -Pt is currently tolerating a regular diet.  -Delirium precautions  Headache - Tylenol not reducing the pain. One time dose of hydrocodone has been ordered.   Acute UTI Cystitis -Elderly patient with history of UTIs presented with acute encephalopathy and found to have evidence of infection on urinalysis. -Urinalysis done and showed a cloudy appearance with small hemoglobin, large leukocytes, positive nitrites, many bacteria, 21-50 RBCs per high-power field, greater than 50 WBCs; unfortunately there is no urinalysis obtained on admission so obtain 1 now but she received antibiotics already -Continue IV ceftriaxone -Urine culture has grown out ESBL E. Coli. She has been transitioned to Meropenem. She will need 5 days for her complicated UTI. -Continue monitor and trend CBC and fever curve; Current WBC:     Recent Labs  Lab 05/29/23 2237 05/30/23 0451  WBC 8.4 7.3  Iron Deficiency Anemia:' Will start iron supplementation when Infectious source is under control. Paroxysmal A-fib -EKG on  admission shows sinus rhythm with LBBB. HR stable in the 70s and 80s. -Continue Anticoagulation with Apixaban 5 mg po BID  and Metoprolol Succinate 25 mg po Daily -C/w Telemetry -Check Mag and Phos   Hypomagnesemia -Magnesium is 1.7 today. -Replete with IV Mag Sulfate 4 Grams -Continue to Monitor and Replete as Necessary -Follow magnesium   HTN SBP in 140's and DBP in the 40's to 60  -Continue amlodipine and Toprol XL   Diabetes Mellitus Type 2 On metformin at home. Last A1c 5.9% 5 months ago. -SSI with meals   Normocytic Anemia with iron deficiency anemia -Hgb/Hct Trend: -iron deficiency is present. Iron will be supplemented when infection is under control. -Continue to Monitor for S/Sx of Bleeding; No overt bleeding -Repeat CBC in the AM   Thrombocytosis -Platelet Count Trend:     Recent Labs  Lab 05/29/23 2237 05/30/23 0451  PLT 153 125*  -Continue to Monitor and Trend and Repeat CBC in the AM   Hypoalbuminemia -Patient's Albumin Trend:    Recent Labs  Lab 05/29/23 2237  ALBUMIN 2.8*  -Continue to Monitor and Trend and repeat CMP in the AM   Overweight -Complicates overall prognosis and care -Estimated body mass index is 28.98 kg/m as calculated from the following:   Height as of 12/02/22: 5\' 5"  (1.651 m).   Weight as of this encounter: 79 kg.  -Weight Loss and Dietary Counseling given   DVT prophylaxis:  apixaban (ELIQUIS) tablet 5 mg    Code Status: Limited: Do not attempt resuscitation (DNR) -DNR-LIMITED -Do Not Intubate/DNI  Family Communication: Discussed with the patient's daughter [name] over the telephone   Disposition Plan:  Level of care: Progressive Status is: Inpatient Remains inpatient appropriate because: Needs further clinical improvement and susceptibilities to safely discharged on p.o.   Consultants:  Neurology   Procedures:  As delineated as above   Antimicrobials:  Anti-infectives(From admission, onward)       Antimicrobials:  Anti-infectives (From admission, onward)    Start     Dose/Rate Route Frequency Ordered Stop   06/03/23 2200  meropenem (MERREM) 1 g in sodium chloride 0.9 % 100 mL IVPB        1 g 200 mL/hr over 30 Minutes Intravenous Every 8 hours 06/03/23 1515 06/06/23 0759   06/01/23 1015  meropenem (MERREM) 1 g in sodium chloride 0.9 % 100 mL IVPB  Status:  Discontinued        1 g 200 mL/hr over 30 Minutes Intravenous Every 12 hours 06/01/23 0917 06/03/23 1515   05/29/23 2345  cefTRIAXone (ROCEPHIN) 1 g in sodium chloride 0.9 % 100 mL IVPB  Status:  Discontinued        1 g 200 mL/hr over 30 Minutes Intravenous Every 24 hours 05/29/23 2336 06/01/23 0917       Subjective: The patient is resting comfortably. No new complaints.   Objective: Vitals:   06/04/23 2344 06/05/23 0320 06/05/23 0811 06/05/23 1156  BP: (!) 127/56 (!) 141/50 137/64 (!) 144/55  Pulse:  74 76 67  Resp: 18  18 16   Temp: 98.5 F (36.9 C) 98.6 F (37 C) 98.4 F (36.9 C) 97.8 F (36.6 C)  TempSrc: Oral Axillary Axillary Oral  SpO2: 91% 94% 93% 93%  Weight:      Height:        Intake/Output Summary (Last 24 hours) at 06/05/2023 1448 Last  data filed at 06/05/2023 1230 Gross per 24 hour  Intake 743 ml  Output 1200 ml  Net -457 ml   Filed Weights   05/29/23 2200  Weight: 79 kg   Examination: Physical Exam:  Exam:  Constitutional:  The patient is awake, alert, and oriented x 3. No acute distress. Respiratory:  No increased work of breathing. No wheezes, rales, or rhonchi No tactile fremitus Cardiovascular:  Regular rate and rhythm No murmurs, ectopy, or gallups. No lateral PMI. No thrills. Abdomen:  Abdomen is soft, non-tender, non-distended No hernias, masses, or organomegaly Normoactive bowel sounds.  Musculoskeletal:  No cyanosis, clubbing, or edema Skin:  No rashes, lesions, ulcers palpation of skin: no induration or nodules Neurologic:  CN 2-12 intact Sensation all 4  extremities intact Psychiatric:  Mental status Mood, affect appropriate Orientation to person, place, time  judgment and insight appear intact   Data Reviewed: I have personally reviewed following labs and imaging studies  CBC: Recent Labs  Lab 06/01/23 0558 06/02/23 0612 06/03/23 0759 06/04/23 0609 06/05/23 0643  WBC 6.0 6.8 6.9 6.3 6.4  NEUTROABS 3.7 4.8 4.6 4.1 4.2  HGB 10.6* 10.7* 11.6* 10.2* 9.5*  HCT 35.0* 34.7* 38.4 33.8* 31.8*  MCV 83.1 83.4 84.0 84.1 84.4  PLT 151 153 187 138* 153   Basic Metabolic Panel: Recent Labs  Lab 05/30/23 0451 05/31/23 0538 06/01/23 0558 06/02/23 0612 06/03/23 0759 06/04/23 0609 06/05/23 0643  NA 140 138 140 139 138 139 142  K 4.1 3.3* 4.4 4.1 4.1 3.9 3.8  CL 96* 100 102 97* 100 105 103  CO2 26 29 30 30 30 28 29   GLUCOSE 120* 110* 109* 117* 128* 120* 162*  BUN 10 11 8  7* 9 11 16   CREATININE 0.58 0.53 0.69 0.61 0.59 0.58 0.74  CALCIUM 8.8* 8.6* 9.0 9.3 9.1 8.9 9.0  MG 1.2* 2.0 1.7  --   --   --   --   PHOS 2.5 3.3 3.0  --   --   --   --    GFR: Estimated Creatinine Clearance: 58.3 mL/min (by C-G formula based on SCr of 0.74 mg/dL). Liver Function Tests: Recent Labs  Lab 05/29/23 2237 05/31/23 0538 06/01/23 0558  AST 23 18 28   ALT 13 11 14   ALKPHOS 100 92 89  BILITOT 0.8 0.8 0.7  PROT 7.3 6.4* 7.0  ALBUMIN 2.8* 2.5* 2.6*   No results for input(s): "LIPASE", "AMYLASE" in the last 168 hours. Recent Labs  Lab 05/29/23 2345  AMMONIA 17   Coagulation Profile: Recent Labs  Lab 05/29/23 2237  INR 1.2   Cardiac Enzymes: No results for input(s): "CKTOTAL", "CKMB", "CKMBINDEX", "TROPONINI" in the last 168 hours. BNP (last 3 results) No results for input(s): "PROBNP" in the last 8760 hours. HbA1C: No results for input(s): "HGBA1C" in the last 72 hours. CBG: Recent Labs  Lab 06/04/23 1647 06/04/23 2054 06/05/23 0605 06/05/23 0733 06/05/23 1202  GLUCAP 134* 138* 170* 146* 147*   Lipid Profile: No results for  input(s): "CHOL", "HDL", "LDLCALC", "TRIG", "CHOLHDL", "LDLDIRECT" in the last 72 hours. Thyroid Function Tests: No results for input(s): "TSH", "T4TOTAL", "FREET4", "T3FREE", "THYROIDAB" in the last 72 hours. Anemia Panel: No results for input(s): "VITAMINB12", "FOLATE", "FERRITIN", "TIBC", "IRON", "RETICCTPCT" in the last 72 hours.  Sepsis Labs: No results for input(s): "PROCALCITON", "LATICACIDVEN" in the last 168 hours.  Recent Results (from the past 240 hours)  Urine Culture     Status: Abnormal   Collection Time: 05/29/23  11:08 PM   Specimen: In/Out Cath Urine  Result Value Ref Range Status   Specimen Description IN/OUT CATH URINE  Final   Special Requests   Final    NONE Performed at [facility], [address], [place], [place] [address]    Culture (A)  Final    >=100,000 COLONIES/mL ESCHERICHIA COLI Confirmed Extended Spectrum Beta-Lactamase Producer (ESBL).  In bloodstream infections from ESBL organisms, carbapenems are preferred over piperacillin/tazobactam. They are shown to have a lower risk of mortality.    Report Status 06/01/2023 FINAL  Final   Organism ID, Bacteria ESCHERICHIA COLI (A)  Final      Susceptibility   Escherichia coli - MIC*    AMPICILLIN >=32 RESISTANT Resistant     CEFAZOLIN >=64 RESISTANT Resistant     CEFEPIME 16 RESISTANT Resistant     CEFTRIAXONE >=64 RESISTANT Resistant     CIPROFLOXACIN >=4 RESISTANT Resistant     GENTAMICIN >=16 RESISTANT Resistant     IMIPENEM <=0.25 SENSITIVE Sensitive     NITROFURANTOIN 64 INTERMEDIATE Intermediate     TRIMETH/SULFA <=20 SENSITIVE Sensitive     AMPICILLIN/SULBACTAM >=32 RESISTANT Resistant     PIP/TAZO 16 SENSITIVE Sensitive ug/mL    * >=100,000 COLONIES/mL ESCHERICHIA COLI  Urine Culture (for pregnant, neutropenic or urologic patients or patients with an indwelling urinary catheter)     Status: Abnormal   Collection Time: 05/30/23  9:50 AM   Specimen: In/Out Cath Urine  Result Value Ref Range  Status   Specimen Description IN/OUT CATH URINE  Final   Special Requests   Final    NONE Performed at [facility], [address], [place], [place] [address]    Culture (A)  Final    40,000 COLONIES/mL ESCHERICHIA COLI Confirmed Extended Spectrum Beta-Lactamase Producer (ESBL).  In bloodstream infections from ESBL organisms, carbapenems are preferred over piperacillin/tazobactam. They are shown to have a lower risk of mortality.    Report Status 06/01/2023 FINAL  Final   Organism ID, Bacteria ESCHERICHIA COLI (A)  Final      Susceptibility   Escherichia coli - MIC*    AMPICILLIN >=32 RESISTANT Resistant     CEFAZOLIN >=64 RESISTANT Resistant     CEFEPIME 16 RESISTANT Resistant     CEFTRIAXONE >=64 RESISTANT Resistant     CIPROFLOXACIN >=4 RESISTANT Resistant     GENTAMICIN >=16 RESISTANT Resistant     IMIPENEM <=0.25 SENSITIVE Sensitive     NITROFURANTOIN 64 INTERMEDIATE Intermediate     TRIMETH/SULFA <=20 SENSITIVE Sensitive     AMPICILLIN/SULBACTAM >=32 RESISTANT Resistant     PIP/TAZO 8 SENSITIVE Sensitive ug/mL    * 40,000 COLONIES/mL ESCHERICHIA COLI  MRSA Next Gen by PCR, Nasal     Status: Abnormal   Collection Time: 06/02/23 11:02 AM   Specimen: Nasal Mucosa; Nasal Swab  Result Value Ref Range Status   MRSA by PCR Next Gen DETECTED (A) NOT DETECTED Final    Comment: RESULT CALLED TO, READ BACK BY AND VERIFIED WITH: RN Tonita Phoenix [phone] @ [phone] (NOTE) The GeneXpert MRSA Assay (FDA approved for NASAL specimens only), is one component of a comprehensive MRSA colonization surveillance program. It is not intended to diagnose MRSA infection nor to guide or monitor treatment for MRSA infections. Test performance is not FDA approved in patients less than 41 years old. Performed at [facility], [address] [place], [place] [address]     Radiology Studies: No results found.  Scheduled Meds:  amLODipine  10 mg Oral Daily   apixaban  5 mg Oral BID    Chlorhexidine Gluconate Cloth  6 each Topical Daily   feeding supplement  237 mL Oral BID BM   FLUoxetine  20 mg Oral Daily   insulin aspart  0-15 Units Subcutaneous TID WC   metoprolol succinate  25 mg Oral Daily   mupirocin ointment  1 Application Nasal BID   Continuous Infusions:  meropenem (MERREM) IV Stopped (06/05/23 0958)    LOS: 6 days   Rolla Servidio, DO [facility] Available via Epic secure chat 7am-7pm After these hours, please refer to coverage provider listed on [email or web address] 06/05/2023, 2:48 PM

## 2023-06-05 NOTE — Plan of Care (Signed)
 Calm, comfortable, refused prn meds for constipation. No other complaints noted.  Problem: Education: Goal: Ability to describe self-care measures that may prevent or decrease complications (Diabetes Survival Skills Education) will improve 06/05/2023 0144 by Laurence Aly, RN Outcome: Progressing  Problem: Skin Integrity: Goal: Risk for impaired skin integrity will decrease 06/05/2023 0144 by Laurence Aly, RN Outcome: Progressing 06/05/2023 0136 by Laurence Aly, RN Outcome: Progressing   Problem: Safety: Goal: Ability to remain free from injury will improve 06/05/2023 0144 by Laurence Aly, RN Outcome: Progressing 06/05/2023 0136 by Laurence Aly, RN Outcome: Progressing    Problem: Health Behavior/Discharge Planning: Goal: Ability to manage health-related needs will improve 06/05/2023 0144 by Laurence Aly, RN Outcome: Progressing

## 2023-06-05 NOTE — Plan of Care (Signed)
  Problem: Fluid Volume: Goal: Ability to maintain a balanced intake and output will improve Outcome: Progressing   Problem: Health Behavior/Discharge Planning: Goal: Ability to manage health-related needs will improve Outcome: Progressing   Problem: Metabolic: Goal: Ability to maintain appropriate glucose levels will improve Outcome: Progressing   Problem: Nutritional: Goal: Maintenance of adequate nutrition will improve Outcome: Progressing   Problem: Skin Integrity: Goal: Risk for impaired skin integrity will decrease Outcome: Progressing

## 2023-06-06 DIAGNOSIS — N3000 Acute cystitis without hematuria: Secondary | ICD-10-CM

## 2023-06-06 LAB — BASIC METABOLIC PANEL
Anion gap: 9 (ref 5–15)
BUN: 11 mg/dL (ref 8–23)
CO2: 31 mmol/L (ref 22–32)
Calcium: 9.3 mg/dL (ref 8.9–10.3)
Chloride: 98 mmol/L (ref 98–111)
Creatinine, Ser: 0.55 mg/dL (ref 0.44–1.00)
GFR, Estimated: >60 mL/min (ref >60)
Glucose, Bld: 131 mg/dL — ABNORMAL HIGH (ref 70–99)
Potassium: 4 mmol/L (ref 3.5–5.1)
Sodium: 138 mmol/L (ref 135–145)

## 2023-06-06 LAB — CBC WITH DIFFERENTIAL/PLATELET
Abs Immature Granulocytes: 0.02 10*3/uL (ref 0.00–0.07)
Basophils Absolute: 0 10*3/uL (ref 0.0–0.1)
Basophils Relative: 0 %
Eosinophils Absolute: 0.1 10*3/uL (ref 0.0–0.5)
Eosinophils Relative: 2 %
HCT: 35.2 % — ABNORMAL LOW (ref 36.0–46.0)
Hemoglobin: 10.6 g/dL — ABNORMAL LOW (ref 12.0–15.0)
Immature Granulocytes: 0 %
Lymphocytes Relative: 26 %
Lymphs Abs: 1.7 10*3/uL (ref 0.7–4.0)
MCH: 25.2 pg — ABNORMAL LOW (ref 26.0–34.0)
MCHC: 30.1 g/dL (ref 30.0–36.0)
MCV: 83.8 fL (ref 80.0–100.0)
Monocytes Absolute: 0.5 10*3/uL (ref 0.1–1.0)
Monocytes Relative: 7 %
Neutro Abs: 4.4 10*3/uL (ref 1.7–7.7)
Neutrophils Relative %: 65 %
Platelets: 149 10*3/uL — ABNORMAL LOW (ref 150–400)
RBC: 4.2 MIL/uL (ref 3.87–5.11)
RDW: 16.1 % — ABNORMAL HIGH (ref 11.5–15.5)
WBC: 6.7 10*3/uL (ref 4.0–10.5)
nRBC: 0 % (ref 0.0–0.2)

## 2023-06-06 LAB — GLUCOSE, CAPILLARY
Glucose-Capillary: 88 mg/dL (ref 70–99)
Glucose-Capillary: 178 mg/dL — ABNORMAL HIGH (ref 70–99)
Glucose-Capillary: 142 mg/dL — ABNORMAL HIGH (ref 70–99)
Glucose-Capillary: 147 mg/dL — ABNORMAL HIGH (ref 70–99)

## 2023-06-06 MED ORDER — POLYETHYLENE GLYCOL 3350 17 G PO PACK
17.0000 g | PACK | Freq: Every day | ORAL | Status: DC
  Administered 2023-06-06: 17 g via ORAL
  Filled 2023-06-06: qty 1

## 2023-06-06 MED ORDER — SENNOSIDES-DOCUSATE SODIUM 8.6-50 MG PO TABS: 2.0000 | ORAL_TABLET | Freq: Two times a day (BID) | ORAL | Status: DC

## 2023-06-06 MED ORDER — SENNOSIDES-DOCUSATE SODIUM 8.6-50 MG PO TABS
2.0000 | ORAL_TABLET | Freq: Two times a day (BID) | ORAL | Status: DC
  Administered 2023-06-06: 2 via ORAL
  Filled 2023-06-06: qty 2

## 2023-06-06 MED ORDER — BISACODYL 10 MG RE SUPP
10.0000 mg | Freq: Once | RECTAL | Status: AC
  Administered 2023-06-06: 10 mg via RECTAL
  Filled 2023-06-06: qty 1

## 2023-06-06 MED ORDER — FOLIC ACID 1 MG PO TABS
1.0000 mg | ORAL_TABLET | Freq: Every day | ORAL | Status: DC
Start: 2023-06-06 — End: 2023-09-01

## 2023-06-06 MED ORDER — POLYETHYLENE GLYCOL 3350 17 G PO PACK: 17.0000 g | PACK | Freq: Every day | ORAL | Status: DC

## 2023-06-06 NOTE — TOC Transition Note (Signed)
 Transition of Care [facility]) - Discharge Note   Patient Details  Name: Jeanette [name] MRN: [id number] Date of Birth: 06-04-1940  [facility] CM/SW Contact:  Baldemar Lenis, LCSW Phone Number: 06/06/2023, 3:04 PM   Clinical Narrative:   CSW updated by MD that patient stable to return to SNF, needs bowel movement first. CSW confirmed with RN patient had bowel movement. CSW sent discharge summary to [facility], confirmed receipt and ready for patient to return. CSW attempted to reach daughter, [name], left a voicemail to update about discharge. Transport arranged with [facility] for next available.  Nurse to call report to [phone].    Final next level of care: Skilled Nursing Facility Barriers to Discharge: Barriers Resolved   Patient Goals and CMS Choice Patient states their goals for this hospitalization and ongoing recovery are:: patient unable to participate in goal setting, not fully oriented CMS Medicare.gov Compare Post Acute Care list provided to:: Patient Represenative (must comment) Choice offered to / list presented to : Adult Children [place] ownership interest in [facility].provided to:: Adult Children    Discharge Placement              Patient chooses bed at: [facility], [place] Patient to be transferred to facility by: [facility] Name of family member notified: [name], left a voicemail Patient and family notified of of transfer: 06/06/23  Discharge Plan and Services Additional resources added to the After Visit Summary for       Post Acute Care Choice: Nursing Home                               Social Drivers of Health (SDOH) Interventions SDOH Screenings   Food Insecurity: No Food Insecurity (06/02/2023)  Housing: Low Risk  (06/02/2023)  Transportation Needs: No Transportation Needs (06/02/2023)  Utilities: Not At Risk (05/31/2023)  Depression (PHQ2-9): Low Risk  (06/23/2018)  Financial Resource Strain: Low Risk  (01/13/2022)    Received from [facility], [facility]  Social Connections: Unknown (05/31/2023)  Stress: No Stress Concern Present (01/13/2022)   Received from [facility], [facility]  Tobacco Use: Low Risk  (05/29/2023)     Readmission Risk Interventions     No data to display

## 2023-06-06 NOTE — Progress Notes (Signed)
 Discharged ro SNF per [facility].

## 2023-06-06 NOTE — Discharge Summary (Signed)
 [facility]  Physician Discharge Summary   Patient ID: Jeanette [name] MRN: [id number] DOB/AGE: November 17, 1940 83 y.o.  Admit date: 05/29/2023 Discharge date:   06/06/2023   PCP: Crist Fat, MD  DISCHARGE DIAGNOSES:    Acute encephalopathy   Acute cystitis with hematuria   RECOMMENDATIONS FOR OUTPATIENT FOLLOW UP: Please check CBC and basic metabolic panel in 1 week Consider iron supplementations once constipation has improved.   Home Health: SNF Equipment/Devices: None  CODE STATUS: DNR  DISCHARGE CONDITION: fair  Diet recommendation: Regular as tolerated  INITIAL HISTORY: 83 year old Caucasian female with a past medical history significant for but not limited to atrial fibrillation on anticoagulation with Eliquis, hypertension, hyperlipidemia, chronic back pain, diabetes mellitus type 2 with diabetic neuropathy, asthma, degenerative disc disease history of UTIs and vertebral osteomyelitis and also history of impaired ambulation who is mostly wheelchair-bound presented to the hospital via EMS from SNF as a code stroke.  MRI and CT head were unremarkable for acute stroke.  Seen by neurology.  Underwent EEG.  Symptoms thought to be due to UTI rather than a neurological process.  She was hospitalized for further management.    HOSPITAL COURSE:   Acute Metabolic Encephalopathy -Patient presented from SNF as a code stroke due to unresponsiveness, left-sided weakness and right gaze preference.   -Head and neck imaging overall does not show any acute intracranial abnormalities. -Normal ethanol, ammonia and UDS.  -UA shows signs of infection which is the most likely cause of her encephalopathy.  EEG did not show any seizure or epileptiform activity. Mentation has improved with treatment of UTI.  Acute UTI Cystitis/ESBL E. coli Initially treated with ceftriaxone.  Changed over to meropenem for 5 days.  She has completed course of antibiotics.    Iron Deficiency  Anemia: Anemia panel reviewed.  Folic acid was noted to be deficient at 5.4.  Ferritin 23, iron 22, TIBC 232, percent saturation 10.  B12 level 972. Will be discharged on folic acid.  Iron to be considered once her constipation has improved.  Paroxysmal A-fib Continue with metoprolol and apixaban.  Hypomagnesemia Supplemented   Essential hypertension   Diabetes Mellitus Type 2   Thrombocytosis Stable   Patient is stable for discharge to SNF once she has had a bowel movement.   PERTINENT LABS:  The results of significant diagnostics from this hospitalization (including imaging, microbiology, ancillary and laboratory) are listed below for reference.    Microbiology: Recent Results (from the past 240 hours)  Urine Culture     Status: Abnormal   Collection Time: 05/29/23 11:08 PM   Specimen: In/Out Cath Urine  Result Value Ref Range Status   Specimen Description IN/OUT CATH URINE  Final   Special Requests   Final    NONE Performed at [facility], [address], [place], [place] [address]    Culture (A)  Final    >=100,000 COLONIES/mL ESCHERICHIA COLI Confirmed Extended Spectrum Beta-Lactamase Producer (ESBL).  In bloodstream infections from ESBL organisms, carbapenems are preferred over piperacillin/tazobactam. They are shown to have a lower risk of mortality.    Report Status 06/01/2023 FINAL  Final   Organism ID, Bacteria ESCHERICHIA COLI (A)  Final      Susceptibility   Escherichia coli - MIC*    AMPICILLIN >=32 RESISTANT Resistant     CEFAZOLIN >=64 RESISTANT Resistant     CEFEPIME 16 RESISTANT Resistant     CEFTRIAXONE >=64 RESISTANT Resistant     CIPROFLOXACIN >=4 RESISTANT Resistant  GENTAMICIN >=16 RESISTANT Resistant     IMIPENEM <=0.25 SENSITIVE Sensitive     NITROFURANTOIN 64 INTERMEDIATE Intermediate     TRIMETH/SULFA <=20 SENSITIVE Sensitive     AMPICILLIN/SULBACTAM >=32 RESISTANT Resistant     PIP/TAZO 16 SENSITIVE Sensitive ug/mL    *  >=100,000 COLONIES/mL ESCHERICHIA COLI  Urine Culture (for pregnant, neutropenic or urologic patients or patients with an indwelling urinary catheter)     Status: Abnormal   Collection Time: 05/30/23  9:50 AM   Specimen: In/Out Cath Urine  Result Value Ref Range Status   Specimen Description IN/OUT CATH URINE  Final   Special Requests   Final    NONE Performed at [facility], [address], [place], [place] [address]    Culture (A)  Final    40,000 COLONIES/mL ESCHERICHIA COLI Confirmed Extended Spectrum Beta-Lactamase Producer (ESBL).  In bloodstream infections from ESBL organisms, carbapenems are preferred over piperacillin/tazobactam. They are shown to have a lower risk of mortality.    Report Status 06/01/2023 FINAL  Final   Organism ID, Bacteria ESCHERICHIA COLI (A)  Final      Susceptibility   Escherichia coli - MIC*    AMPICILLIN >=32 RESISTANT Resistant     CEFAZOLIN >=64 RESISTANT Resistant     CEFEPIME 16 RESISTANT Resistant     CEFTRIAXONE >=64 RESISTANT Resistant     CIPROFLOXACIN >=4 RESISTANT Resistant     GENTAMICIN >=16 RESISTANT Resistant     IMIPENEM <=0.25 SENSITIVE Sensitive     NITROFURANTOIN 64 INTERMEDIATE Intermediate     TRIMETH/SULFA <=20 SENSITIVE Sensitive     AMPICILLIN/SULBACTAM >=32 RESISTANT Resistant     PIP/TAZO 8 SENSITIVE Sensitive ug/mL    * 40,000 COLONIES/mL ESCHERICHIA COLI  MRSA Next Gen by PCR, Nasal     Status: Abnormal   Collection Time: 06/02/23 11:02 AM   Specimen: Nasal Mucosa; Nasal Swab  Result Value Ref Range Status   MRSA by PCR Next Gen DETECTED (A) NOT DETECTED Final    Comment: RESULT CALLED TO, READ BACK BY AND VERIFIED WITH: RN Tonita Phoenix [phone] @ [phone] (NOTE) The GeneXpert MRSA Assay (FDA approved for NASAL specimens only), is one component of a comprehensive MRSA colonization surveillance program. It is not intended to diagnose MRSA infection nor to guide or monitor treatment for MRSA infections. Test  performance is not FDA approved in patients less than 50 years old. Performed at [facility], [address] [place], [place] [address]      Labs:   Basic Metabolic Panel: Recent Labs  Lab 05/31/23 [phone] 06/01/23 0558 06/02/23 0612 06/03/23 0759 06/04/23 0609 06/05/23 0643 06/06/23 0628  NA 138 140 139 138 139 142 138  K 3.3* 4.4 4.1 4.1 3.9 3.8 4.0  CL 100 102 97* 100 105 103 98  CO2 29 30 30 30 28 29 31   GLUCOSE 110* 109* 117* 128* 120* 162* 131*  BUN 11 8 7* 9 11 16 11   CREATININE 0.53 0.69 0.61 0.59 0.58 0.74 0.55  CALCIUM 8.6* 9.0 9.3 9.1 8.9 9.0 9.3  MG 2.0 1.7  --   --   --   --   --   PHOS 3.3 3.0  --   --   --   --   --    Liver Function Tests: Recent Labs  Lab 05/31/23 0538 06/01/23 0558  AST 18 28  ALT 11 14  ALKPHOS 92 89  BILITOT 0.8 0.7  PROT 6.4* 7.0  ALBUMIN 2.5* 2.6*    CBC: Recent Labs  Lab 06/02/23 0612 06/03/23 0759 06/04/23 0609 06/05/23 0643 06/06/23 0628  WBC 6.8 6.9 6.3 6.4 6.7  NEUTROABS 4.8 4.6 4.1 4.2 4.4  HGB 10.7* 11.6* 10.2* 9.5* 10.6*  HCT 34.7* 38.4 33.8* 31.8* 35.2*  MCV 83.4 84.0 84.1 84.4 83.8  PLT 153 187 138* 153 149*    CBG: Recent Labs  Lab 06/05/23 0733 06/05/23 1202 06/05/23 1608 06/06/23 0600 06/06/23 0652  GLUCAP 146* 147* 236* 88 178*     IMAGING STUDIES EEG adult Result Date: 05/30/2023 Charlsie Quest, MD     05/30/2023 11:03 AM Patient Name: JANI MORONTA MRN: [id number] Epilepsy Attending: Charlsie Quest Referring Physician/Provider: Steffanie Rainwater, MD Date: 05/30/2023 Duration: 23.11 mins Patient history: 83yo F with speech difficulty and left-sided weakness. EEG to evaluate for seizure Level of alertness: Awake AEDs during EEG study: None Technical aspects: This EEG study was done with scalp electrodes positioned according to the 10-20 International system of electrode placement. Electrical activity was reviewed with band pass filter of 1-70Hz , sensitivity of 7 uV/mm, display  speed of 31mm/sec with a 60Hz  notched filter applied as appropriate. EEG data were recorded continuously and digitally stored.  Video monitoring was available and reviewed as appropriate. Description: EEG showed continuous generalized and lateralized right hemisphere predominantly 5 to 6 Hz theta slowing admixed with intermittent 2-3Hz  delta slowing. Hyperventilation and photic stimulation were not performed.   ABNORMALITY - Continuous slow, generalized and lateralized right hemisphere IMPRESSION: This study is suggestive of cortical dysfunction arising from right hemisphere likely secondary to underlying structural abnormality. Additionally there is moderate diffuse encephalopathy. No seizures or epileptiform discharges were seen throughout the recording. Charlsie Quest   MR BRAIN WO CONTRAST Result Date: 05/30/2023 CLINICAL DATA:  Initial evaluation for acute neuro deficit, stroke suspected. EXAM: MRI HEAD WITHOUT CONTRAST TECHNIQUE: Multiplanar, multiecho pulse sequences of the brain and surrounding structures were obtained without intravenous contrast. COMPARISON:  Prior CT from 05/29/2023 as well as previous MRI from 09/14/2016. FINDINGS: Brain: Mild age-related cerebral atrophy. Patchy T2/FLAIR hyperintensity involving the periventricular deep white matter as well as the pons, consistent with chronic microvascular ischemic disease, moderately advanced in nature. Remote lacunar infarct present at the right thalamus. Small remote right cerebellar infarct noted. No evidence for acute or subacute infarct. Gray-white matter differentiation maintained. No acute intracranial hemorrhage. Few scattered punctate chronic micro hemorrhages noted. Additionally, note is made of small foci of susceptibility artifact within the occipital horns of both lateral ventricles (series 12, image 26). No signal changes seen at this location on corresponding sequences to suggest acute hemorrhage. No visible intraventricular  hemorrhage seen on prior CT. Given this, finding is felt to be chronic in nature, although this appears to be new as compared to prior MRI from 2018. No mass lesion, midline shift or mass effect. No hydrocephalus or extra-axial fluid collection. Pituitary gland suprasellar region within normal limits. Vascular: Major intracranial vascular flow voids are maintained. Skull and upper cervical spine: Craniocervical junction with normal limits. Bone marrow signal intensity normal. No scalp soft tissue abnormality. Sinuses/Orbits: Globes orbital soft tissues within normal limits. Paranasal sinuses are largely clear. No mastoid effusion. Other: None. IMPRESSION: 1. No acute intracranial abnormality. 2. Age-related cerebral atrophy with moderate chronic microvascular ischemic disease, with a few small remote infarcts involving the right thalamus and right cerebellum. 3. Small foci of susceptibility artifact within the occipital horns of both lateral ventricles, felt to be chronic  in nature, although this appears to be new as compared to prior MRI from 2018. Electronically Signed   By: Rise Mu M.D.   On: 05/30/2023 01:39   CT ANGIO HEAD NECK W WO CM (CODE STROKE) Result Date: 05/29/2023 CLINICAL DATA:  Initial evaluation for acute neuro deficit, stroke suspected, left-sided weakness, right gaze, aphasia. EXAM: CT ANGIOGRAPHY HEAD AND NECK WITH AND WITHOUT CONTRAST TECHNIQUE: Multidetector CT imaging of the head and neck was performed using the standard protocol during bolus administration of intravenous contrast. Multiplanar CT image reconstructions and MIPs were obtained to evaluate the vascular anatomy. Carotid stenosis measurements (when applicable) are obtained utilizing NASCET criteria, using the distal internal carotid diameter as the denominator. RADIATION DOSE REDUCTION: This exam was performed according to the departmental dose-optimization program which includes automated exposure control, adjustment  of the mA and/or kV according to patient size and/or use of iterative reconstruction technique. CONTRAST:  75mL OMNIPAQUE IOHEXOL 350 MG/ML SOLN COMPARISON:  CT from earlier the same day. FINDINGS: CTA NECK FINDINGS Aortic arch: Visualized aortic arch within normal limits for caliber with standard branch pattern. Moderately advanced atheromatous change about the arch itself. Associated short-segment 65% stenosis at the proximal left subclavian artery (series 6, image 58). Right carotid system: Right common and internal carotid arteries are tortuous but patent without dissection. Calcified plaque about the right carotid bulb without hemodynamically significant greater than 50% stenosis. Left carotid system: Left common and internal carotid arteries are tortuous but patent without dissection. Calcified plaque about the left carotid bulb without hemodynamically significant greater than 50% stenosis. Irregularity about the mid-distal cervical left ICA, suggestive of FMD. Vertebral arteries: Left vertebral artery arises directly from the aortic arch. Atheromatous change at the origins of both vertebral arteries with up to moderate stenosis on the left (series 6, image 55). Vertebral arteries otherwise patent distally without significant stenosis or dissection. Skeleton: No discrete or worrisome osseous lesions. Mild chronic height loss noted at the superior endplates of T1 and T2. Advanced osteoarthritic changes noted about the C1-2 articulations. Moderate to advanced spondylosis at C5-6 and C6-7. Other neck: No other acute finding.  1.8 cm left thyroid nodule. Upper chest: Visualized upper chest demonstrates no acute finding. Review of the MIP images confirms the above findings CTA HEAD FINDINGS Anterior circulation: Atheromatous change about the carotid siphons without hemodynamically significant stenosis. A1 segments patent bilaterally. Normal anterior communicating artery complex. Anterior cerebral arteries patent  without stenosis. No M1 stenosis or occlusion. Distal MCA branches perfused and symmetric. Posterior circulation: Both V4 segments patent without significant stenosis. Both PICA patent at their origins. Basilar patent without stenosis. Superior cerebral arteries patent bilaterally. Left PCA supplied via the basilar. Fetal type origin of the right PCA. Atheromatous irregularity about the left PCA with associated mild to moderate left P2 stenoses (series 10, image 19). PCAs otherwise widely patent to their distal aspects. Venous sinuses: Patent allowing for timing the contrast bolus. Anatomic variants: As above.  No aneurysm. Review of the MIP images confirms the above findings IMPRESSION: 1. Negative CTA for large vessel occlusion or other emergent finding. 2. Atheromatous change about the carotid bifurcations and carotid siphons without hemodynamically significant greater than 50% stenosis. Mild-to-moderate left P2 stenoses as above. 3. Irregularity about the mid-distal cervical left ICA, suggestive of FMD. 4. Atheromatous change at the origins of both vertebral arteries with up to moderate stenosis on the left. Left vertebral artery arises directly from the aortic arch. 5. Short-segment 65% stenosis at the proximal left  subclavian artery. 6.  Aortic Atherosclerosis (ICD10-I70.0). 7. 1.8 cm left thyroid nodule. Further evaluation with dedicated thyroid ultrasound recommended (ref: J Am Coll Radiol. 2015 Feb;12(2): 143-50). These results were communicated to Dr. Wilford Corner at 10:55 pm on 05/29/2023 by text page via the [facility] messaging system. Electronically Signed   By: Rise Mu M.D.   On: 05/29/2023 23:12   CT HEAD CODE STROKE WO CONTRAST Result Date: 05/29/2023 CLINICAL DATA:  Code stroke. Initial evaluation for acute neuro deficit, stroke suspected. EXAM: CT HEAD WITHOUT CONTRAST TECHNIQUE: Contiguous axial images were obtained from the base of the skull through the vertex without intravenous contrast.  RADIATION DOSE REDUCTION: This exam was performed according to the departmental dose-optimization program which includes automated exposure control, adjustment of the mA and/or kV according to patient size and/or use of iterative reconstruction technique. COMPARISON:  Prior study from 09/14/2016 FINDINGS: Brain: Age-related cerebral atrophy with moderate chronic microischemic disease. No acute intracranial hemorrhage. No acute large vessel territory infarct. No mass lesion or midline shift. No hydrocephalus or extra-axial fluid collection. Vascular: No abnormal hyperdense vessel. Calcified atherosclerosis present at the skull base. Skull: Scalp soft tissues within normal limits for calvarium intact. Sinuses/Orbits: Globes and orbital soft tissues demonstrate no acute finding. Paranasal sinuses and mastoid air cells are clear. Other: None. ASPECTS [facility] Stroke Program Early CT Score) - Ganglionic level infarction (caudate, lentiform nuclei, internal capsule, insula, M1-M3 cortex): 7 - Supraganglionic infarction (M4-M6 cortex): 3 Total score (0-10 with 10 being normal): 10 IMPRESSION: 1. No acute intracranial abnormality. 2. ASPECTS is 10. 3. Age-related cerebral atrophy with moderate chronic microvascular ischemic disease. These results were communicated to Dr. Wilford Corner at 10:48 pm on 05/29/2023 by text page via the [facility] messaging system. Electronically Signed   By: Rise Mu M.D.   On: 05/29/2023 22:49    DISCHARGE EXAMINATION: Vitals:   06/05/23 2000 06/05/23 2352 06/06/23 0400 06/06/23 0735  BP: (!) 140/59 129/60 (!) 137/56 (!) 143/76  Pulse: 73 84 79 72  Resp: 15 15 16 16   Temp: 97.9 F (36.6 C) 99.2 F (37.3 C) 97.6 F (36.4 C) 97.8 F (36.6 C)  TempSrc: Oral Oral Oral Oral  SpO2: 93% 95% 95% 95%  Weight:      Height:       General appearance: Awake alert.  In no distress Resp: Clear to auscultation bilaterally.  Normal effort Cardio: S1-S2 is normal regular.  No S3-S4.  No rubs  murmurs or bruit GI: Abdomen is soft.  Nontender nondistended.  Bowel sounds are present normal.  No masses organomegaly   DISPOSITION: SNF  Discharge Instructions     Call MD for:  difficulty breathing, headache or visual disturbances   Complete by: As directed    Call MD for:  persistant dizziness or light-headedness   Complete by: As directed    Call MD for:  persistant nausea and vomiting   Complete by: As directed    Call MD for:  severe uncontrolled pain   Complete by: As directed    Call MD for:  temperature >100.4   Complete by: As directed    Diet general   Complete by: As directed    Discharge instructions   Complete by: As directed    Please review instructions on the discharge summary.  You were cared for by a hospitalist during your hospital stay. If you have any questions about your discharge medications or the care you received while you were in the hospital after you are  discharged, you can call the unit and asked to speak with the hospitalist on call if the hospitalist that took care of you is not available. Once you are discharged, your primary care physician will handle any further medical issues. Please note that NO REFILLS for any discharge medications will be authorized once you are discharged, as it is imperative that you return to your primary care physician (or establish a relationship with a primary care physician if you do not have one) for your aftercare needs so that they can reassess your need for medications and monitor your lab values. If you do not have a primary care physician, you can call [phone] for a physician referral.   Increase activity slowly   Complete by: As directed          Allergies as of 06/06/2023       Reactions   Lidocaine Other (See Comments), Palpitations   Reaction not recalled   Tramadol Itching   Rifampin Nausea And Vomiting   vomiting Other Reaction(s): GI Intolerance vomiting    Adenosine    Metoprolol Other (See  Comments)   Reaction not recalled   Oxycodone Other (See Comments)   Overly-sedates the patient   Cefepime Nausea And Vomiting   Tolerates oxacillin Brand name for Maxipime Other Reaction(s): GI Intolerance        Medication List     STOP taking these medications    acetaminophen 325 MG tablet Commonly known as: TYLENOL   oxyCODONE 5 MG immediate release tablet Commonly known as: Oxy IR/ROXICODONE       TAKE these medications    amLODipine 10 MG tablet Commonly known as: NORVASC Take 1 tablet (10 mg total) by mouth daily.   apixaban 5 MG Tabs tablet Commonly known as: ELIQUIS Take 1 tablet (5 mg total) by mouth 2 (two) times daily.   FLUoxetine 20 MG capsule Commonly known as: PROZAC Take 20 mg by mouth daily.   folic acid 1 MG tablet Commonly known as: FOLVITE Take 1 tablet (1 mg total) by mouth daily.   guaifenesin 100 MG/5ML syrup Commonly known as: ROBITUSSIN Take 300 mg by mouth every 4 (four) hours as needed for cough.   hydrocortisone 25 MG suppository Commonly known as: ANUSOL-HC Place 25 mg rectally every 6 (six) hours as needed for hemorrhoids or anal itching.   magnesium hydroxide 400 MG/5ML suspension Commonly known as: MILK OF MAGNESIA Take 30 mLs by mouth daily as needed for mild constipation or moderate constipation.   magnesium oxide 400 (240 Mg) MG tablet Commonly known as: MAG-OX Take 400 mg by mouth daily.   metFORMIN 1000 MG tablet Commonly known as: GLUCOPHAGE Take 1,000 mg by mouth 2 (two) times daily with a meal.   metoprolol succinate 25 MG 24 hr tablet Commonly known as: TOPROL-XL Take 1 tablet (25 mg total) by mouth daily.   Nutritional Drink Liqd Take 1 Bottle by mouth 2 (two) times daily with a meal. Magic Cup; Give at lunch and supper   Nyamyc powder Generic drug: nystatin Apply topically.   ondansetron 4 MG tablet Commonly known as: ZOFRAN Take 4 mg by mouth every 4 (four) hours as needed for nausea or  vomiting.   pantoprazole 40 MG tablet Commonly known as: PROTONIX Take 40 mg by mouth daily.   polyethylene glycol 17 g packet Commonly known as: MIRALAX / GLYCOLAX Take 17 g by mouth daily. Start taking on: June 07, 2023   PROSTAT PO Take 30 mLs by  mouth daily.   senna-docusate 8.6-50 MG tablet Commonly known as: Senokot-S Take 2 tablets by mouth 2 (two) times daily.          Contact information for after-discharge care     Destination     [facility] [place] Preferred SNF .   Service: Skilled Nursing Contact information: [address] [place] [address] [phone]                     TOTAL DISCHARGE TIME: 35 minutes  Claude Swendsen Rito Ehrlich  [facility] Pager on [email or web address]  06/06/2023, 10:01 AM

## 2023-09-17 DIAGNOSIS — R160 Hepatomegaly, not elsewhere classified: Secondary | ICD-10-CM | POA: Insufficient documentation

## 2023-09-17 NOTE — Assessment & Plan Note (Signed)
 I had a discussion with Dr. Catha Clink and we are concerned this could be a liver cancer.  She will need a liver biopsy and this is the next step in her workup.  We went over her MRI report.  We will arrange.

## 2023-09-18 DEATH — deceased
